# Patient Record
Sex: Female | Born: 1970 | Race: White | Hispanic: No | Marital: Married | Smoking: Never smoker
Health system: Southern US, Community
[De-identification: ages and names within clinical notes are randomized; demographics above are authoritative.]

## PROBLEM LIST (undated history)

## (undated) DIAGNOSIS — G43909 Migraine, unspecified, not intractable, without status migrainosus: Secondary | ICD-10-CM

## (undated) DIAGNOSIS — E7800 Pure hypercholesterolemia, unspecified: Secondary | ICD-10-CM

## (undated) DIAGNOSIS — O139 Gestational [pregnancy-induced] hypertension without significant proteinuria, unspecified trimester: Secondary | ICD-10-CM

## (undated) DIAGNOSIS — F32A Depression, unspecified: Secondary | ICD-10-CM

## (undated) DIAGNOSIS — K219 Gastro-esophageal reflux disease without esophagitis: Secondary | ICD-10-CM

## (undated) DIAGNOSIS — R112 Nausea with vomiting, unspecified: Secondary | ICD-10-CM

## (undated) DIAGNOSIS — K529 Noninfective gastroenteritis and colitis, unspecified: Secondary | ICD-10-CM

## (undated) DIAGNOSIS — Z9889 Other specified postprocedural states: Secondary | ICD-10-CM

## (undated) DIAGNOSIS — M1990 Unspecified osteoarthritis, unspecified site: Secondary | ICD-10-CM

## (undated) DIAGNOSIS — F419 Anxiety disorder, unspecified: Secondary | ICD-10-CM

## (undated) DIAGNOSIS — Z973 Presence of spectacles and contact lenses: Secondary | ICD-10-CM

## (undated) HISTORY — DX: Migraine, unspecified, not intractable, without status migrainosus: G43.909

## (undated) HISTORY — DX: Noninfective gastroenteritis and colitis, unspecified: K52.9

## (undated) HISTORY — DX: Anxiety disorder, unspecified: F41.9

## (undated) HISTORY — PX: COLONOSCOPY: SHX174

## (undated) HISTORY — DX: Gestational (pregnancy-induced) hypertension without significant proteinuria, unspecified trimester: O13.9

## (undated) HISTORY — PX: CATARACT EXTRACTION: SUR2

## (undated) HISTORY — DX: Depression, unspecified: F32.A

## (undated) HISTORY — DX: Pure hypercholesterolemia, unspecified: E78.00

## (undated) HISTORY — PX: MOUTH SURGERY: SHX715

---

## 2001-10-08 ENCOUNTER — Other Ambulatory Visit: Admission: RE | Admit: 2001-10-08 | Discharge: 2001-10-08 | Payer: Self-pay | Admitting: Obstetrics and Gynecology

## 2002-06-02 ENCOUNTER — Inpatient Hospital Stay (HOSPITAL_COMMUNITY): Admission: EM | Admit: 2002-06-02 | Discharge: 2002-06-03 | Payer: Self-pay | Admitting: *Deleted

## 2002-06-02 ENCOUNTER — Encounter: Payer: Self-pay | Admitting: *Deleted

## 2002-06-03 ENCOUNTER — Encounter: Payer: Self-pay | Admitting: Neurology

## 2002-06-03 ENCOUNTER — Encounter (INDEPENDENT_AMBULATORY_CARE_PROVIDER_SITE_OTHER): Payer: Self-pay | Admitting: Cardiology

## 2002-10-14 ENCOUNTER — Other Ambulatory Visit: Admission: RE | Admit: 2002-10-14 | Discharge: 2002-10-14 | Payer: Self-pay | Admitting: Obstetrics and Gynecology

## 2003-10-19 ENCOUNTER — Other Ambulatory Visit: Admission: RE | Admit: 2003-10-19 | Discharge: 2003-10-19 | Payer: Self-pay | Admitting: Obstetrics and Gynecology

## 2004-08-01 ENCOUNTER — Encounter (INDEPENDENT_AMBULATORY_CARE_PROVIDER_SITE_OTHER): Payer: Self-pay | Admitting: Specialist

## 2004-08-01 ENCOUNTER — Ambulatory Visit (HOSPITAL_COMMUNITY): Admission: RE | Admit: 2004-08-01 | Discharge: 2004-08-01 | Payer: Self-pay | Admitting: Gastroenterology

## 2004-12-16 ENCOUNTER — Other Ambulatory Visit: Admission: RE | Admit: 2004-12-16 | Discharge: 2004-12-16 | Payer: Self-pay | Admitting: Obstetrics and Gynecology

## 2005-10-22 ENCOUNTER — Encounter (INDEPENDENT_AMBULATORY_CARE_PROVIDER_SITE_OTHER): Payer: Self-pay | Admitting: *Deleted

## 2005-10-22 ENCOUNTER — Inpatient Hospital Stay (HOSPITAL_COMMUNITY): Admission: AD | Admit: 2005-10-22 | Discharge: 2005-10-25 | Payer: Self-pay | Admitting: Gynecology

## 2005-12-05 ENCOUNTER — Other Ambulatory Visit: Admission: RE | Admit: 2005-12-05 | Discharge: 2005-12-05 | Payer: Self-pay | Admitting: Gynecology

## 2005-12-15 HISTORY — PX: INTRAUTERINE DEVICE INSERTION: SHX323

## 2006-12-21 ENCOUNTER — Other Ambulatory Visit: Admission: RE | Admit: 2006-12-21 | Discharge: 2006-12-21 | Payer: Self-pay | Admitting: Gynecology

## 2007-12-27 ENCOUNTER — Other Ambulatory Visit: Admission: RE | Admit: 2007-12-27 | Discharge: 2007-12-27 | Payer: Self-pay | Admitting: Gynecology

## 2007-12-27 ENCOUNTER — Ambulatory Visit: Payer: Self-pay | Admitting: Gynecology

## 2007-12-27 ENCOUNTER — Encounter: Payer: Self-pay | Admitting: Gynecology

## 2008-01-27 ENCOUNTER — Ambulatory Visit: Payer: Self-pay | Admitting: Gynecology

## 2009-01-08 ENCOUNTER — Ambulatory Visit: Payer: Self-pay | Admitting: Gynecology

## 2009-01-08 ENCOUNTER — Other Ambulatory Visit: Admission: RE | Admit: 2009-01-08 | Discharge: 2009-01-08 | Payer: Self-pay | Admitting: Gynecology

## 2009-01-08 ENCOUNTER — Encounter: Payer: Self-pay | Admitting: Gynecology

## 2009-01-22 ENCOUNTER — Encounter: Admission: RE | Admit: 2009-01-22 | Discharge: 2009-01-22 | Payer: Self-pay | Admitting: Gynecology

## 2010-01-11 ENCOUNTER — Ambulatory Visit: Payer: Self-pay | Admitting: Gynecology

## 2010-01-11 ENCOUNTER — Other Ambulatory Visit: Admission: RE | Admit: 2010-01-11 | Discharge: 2010-01-11 | Payer: Self-pay | Admitting: Gynecology

## 2010-02-24 ENCOUNTER — Encounter
Admission: RE | Admit: 2010-02-24 | Discharge: 2010-02-24 | Payer: Self-pay | Source: Home / Self Care | Attending: Gynecology | Admitting: Gynecology

## 2010-07-29 NOTE — H&P (Signed)
Rebekah Silva, Rebekah Silva               ACCOUNT NO.:  [id number]   MEDICAL RECORD NO.:  [id number]          PATIENT TYPE:  INP   LOCATION:  9198                          FACILITY:  [facility]   PHYSICIAN:  Juan H. Toney Rakes, M.D.DATE OF BIRTH:  [birth year]   DATE OF ADMISSION:  10/22/2005  DATE OF DISCHARGE:                                HISTORY & PHYSICAL   CHIEF COMPLAINT:  1. Questionable rupture of membranes.  2. Labile pregnancy-induced hypertension.   HISTORY:  The patient is a 40 year old gravida 1 para 0 with date of  confinement of October 22, 2005, currently [redacted] weeks gestation.  The patient  presented to the office today questioning whether she had ruptured membranes  or not.  She felt wet last night and this morning.  She was kept in  maternity admission.  Her Nitrazine and ferning were negative.  She had a  reassuring fetal heart rate tracing and mild uterine irritability, but was  found to have a blood pressure of 145/95 and repeated on several occasions  it was in the 546T and the diastolic was into the 03T.  The patient on a  prenatal visit and had fluctuations in her blood pressure and in the  emergency room she had 1+ pitting edema with DTR 1+ to 2+.  The patient  denied any visual disturbances or headaches.  Her CBC and __________  panels  were within normal limits and her urine did not demonstrate any evidence of  proteinuria.  The patient with known history of ulcerative colitis and takes  Asacol daily which is a category B in her pregnancy.  The patient had an  ultrasound at [redacted] weeks gestation, whereby the fetus at that point was  measuring 6-1/2 pounds.  She had a questionable narrow pelvis on pelvimetry.  The patient would like to try natural childbirth.  She will be admitted and  undergo induction.  Her cervix was long, closed, and posterior.  We will  place the Cervidil starting this afternoon for 12 hours followed by pitocin  drip high dose.   PRENATAL COURSE:  The  patient with a history of ulcerative colitis takes  Asacol daily.  She is on prenatal vitamins.  She also had iron-deficiency  anemia for which she was taken iron tablets.  The patient received RhoGAM at  [redacted] weeks gestation.  Sister with history of Down syndrome, the patient had a  normal first trimester screen and __________ fetoprotein but declined an  amniocentesis.  She had a normal cystic fibrosis screening.  The remainder  of her pregnancy was essentially unremarkable with the exception of labor in  the third trimester with labile PIH.  GBS culture was negative.   REVIEW OF SYSTEMS:  The __________ .   PHYSICAL EXAMINATION:  VITAL SIGNS:  Blood pressure was 145/95, 132/80 in  labor and delivery.  Temperature 98.5, pulse 93, respirations 20.  HEENT:  Unremarkable.  NECK:  Supple.  Trachea midline.  No carotid bruits.  No thyromegaly.  LUNGS:  Clear to auscultation without any rhonchi or wheezes.  HEART:  Regular rate and rhythm.  No murmurs or gallops.  BREASTS EXAM:  Not done.  ABDOMEN:  Gravid uterus, vertex presentation by [employer].  PELVIC EXAM:  Cervix long, closed, and posterior.  Negative Nitrazine,  negative ferning.  No fluid in the vaginal vault.  EXTREMITIES:  1+, DTRs 1+ to 2+, negative __________ .   PRENATAL LABS:  B negative blood type.  VDRL was nonreactive.  Rubella  immune.  Hepatitis C surface antigen and HIV were negative.  Alpha-  fetoprotein was normal.  Diabetes screen was normal.  First trimester screen  was normal.  Cystic fibrosis screening was normal for the majority of the  mutation screen.  The patient declined an amniocentesis.  GBS culture was  negative.   ASSESSMENT:  A 40 year old gravida 1, para 0 at [redacted] weeks gestation.  We  thought that she had ruptured her membranes.  Nitrazine and ferning were  negative.  Cervix long closed and posterior.  The patient now at [redacted] weeks  gestation with labile PIH, normal PIH labs in the emergency room,  reassuring  fetal heart rate tracing.  Uterine irritability noted.  The patient will be  admitted.  Will place Cervidil around the cervix for cervical ripening for a  12-hour period, followed by Pitocin augmentation.  The risks, benefits, and  pros-and-cons were discussed with the patient.  Her GBS culture was  negative.   PLAN:  As per assessment above.      Juan H. Toney Rakes, M.D.  Electronically Signed     JHF/MEDQ  D:  10/22/2005  T:  10/22/2005  Job:  [id number]

## 2010-07-29 NOTE — Op Note (Signed)
Rebekah Silva, Rebekah Silva               ACCOUNT NO.:  [id number]   MEDICAL RECORD NO.:  [id number]          PATIENT TYPE:  AMB   LOCATION:  ENDO                         FACILITY:  [facility]   PHYSICIAN:  Jeryl Columbia, M.D.    DATE OF BIRTH:  06-05-1970   DATE OF PROCEDURE:  08/01/2004  DATE OF DISCHARGE:                                 OPERATIVE REPORT   PROCEDURE:  Colonoscopy with biopsy.   INDICATION:  __________ increased colitis symptoms not responding to the  usual medicines.  Consent was signed after risks, benefits, methods, options  thoroughly discussed in the office.   MEDICINES USED:  Demerol 100, Versed 10.   PROCEDURE:  Rectal inspection was pertinent for external hemorrhoids.  Small  digital exam is negative.  Video pediatric adjustable colonoscope was  inserted and easily advanced around the colon to the cecum.  On insertion,  distal rectal was spared.  The left side to about the distal transverse was  mild to moderately inflamed.  The right side was normal.  Advancing to cecum  did not require any abdominal pressure or any position changes.  The scope  was inserted a short ways into the terminal ileum, which was normal.  Photo  documentation was obtained.  A few scattered GI biopsies were obtained and  put in the first container.  A few scattered right-sided normal biopsies  were obtained and put in the second container.  The prep was adequate.  There was some liquid stool that required suctioning only.  The scope was  withdrawn around the left side.  Mild to moderate inflammation was confirmed  and a few scattered biopsies were obtained and put in the third container.  Anal rectal pull-through and retroflexion back in the rectum confirmed the  normal distal rectum but mildly involved proximal rectum.  There were some  small hemorrhoids.  The scope was straightened and re-advanced a short ways  up the left side of the colon.  Air was suctioned.  Scope removed.  The  patient  tolerated the procedure well.  There was no obvious immediate  complication.   ENDOSCOPIC DIAGNOSES:  1.  Internal/external small hemorrhoids.  2.  Rectal sparing distally.  3.  Left-sided colitis to the distal transverse status post biopsy, mild to      moderate.  4.  Otherwise within normal limits to the terminal ileum status post biopsy      in both the TI and the right-sided normal appearing colon.   PLAN:  Probably need to start prednisone.  Will begin 20, then based on how  she does can increase or decrease.  Might consider enemas in the future.  Go  ahead and get a CBC today to see if she needs more iron, etc., and decide  followup based on how she is doing on the prednisone and when we review the  biopsies.      MEM/MEDQ  D:  08/01/2004  T:  08/01/2004  Job:  [id number]   cc:   Emeline General. White, M.D.  [place] [place]  [address]  Fax: [phone]

## 2010-07-29 NOTE — Discharge Summary (Signed)
[name], Rebekah Silva               ACCOUNT NO.:  [id number]   MEDICAL RECORD NO.:  [id number]          PATIENT TYPE:  INP   LOCATION:  9126                          FACILITY:  [facility]   PHYSICIAN:  Juan H. Toney Rakes, M.D.DATE OF BIRTH:  1970/05/14   DATE OF ADMISSION:  10/22/2005  DATE OF DISCHARGE:                                 DISCHARGE SUMMARY   HISTORY:  The patient is a 40 year old gravida 1, para 0 who was admitted on  August 12 with the diagnosis of questionable rupture of membranes and labile  pregnancy induced hypertension.  The patient was subsequently taken for  primary cesarean section secondary to prolonged rupture of membranes and  suspected chorioamnionitis and persistent fetal tachycardia during the first  stage of labor.  The patient delivered a viable female infant, Apgars of  08/09, arterial cord pH was 7.20, weight of 6 pounds 7 ounces.  Nuchal cord  x2 was noted as well as a true knot.  The patient had normal PIH labs  preoperatively as well as postoperatively and she had been asymptomatic as  well.  Her postoperative day #1 hemoglobin was 12.3, platelet count 280,000,  blood type was B negative.  28 Rh was negative as well and maternal  rubella immunity was present.  The patient continued to do well with the  exception that her blood pressures ranged from 540 to 981 systolic and  diastolics were running in the 90-98 range.  She had 1+ pitting edema.  DTR  1+ but no clonus, no right upper quadrant pain.  Her incision was intact.  She continued to ambulate and was voiding well and tolerating regular diet  well, passing gas and she was ready to be discharged home.  She was started  on labetalol 100 mg b.i.d., took 1 tablet before discharge from the  hospital.  She will return to the office in another week to have her staples  removed and for blood pressure check.   FINAL DIAGNOSES:  1. Postdate pregnancy.  2. Prolonged rupture of membrane.  3. Suspected  chorioamnionitis.  4. Persistent fetal tachycardia  5. Labile pregnancy induced hypertension.   FINAL DISPOSITION:  The patient was ready to be discharged home on the third  postoperative day.  She was started on labetalol 100 mg p.o. b.i.d. Her  incision was intact.  She was up and ambulating, tolerating regular diet  well scheduled to return to the office in a week to have her staples removed  and for blood pressure monitoring, signs and symptoms of preeclampsia  superimposed on chronic hypertension was discussed.  She was given a  prescription of Darvocet to take one p.o. q.4-6 h p.r.n. pain and she will  continue prenatal vitamins as well.      Juan H. Toney Rakes, M.D.  Electronically Signed     JHF/MEDQ  D:  10/25/2005  T:  10/25/2005  Job:  [id number]

## 2010-07-29 NOTE — Op Note (Signed)
Rebekah Silva, Rebekah Silva               ACCOUNT NO.:  [id number]   MEDICAL RECORD NO.:  [id number]          PATIENT TYPE:  INP   LOCATION:  9126                          FACILITY:  [facility]   PHYSICIAN:  Juan H. Toney Rakes, M.D.DATE OF BIRTH:  04/20/1970   DATE OF PROCEDURE:  10/22/2005  DATE OF DISCHARGE:                                 OPERATIVE REPORT   SURGEON:  Juan H. Toney Rakes, M.D.   INDICATIONS FOR OPERATION:  A 40 year old gravida 1, para 0, at 65 weeks'  gestation is being taken to the operating room secondary to postdates with  prolonged rupture of membranes, suspected chorioamnionitis, persistent fetal  tachycardia, in the early first stage of labor.   PREOPERATIVE DIAGNOSES:  1. Post date pregnancy.  2. Prolonged rupture membranes.  3. Suspected chorioamnionitis.  4. Oligohydramnios, suspected.  5. Persistent fetal tachycardia (early first stage of labor).   POSTOPERATIVE DIAGNOSES:  1. Post date pregnancy.  2. Prolonged rupture membranes.  3. Suspected chorioamnionitis.  4. Oligohydramnios, suspected.  5. Persistent fetal tachycardia (early first stage of labor).  6. Meconium-stained amniotic fluid.  7. Nuchal cord x2, and a true knot.   ANESTHESIA:  Spinal.   PROCEDURE PERFORMED:  Primary lower uterine segment transverse cesarean  section.   FINDINGS:  Meconium stained amniotic fluid, although minimal; female at Apgars  of 8 and 8; arterial cord pH 7.20; weight 6 pounds 7 ounces; nuchal cord x2  along with a true knot; normal maternal pelvic anatomy.   DESCRIPTION OF OPERATION:  After the patient was adequately counseled, she  was taken to the operating room where she underwent a successful spinal  anesthesia.  A Foley catheter was placed for monitoring of the urinary  output.  The abdomen was prepped and draped in the usual sterile fashion.  A  Pfannenstiel skin incision was made 2 cm above the symphysis pubis.  The  incision was carried down from the skin, through  the subcutaneous tissue,  down to rectus fascia; whereby a midline nick was made.  The fascia was  incised in a transverse fashion.  The midline raphe was entered.   The peritoneal cavity was entered cautiously; and the bladder flap was  established.  The lower uterine segment was incised in a transverse fashion.  Meconium-stained amniotic fluid was present.  The newborn's head was  delivered after the nuchal cord x2 was reduced; and, at this point, then it  was noted also that it was a true knot.  The nasopharyngeal area was DeLee  suctioned with no meconium fluid suctioned.  The newborn was delivered.  The  cord was doubly clamped and excised.  The newborn gave a cry and was shown  to the parents; and passed off to the neonatologist who gave the above-  mentioned parameters.  After cord blood was obtained.  The placenta was  delivered from the intrauterine cavity.  The patient was then donating her  cord blood to public cord banking; and then afterwards the placenta and cord  was first to be submitted to pathology for histological evaluation.   Pitocin drip was started.  The patient had received clindamycin 900 mg IV  since she is allergic to PENICILLIN.  The uterus was exteriorized.  The  intrauterine cavity was swept clear of remaining products of conception.  The intrauterine cavity was irrigated with normal saline solution.  The  transverse incision was closed in a two-layer fashion.  The first with an  interlocking stitch of #0 Vicryl suture, followed by an imbricating layer  with a similar suture material.  The uterus was placed back in the pelvic  cavity.  The pelvic cavity was copiously irrigated with normal saline  solution.  Sponge count and needle count were correct.  The visceral  peritoneum was now reapproximated; and the rectus fascia was closed with a  running stitch of #0 Vicryl suture.  The subcutaneous bleeders were Bovie  cauterized and the skin was reapproximated  with staples followed by placing  Xeroform gauze and 4 x 4 dressing.  The patient was transferred to recovery  room with stable vital signs.  Blood loss for the procedure was recorded at  700 mL.  IV fluid 2300 mL of lactated Ringer's; and urine output was 200 mL  and clear.      Juan H. Toney Rakes, M.D.  Electronically Signed     JHF/MEDQ  D:  10/22/2005  T:  10/23/2005  Job:  [id number]

## 2010-07-29 NOTE — H&P (Signed)
[name], Rebekah Silva                           ACCOUNT NO.:  [id number]   MEDICAL RECORD NO.:  [id number]                   PATIENT TYPE:  EMS   LOCATION:  MAJO                                 FACILITY:  [facility]   PHYSICIAN:  Larey Seat, M.D.               DATE OF BIRTH:  07-31-1970   DATE OF ADMISSION:  06/02/2002  DATE OF DISCHARGE:                                HISTORY & PHYSICAL   HISTORY OF PRESENT ILLNESS:  This is an obese 40 year old right-handed  married female with a birth date 09/07/1970. She is presenting to the  ER at [facility] after having  had 3 episodes of dysarthria, right-  sided facial drooling, numbness feeling and neck pain as well as with a  third episode some arm tingling numbness. 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   She said the 3rd episode happened around the time when she drove home, and  she therefore decided to pick her husband up who then drove her to the  hospital. This episode had also slurred speech associated with it and right  arm tingling and facial numbness. She also noticed hypersalivation and had  trouble too handling secretions. Here she suffered a very severe left-sided  headache just 10 minutes ago after I had admitted her to the stroke service  for a TIA workup, which makes the differential diagnosis of migraine a  possibility.   PAST MEDICAL HISTORY:  1. Hypertension.  2. Hypercholesteremia.  3. Obesity.  4. Occasional sharp headaches that have not had migrainous character, last     only seconds and are not associated with nausea or photophobia.   MEDICATIONS:  The patient does not take any regular medication except  hormonal birth control pills. She has not taken aspirin or multivitamins.   SOCIAL HISTORY:  She is married, a Engineer, civil (consulting) and  middle school. She has no children yet  and denies any use of alcohol,  nicotine or illegal drugs.   FAMILY HISTORY:  She states that her family history is positive for  hypertension and cholesterolemia, but she is not aware of any heart  condition or strokes or hypercoagulable disease.   REVIEW OF SYSTEMS:  She has recently started the Atkins diet and has lost 25  pounds in the last 4 months, and she also believes that it had benefitted  her cholesterol levels, although she does not recall the numbers. Mental  status, the patient is alert and cooperative, very pleasant.   PHYSICAL EXAMINATION:  HEENT:  Her general physical examination shows the  mucous membranes to be well perfused. Pupils equally round and reactive to  light and accommodation. Normocephalic, atraumatic skull.  VITAL SIGNS:  Blood pressure 103/70, heart rate 68, respiratory rate 16.  LUNGS:  Clear to auscultation.  ABDOMEN:  Soft, nontender, nondistended. Positive bowel sounds.  EXTREMITIES:  No peripheral edema, no pitting edema. No clubbing or  cyanosis.  SKIN:  No rash, no hypo or hyperpigmentation, no dysarthria, apraxia,  dysphagia.  NEUROLOGIC:  Cranial nerves:  Pupils reactive equally to light and  accommodation. Extraocular movements full. Optokinetic responses intact.  Equal facial strength and sensory. No deviation of tongue or uvula. No  carotid bruits.  MOTOR EXAMINATION:  Equal strength tone and mass bilaterally with 2+ deep  tendon reflexes, downgoing toes to plantar stimulation. No clonus and no  pronator drift. The fine motor skills are preserved. The patient has equal  primary and cortical sensory. Her finger-to-nose test was intact without  tremor, ataxia or dysmetria.   ASSESSMENT:  Either transient ischemic attack or migraine with an entirely  new  manifestation for this patient, who never suffered from migrainous  headaches before.   PLAN:  Admit for MRI with MRA. Add aspirin 325 mg. Start multivitamin with  folic acid and a low  cholesterol diet. I will review result of the MRI/MRA  of the brain and MRA of the neck to see if there is the necessity to do any  carotid Doppler studies which I trust the patient's age and her absence of  any carotid bruits is probably not likely. A 2D echocardiogram might also  not be necessary. I have discussed with her the possibility of migraine  prophylaxis as she states that she has headaches, nonmigrainous about every  14 days, and we might be able to control these kind of headaches with  preventive medication.                                               Larey Seat, M.D.    CD/MEDQ  D:  06/02/2002  T:  06/03/2002  Job:  [id number]

## 2011-01-10 ENCOUNTER — Encounter: Payer: Self-pay | Admitting: Anesthesiology

## 2011-01-13 ENCOUNTER — Other Ambulatory Visit (HOSPITAL_COMMUNITY)
Admission: RE | Admit: 2011-01-13 | Discharge: 2011-01-13 | Disposition: A | Discharge status: Home / Self Care | Payer: BC Managed Care – PPO | Source: Ambulatory Visit | Attending: Gynecology | Admitting: Gynecology

## 2011-01-13 ENCOUNTER — Ambulatory Visit (INDEPENDENT_AMBULATORY_CARE_PROVIDER_SITE_OTHER): Payer: BC Managed Care – PPO | Admitting: Gynecology

## 2011-01-13 ENCOUNTER — Encounter: Payer: Self-pay | Admitting: Gynecology

## 2011-01-13 VITALS — BP 128/84 | Ht 66.5 in | Wt 286.0 lb

## 2011-01-13 DIAGNOSIS — Z01419 Encounter for gynecological examination (general) (routine) without abnormal findings: Secondary | ICD-10-CM

## 2011-01-13 DIAGNOSIS — E7800 Pure hypercholesterolemia, unspecified: Secondary | ICD-10-CM

## 2011-01-13 DIAGNOSIS — E663 Overweight: Secondary | ICD-10-CM

## 2011-01-13 DIAGNOSIS — Z23 Encounter for immunization: Secondary | ICD-10-CM

## 2011-01-13 NOTE — Progress Notes (Signed)
Addended by: Su Grand A on: 01/13/2011 03:23 PM   Modules accepted: Orders

## 2011-01-13 NOTE — Progress Notes (Signed)
Rebekah Silva 1970-07-31 [id number]   History:    40 y.o.  for annual exam and also she is due to have her Mirena IUD changed since it was placed in 2007. She does her monthly self breast examination she is due for a mammogram next month. She's been followed by Dr. Harlan Stains her primary physician who is been monitoring her hypercholesterolemia. She scheduled to see her at the end of this month and labs will be drawn at her office. She's having very light if any cycles and all the Mirena IUD has worked well for her. She continues to be overweight with a BMI over 35. In 2000 and and 7 a bone density study had been done because she had history of ulcerative colitis and had been on chronic steroids for a while and her bone mineral density study was normal. She is no longer on any chronic steroids. She is taking her calcium and vitamin D once a day.  Past medical history,surgical history, family history and social history were all reviewed and documented in the EPIC chart.  ROS:  Was performed and pertinent positives and negatives are included in the history.  Exam: chaperone present BP 128/84  Ht 5' 6.5" (1.689 m)  Wt 286 lb (129.729 kg)  BMI 45.47 kg/m2  Body mass index is 45.47 kg/(m^2).  General appearance : Well developed well nourished female. No acute distress HEENT: Neck supple, trachea midline, no carotid bruits, no thyroidmegaly Lungs: Clear to auscultation, no rhonchi or wheezes, or rib retractions  Heart: Regular rate and rhythm, no murmurs or gallops Breast:Examined in sitting and supine position were symmetrical in appearance, no palpable masses or tenderness,  no skin retraction, no nipple inversion, no nipple discharge, no skin discoloration, no axillary or supraclavicular lymphadenopathy Abdomen: no palpable masses or tenderness, no rebound or guarding Extremities: no edema or skin discoloration or tenderness  Pelvic:  Bartholin, Urethra, Skene Glands: Within normal  limits             Vagina: No gross lesions or discharge  Cervix: No gross lesions or discharge IUD string seen  Uterus   anteverted, normal size, shape and consistency, non-tender and mobile  Adnexa  Without masses or tenderness  Anus and perineum  normal   Rectovaginal  normal sphincter tone without palpated masses or tenderness             Hemoccult not done     Assessment/Plan:  40 y.o. female for annual exam will schedule followup visit next week to replace her Mirena IUD with a new one. She will followup with Dr. Harlan Stains for medical exam lab work. We discussed importance of exercise 3-4 times a week. She requested to have the flu shot today which will be administered after consent form signed. She was encouraged to continue her monthly self breast examination. I have requested her to increase her calcium and vitamin D to twice a day. Pap smear was done today.    Terrance Mass MD, 2:42 PM 01/13/2011

## 2011-01-16 ENCOUNTER — Other Ambulatory Visit: Payer: Self-pay | Admitting: Gynecology

## 2011-01-16 ENCOUNTER — Other Ambulatory Visit: Payer: Self-pay | Admitting: *Deleted

## 2011-01-16 DIAGNOSIS — Z1231 Encounter for screening mammogram for malignant neoplasm of breast: Secondary | ICD-10-CM

## 2011-01-16 DIAGNOSIS — Z3049 Encounter for surveillance of other contraceptives: Secondary | ICD-10-CM

## 2011-01-16 MED ORDER — LEVONORGESTREL 20 MCG/24HR IU IUD: INTRAUTERINE_SYSTEM | Freq: Once | INTRAUTERINE | Status: DC

## 2011-01-16 NOTE — Progress Notes (Signed)
Patient informed benefits Mirena remove/insert $30 copay.  Patient scheduled.

## 2011-01-23 ENCOUNTER — Ambulatory Visit (INDEPENDENT_AMBULATORY_CARE_PROVIDER_SITE_OTHER): Payer: BC Managed Care – PPO | Admitting: Gynecology

## 2011-01-23 ENCOUNTER — Encounter: Payer: Self-pay | Admitting: Gynecology

## 2011-01-23 VITALS — BP 110/78

## 2011-01-23 DIAGNOSIS — Z3049 Encounter for surveillance of other contraceptives: Secondary | ICD-10-CM

## 2011-01-23 NOTE — Progress Notes (Signed)
Patient presented to the office today to remove her IUD (Mirena) his pin 5 years. Patient was seen in the office recently for her annual exam her Pap smear was normal. Patient read that printout information the Mirena IUD consent form signed patient aware that this form of contraception is good for 5 years is 99% effective.  Procedure note: Bimanual examination demonstrated upper limits of normal anteverted uterus no palpable adnexal masses. The cervix was cleansed with Betadine solution. The IUD string was grasped with a [place] clamp and the IUD was retrieved shown to the patient and discarded. A single-tooth tenaculum was then placed on the anterior cervical lip the uterus sounded to 8 cm and the Mirena IUD was placed in sterile fashion and a single-tooth tenaculum was removed. Patient tolerated procedure well. Patient will return back in one month for followup.

## 2011-02-20 ENCOUNTER — Ambulatory Visit: Payer: BC Managed Care – PPO | Admitting: Gynecology

## 2011-02-21 ENCOUNTER — Ambulatory Visit: Payer: BC Managed Care – PPO | Admitting: Gynecology

## 2011-02-24 ENCOUNTER — Encounter: Payer: Self-pay | Admitting: Gynecology

## 2011-02-24 ENCOUNTER — Ambulatory Visit (INDEPENDENT_AMBULATORY_CARE_PROVIDER_SITE_OTHER): Payer: BC Managed Care – PPO | Admitting: Gynecology

## 2011-02-24 VITALS — BP 128/84

## 2011-02-24 DIAGNOSIS — Z30431 Encounter for routine checking of intrauterine contraceptive device: Secondary | ICD-10-CM

## 2011-02-24 NOTE — Patient Instructions (Signed)
Will see you next year. Remember to do your monthly breast exams. Happy Holidays!

## 2011-02-24 NOTE — Progress Notes (Signed)
a patient 40 year old gravida 1 para 1 presented to the office today for one month followup after having placed a Mirena IUD which had been changed since the previous one had expired. She's having no complaints.  Exam: Abdomen: Soft nontender no rebound or guarding Pelvic: Bartholin urethra Skene was within normal limits Vagina: No gross lesions on inspection or discharge Cervix: IUD string seen Uterus: Anteverted normal size shape and consistency adnexa: No masses or tenderness  IUD string was trimmed.  Assessment/plan: One month status post placement of Mirena IUD. Patient fully where this form of contraception is good for 5 years and is 99% effective. Doing well will followup in one year or when necessary.

## 2011-03-08 ENCOUNTER — Ambulatory Visit
Admission: RE | Admit: 2011-03-08 | Discharge: 2011-03-08 | Disposition: A | Discharge status: Home / Self Care | Payer: BC Managed Care – PPO | Source: Ambulatory Visit | Attending: Gynecology | Admitting: Gynecology

## 2011-03-08 DIAGNOSIS — Z1231 Encounter for screening mammogram for malignant neoplasm of breast: Secondary | ICD-10-CM

## 2012-04-04 ENCOUNTER — Other Ambulatory Visit: Payer: Self-pay | Admitting: Gastroenterology

## 2012-08-20 ENCOUNTER — Encounter: Payer: Self-pay | Admitting: Gynecology

## 2012-08-20 ENCOUNTER — Ambulatory Visit (INDEPENDENT_AMBULATORY_CARE_PROVIDER_SITE_OTHER): Payer: BC Managed Care – PPO | Admitting: Gynecology

## 2012-08-20 VITALS — BP 126/82 | Ht 66.0 in | Wt 281.0 lb

## 2012-08-20 DIAGNOSIS — IMO0002 Reserved for concepts with insufficient information to code with codable children: Secondary | ICD-10-CM

## 2012-08-20 DIAGNOSIS — K5190 Ulcerative colitis, unspecified, without complications: Secondary | ICD-10-CM

## 2012-08-20 DIAGNOSIS — Z01419 Encounter for gynecological examination (general) (routine) without abnormal findings: Secondary | ICD-10-CM

## 2012-08-20 NOTE — Patient Instructions (Addendum)
Tetanus, Diphtheria, Pertussis (Tdap) Vaccine What You Need to Know WHY GET VACCINATED? Tetanus, diphtheria and pertussis can be very serious diseases, even for adolescents and adults. Tdap vaccine can protect [place] from these diseases. TETANUS (Lockjaw) causes painful muscle tightening and stiffness, usually all over the body.  It can lead to tightening of muscles in the head and neck so you can't open your mouth, swallow, or sometimes even breathe. Tetanus kills about 1 out of 5 people who are infected. DIPHTHERIA can cause a thick coating to form in the back of the throat.  It can lead to breathing problems, paralysis, heart failure, and death. PERTUSSIS (Whooping Cough) causes severe coughing spells, which can cause difficulty breathing, vomiting and disturbed sleep.  It can also lead to weight loss, incontinence, and rib fractures. Up to 2 in 100 adolescents and 5 in 100 adults with pertussis are hospitalized or have complications, which could include pneumonia and death. These diseases are caused by bacteria. Diphtheria and pertussis are spread from person to person through coughing or sneezing. Tetanus enters the body through cuts, scratches, or wounds. Before vaccines, the [place] saw as many as 200,000 cases a year of diphtheria and pertussis, and hundreds of cases of tetanus. Since vaccination began, tetanus and diphtheria have dropped by about 99% and pertussis by about 80%. TDAP VACCINE Tdap vaccine can protect adolescents and adults from tetanus, diphtheria, and pertussis. One dose of Tdap is routinely given at age 19 or 11. People who did not get Tdap at that age should get it as soon as possible. Tdap is especially important for health care professionals and anyone having close contact with a baby younger than 12 months. Pregnant women should get a dose of Tdap during every pregnancy, to protect the newborn from pertussis. Infants are most at risk for severe, life-threatening  complications from pertussis. A similar vaccine, called Td, protects from tetanus and diphtheria, but not pertussis. A Td booster should be given every 10 years. Tdap may be given as one of these boosters if you have not already gotten a dose. Tdap may also be given after a severe cut or burn to prevent tetanus infection. Your doctor can give you more information. Tdap may safely be given at the same time as other vaccines. SOME PEOPLE SHOULD NOT GET THIS VACCINE  If you ever had a life-threatening allergic reaction after a dose of any tetanus, diphtheria, or pertussis containing vaccine, OR if you have a severe allergy to any part of this vaccine, you should not get Tdap. Tell your doctor if you have any severe allergies.  If you had a coma, or long or multiple seizures within 7 days after a childhood dose of DTP or DTaP, you should not get Tdap, unless a cause other than the vaccine was found. You can still get Td.  Talk to your doctor if you:  have epilepsy or another nervous system problem,  had severe pain or swelling after any vaccine containing diphtheria, tetanus or pertussis,  ever had Guillain-Barr Syndrome (GBS),  aren't feeling well on the day the shot is scheduled. RISKS OF A VACCINE REACTION With any medicine, including vaccines, there is a chance of side effects. These are usually mild and go away on their own, but serious reactions are also possible. Brief fainting spells can follow a vaccination, leading to injuries from falling. Sitting or lying down for about 15 minutes can help prevent these. Tell your doctor if you feel dizzy or light-headed, or  have vision changes or ringing in the ears. Mild problems following Tdap (Did not interfere with activities)  Pain where the shot was given (about 3 in 4 adolescents or 2 in 3 adults)  Redness or swelling where the shot was given (about 1 person in 5)  Mild fever of at least 100.40F (up to about 1 in 25 adolescents or 1 in  100 adults)  Headache (about 3 or 4 people in 10)  Tiredness (about 1 person in 3 or 4)  Nausea, vomiting, diarrhea, stomach ache (up to 1 in 4 adolescents or 1 in 10 adults)  Chills, body aches, sore joints, rash, swollen glands (uncommon) Moderate problems following Tdap (Interfered with activities, but did not require medical attention)  Pain where the shot was given (about 1 in 5 adolescents or 1 in 100 adults)  Redness or swelling where the shot was given (up to about 1 in 16 adolescents or 1 in 25 adults)  Fever over 102F (about 1 in 100 adolescents or 1 in 250 adults)  Headache (about 3 in 20 adolescents or 1 in 10 adults)  Nausea, vomiting, diarrhea, stomach ache (up to 1 or 3 people in 100)  Swelling of the entire arm where the shot was given (up to about 3 in 100). Severe problems following Tdap (Unable to perform usual activities, required medical attention)  Swelling, severe pain, bleeding and redness in the arm where the shot was given (rare). A severe allergic reaction could occur after any vaccine (estimated less than 1 in a million doses). WHAT IF THERE IS A SERIOUS REACTION? What should I look for?  Look for anything that concerns you, such as signs of a severe allergic reaction, very high fever, or behavior changes. Signs of a severe allergic reaction can include hives, swelling of the face and throat, difficulty breathing, a fast heartbeat, dizziness, and weakness. These would start a few minutes to a few hours after the vaccination. What should I do?  If you think it is a severe allergic reaction or other emergency that can't wait, call 9-1-1 or get the person to the nearest hospital. Otherwise, call your doctor.  Afterward, the reaction should be reported to the "Vaccine Adverse Event Reporting System" (VAERS). Your doctor might file this report, or you can do it yourself through the VAERS web site at www.vaers.SamedayNews.es, or by calling 434-638-0084. VAERS is  only for reporting reactions. They do not give medical advice.  THE NATIONAL VACCINE INJURY COMPENSATION PROGRAM The National Vaccine Injury Compensation Program (VICP) is a federal program that was created to compensate people who may have been injured by certain vaccines. Persons who believe they may have been injured by a vaccine can learn about the program and about filing a claim by calling (503)811-4628 or visiting the [place] website at GoldCloset.com.ee. HOW CAN I LEARN MORE?  Ask your doctor.  Call your local or state health department.  Contact the Centers for Disease Control and Prevention (CDC):  Call 902 670 1668 or visit CDC's website at http://hunter.com/. CDC Tdap Vaccine VIS (07/20/11) Document Released: 08/29/2011 Document Revised: 11/22/2011 Document Reviewed: 08/29/2011 ExitCare Patient Information 2014 [place].

## 2012-08-20 NOTE — Progress Notes (Signed)
Rebekah Silva September 29, 1970 [id number]   History:    42 y.o.  for annual gyn exam with no complaints today. Patient had a Zane Herald IUD placement 2012 minutes done well. She has minimal cycles of any. Patient with history of ulcerative colitis is on chronic steroid. She had been on and off for 2 years. She was recently restarted on her glucocorticoid called Uceris for which she takes 9 mg daily. Patient had a normal bone density study in 2009. Patient does her monthly self breast examination and is currently taking calcium and vitamin D twice a day and is exercising regularly. Review of her records indicated she was weighing 286 pounds last year and is weighing 281 pounds this year. Patient would no prior history of abnormal Pap smears. Patient believes she received the Tdap vaccine last year.  Past medical history,surgical history, family history and social history were all reviewed and documented in the EPIC chart.  Gynecologic History No LMP recorded. Patient is not currently having periods (Reason: IUD). Contraception: IUD Last Pap: 2012. Results were: normal Last mammogram: none indicated. Results were: none indicated  Obstetric History OB History   Grav Para Term Preterm Abortions TAB SAB Ect Mult Living   1 1 1       1      # Outc Date GA Lbr Len/2nd Wgt Sex Del Anes PTL Lv   1 TRM     M CS   Yes       ROS: A ROS was performed and pertinent positives and negatives are included in the history.  GENERAL: No fevers or chills. HEENT: No change in vision, no earache, sore throat or sinus congestion. NECK: No pain or stiffness. CARDIOVASCULAR: No chest pain or pressure. No palpitations. PULMONARY: No shortness of breath, cough or wheeze. GASTROINTESTINAL: No abdominal pain, nausea, vomiting or diarrhea, melena or bright red blood per rectum. GENITOURINARY: No urinary frequency, urgency, hesitancy or dysuria. MUSCULOSKELETAL: No joint or muscle pain, no back pain, no recent trauma. DERMATOLOGIC:  No rash, no itching, no lesions. ENDOCRINE: No polyuria, polydipsia, no heat or cold intolerance. No recent change in weight. HEMATOLOGICAL: No anemia or easy bruising or bleeding. NEUROLOGIC: No headache, seizures, numbness, tingling or weakness. PSYCHIATRIC: No depression, no loss of interest in normal activity or change in sleep pattern.     Exam: chaperone present  BP 126/82  Ht 5' 6"  (1.676 m)  Wt 281 lb (127.461 kg)  BMI 45.38 kg/m2  Body mass index is 45.38 kg/(m^2).  General appearance : Well developed well nourished female. No acute distress HEENT: Neck supple, trachea midline, no carotid bruits, no thyroidmegaly Lungs: Clear to auscultation, no rhonchi or wheezes, or rib retractions  Heart: Regular rate and rhythm, no murmurs or gallops Breast:Examined in sitting and supine position were symmetrical in appearance, no palpable masses or tenderness,  no skin retraction, no nipple inversion, no nipple discharge, no skin discoloration, no axillary or supraclavicular lymphadenopathy Abdomen: no palpable masses or tenderness, no rebound or guarding Extremities: no edema or skin discoloration or tenderness  Pelvic:  Bartholin, Urethra, Skene Glands: Within normal limits             Vagina: No gross lesions or discharge  Cervix: No gross lesions or discharge, IUD string seen  Uterus  anteverted, normal size, shape and consistency, non-tender and mobile  Adnexa  Without masses or tenderness  Anus and perineum  normal   Rectovaginal  normal sphincter tone without palpated masses or tenderness  Hemoccult none indicated     Assessment/Plan:  42 y.o. female for annual exam who is doing well. She is currently being treated for chronic ulcerative colitis and is on steroid. For this reason I would like her to have a bone density study today to compare with the study of 2009. Patient was encouraged to continue to do a regular exercise and to continue to take her calcium and  vitamin D. No Pap smear was done today the new guidelines were discussed. Next year she will be her baseline mammogram.    Terrance Mass MD, 3:38 PM 08/20/2012

## 2012-08-21 ENCOUNTER — Encounter: Payer: Self-pay | Admitting: Gynecology

## 2012-09-12 ENCOUNTER — Ambulatory Visit (INDEPENDENT_AMBULATORY_CARE_PROVIDER_SITE_OTHER): Payer: BC Managed Care – PPO

## 2012-09-12 DIAGNOSIS — Z13820 Encounter for screening for osteoporosis: Secondary | ICD-10-CM

## 2012-09-12 DIAGNOSIS — IMO0002 Reserved for concepts with insufficient information to code with codable children: Secondary | ICD-10-CM

## 2012-09-12 DIAGNOSIS — K5190 Ulcerative colitis, unspecified, without complications: Secondary | ICD-10-CM

## 2013-12-31 ENCOUNTER — Encounter: Payer: BC Managed Care – PPO | Admitting: Women's Health

## 2013-12-31 ENCOUNTER — Ambulatory Visit (INDEPENDENT_AMBULATORY_CARE_PROVIDER_SITE_OTHER): Payer: BC Managed Care – PPO | Admitting: Gynecology

## 2013-12-31 ENCOUNTER — Encounter: Payer: Self-pay | Admitting: Gynecology

## 2013-12-31 ENCOUNTER — Other Ambulatory Visit (HOSPITAL_COMMUNITY)
Admission: RE | Admit: 2013-12-31 | Discharge: 2013-12-31 | Disposition: A | Discharge status: Home / Self Care | Payer: BC Managed Care – PPO | Source: Ambulatory Visit | Attending: Gynecology | Admitting: Gynecology

## 2013-12-31 VITALS — BP 128/88 | Ht 66.0 in | Wt 290.0 lb

## 2013-12-31 DIAGNOSIS — T8389XA Other specified complication of genitourinary prosthetic devices, implants and grafts, initial encounter: Secondary | ICD-10-CM

## 2013-12-31 DIAGNOSIS — Z01419 Encounter for gynecological examination (general) (routine) without abnormal findings: Secondary | ICD-10-CM | POA: Insufficient documentation

## 2013-12-31 DIAGNOSIS — N951 Menopausal and female climacteric states: Secondary | ICD-10-CM

## 2013-12-31 DIAGNOSIS — Z1151 Encounter for screening for human papillomavirus (HPV): Secondary | ICD-10-CM | POA: Insufficient documentation

## 2013-12-31 DIAGNOSIS — T8332XA Displacement of intrauterine contraceptive device, initial encounter: Secondary | ICD-10-CM

## 2013-12-31 DIAGNOSIS — G44031 Episodic paroxysmal hemicrania, intractable: Secondary | ICD-10-CM

## 2013-12-31 NOTE — Patient Instructions (Addendum)
Perimenopause Perimenopause is the time when your body begins to move into the menopause (no menstrual period for 12 straight months). It is a natural process. Perimenopause can begin 2-8 years before the menopause and usually lasts for 1 year after the menopause. During this time, your ovaries may or may not produce an egg. The ovaries vary in their production of estrogen and progesterone hormones each month. This can cause irregular menstrual periods, difficulty getting pregnant, vaginal bleeding between periods, and uncomfortable symptoms. CAUSES  Irregular production of the ovarian hormones, estrogen and progesterone, and not ovulating every month.  Other causes include:  Tumor of the pituitary gland in the brain.  Medical disease that affects the ovaries.  Radiation treatment.  Chemotherapy.  Unknown causes.  Heavy smoking and excessive alcohol intake can bring on perimenopause sooner. SIGNS AND SYMPTOMS   Hot flashes.  Night sweats.  Irregular menstrual periods.  Decreased sex drive.  Vaginal dryness.  Headaches.  Mood swings.  Depression.  Memory problems.  Irritability.  Tiredness.  Weight gain.  Trouble getting pregnant.  The beginning of losing bone cells (osteoporosis).  The beginning of hardening of the arteries (atherosclerosis). DIAGNOSIS  Your health care provider will make a diagnosis by analyzing your age, menstrual history, and symptoms. He or she will do a physical exam and note any changes in your body, especially your female organs. Female hormone tests may or may not be helpful depending on the amount of female hormones you produce and when you produce them. However, other hormone tests may be helpful to rule out other problems. TREATMENT  In some cases, no treatment is needed. The decision on whether treatment is necessary during the perimenopause should be made by you and your health care provider based on how the symptoms are affecting you  and your lifestyle. Various treatments are available, such as:  Treating individual symptoms with a specific medicine for that symptom.  Herbal medicines that can help specific symptoms.  Counseling.  Group therapy. HOME CARE INSTRUCTIONS   Keep track of your menstrual periods (when they occur, how heavy they are, how long between periods, and how long they last) as well as your symptoms and when they started.  Only take over-the-counter or prescription medicines as directed by your health care provider.  Sleep and rest.  Exercise.  Eat a diet that contains calcium (good for your bones) and soy (acts like the estrogen hormone).  Do not smoke.  Avoid alcoholic beverages.  Take vitamin supplements as recommended by your health care provider. Taking vitamin E may help in certain cases.  Take calcium and vitamin D supplements to help prevent bone loss.  Group therapy is sometimes helpful.  Acupuncture may help in some cases. SEEK MEDICAL CARE IF:   You have questions about any symptoms you are having.  You need a referral to a specialist (gynecologist, psychiatrist, or psychologist). SEEK IMMEDIATE MEDICAL CARE IF:   You have vaginal bleeding.  Your period lasts longer than 8 days.  Your periods are recurring sooner than 21 days.  You have bleeding after intercourse.  You have severe depression.  You have pain when you urinate.  You have severe headaches.  You have vision problems. Document Released: 04/06/2004 Document Revised: 12/18/2012 Document Reviewed: 09/26/2012 [facility] Patient Information 2015 [place], [place]. This information is not intended to replace advice given to you by your health care provider. Make sure you discuss any questions you have with your health care provider.   Headaches, Frequently Asked  Questions MIGRAINE HEADACHES Q: What is migraine? What causes it? How can I treat it? A: Generally, migraine headaches begin as a dull ache. Then  they develop into a constant, throbbing, and pulsating pain. You may experience pain at the temples. You may experience pain at the front or back of one or both sides of the head. The pain is usually accompanied by a combination of:  Nausea.  Vomiting.  Sensitivity to light and noise. Some people (about 15%) experience an aura (see below) before an attack. The cause of migraine is believed to be chemical reactions in the brain. Treatment for migraine may include over-the-counter or prescription medications. It may also include self-help techniques. These include relaxation training and biofeedback.  Q: What is an aura? A: About 15% of people with migraine get an "aura". This is a sign of neurological symptoms that occur before a migraine headache. You may see wavy or jagged lines, dots, or flashing lights. You might experience tunnel vision or blind spots in one or both eyes. The aura can include visual or auditory hallucinations (something imagined). It may include disruptions in smell (such as strange odors), taste or touch. Other symptoms include:  Numbness.  A "pins and needles" sensation.  Difficulty in recalling or speaking the correct word. These neurological events may last as long as 60 minutes. These symptoms will fade as the headache begins. Q: What is a trigger? A: Certain physical or environmental factors can lead to or "trigger" a migraine. These include:  Foods.  Hormonal changes.  Weather.  Stress. It is important to remember that triggers are different for everyone. To help prevent migraine attacks, you need to figure out which triggers affect you. Keep a headache diary. This is a good way to track triggers. The diary will help you talk to your healthcare professional about your condition. Q: Does weather affect migraines? A: Bright sunshine, hot, humid conditions, and drastic changes in barometric pressure may lead to, or "trigger," a migraine attack in some people. But  studies have shown that weather does not act as a trigger for everyone with migraines. Q: What is the link between migraine and hormones? A: Hormones start and regulate many of your body's functions. Hormones keep your body in balance within a constantly changing environment. The levels of hormones in your body are unbalanced at times. Examples are during menstruation, pregnancy, or menopause. That can lead to a migraine attack. In fact, about three quarters of all women with migraine report that their attacks are related to the menstrual cycle.  Q: Is there an increased risk of stroke for migraine sufferers? A: The likelihood of a migraine attack causing a stroke is very remote. That is not to say that migraine sufferers cannot have a stroke associated with their migraines. In persons under age 24, the most common associated factor for stroke is migraine headache. But over the course of a person's normal life span, the occurrence of migraine headache may actually be associated with a reduced risk of dying from cerebrovascular disease due to stroke.  Q: What are acute medications for migraine? A: Acute medications are used to treat the pain of the headache after it has started. Examples over-the-counter medications, NSAIDs, ergots, and triptans.  Q: What are the triptans? A: Triptans are the newest class of abortive medications. They are specifically targeted to treat migraine. Triptans are vasoconstrictors. They moderate some chemical reactions in the brain. The triptans work on receptors in your brain. Triptans help to restore  the balance of a neurotransmitter called serotonin. Fluctuations in levels of serotonin are thought to be a main cause of migraine.  Q: Are over-the-counter medications for migraine effective? A: Over-the-counter, or "OTC," medications may be effective in relieving mild to moderate pain and associated symptoms of migraine. But you should see your caregiver before beginning any  treatment regimen for migraine.  Q: What are preventive medications for migraine? A: Preventive medications for migraine are sometimes referred to as "prophylactic" treatments. They are used to reduce the frequency, severity, and length of migraine attacks. Examples of preventive medications include antiepileptic medications, antidepressants, beta-blockers, calcium channel blockers, and NSAIDs (nonsteroidal anti-inflammatory drugs). Q: Why are anticonvulsants used to treat migraine? A: During the past few years, there has been an increased interest in antiepileptic drugs for the prevention of migraine. They are sometimes referred to as "anticonvulsants". Both epilepsy and migraine may be caused by similar reactions in the brain.  Q: Why are antidepressants used to treat migraine? A: Antidepressants are typically used to treat people with depression. They may reduce migraine frequency by regulating chemical levels, such as serotonin, in the brain.  Q: What alternative therapies are used to treat migraine? A: The term "alternative therapies" is often used to describe treatments considered outside the scope of conventional Western medicine. Examples of alternative therapy include acupuncture, acupressure, and yoga. Another common alternative treatment is herbal therapy. Some herbs are believed to relieve headache pain. Always discuss alternative therapies with your caregiver before proceeding. Some herbal products contain arsenic and other toxins. TENSION HEADACHES Q: What is a tension-type headache? What causes it? How can I treat it? A: Tension-type headaches occur randomly. They are often the result of temporary stress, anxiety, fatigue, or anger. Symptoms include soreness in your temples, a tightening band-like sensation around your head (a "vice-like" ache). Symptoms can also include a pulling feeling, pressure sensations, and contracting head and neck muscles. The headache begins in your forehead,  temples, or the back of your head and neck. Treatment for tension-type headache may include over-the-counter or prescription medications. Treatment may also include self-help techniques such as relaxation training and biofeedback. CLUSTER HEADACHES Q: What is a cluster headache? What causes it? How can I treat it? A: Cluster headache gets its name because the attacks come in groups. The pain arrives with little, if any, warning. It is usually on one side of the head. A tearing or bloodshot eye and a runny nose on the same side of the headache may also accompany the pain. Cluster headaches are believed to be caused by chemical reactions in the brain. They have been described as the most severe and intense of any headache type. Treatment for cluster headache includes prescription medication and oxygen. SINUS HEADACHES Q: What is a sinus headache? What causes it? How can I treat it? A: When a cavity in the bones of the face and skull (a sinus) becomes inflamed, the inflammation will cause localized pain. This condition is usually the result of an allergic reaction, a tumor, or an infection. If your headache is caused by a sinus blockage, such as an infection, you will probably have a fever. An x-ray will confirm a sinus blockage. Your caregiver's treatment might include antibiotics for the infection, as well as antihistamines or decongestants.  REBOUND HEADACHES Q: What is a rebound headache? What causes it? How can I treat it? A: A pattern of taking acute headache medications too often can lead to a condition known as "rebound headache." A pattern  of taking too much headache medication includes taking it more than 2 days per week or in excessive amounts. That means more than the label or a caregiver advises. With rebound headaches, your medications not only stop relieving pain, they actually begin to cause headaches. Doctors treat rebound headache by tapering the medication that is being overused. Sometimes  your caregiver will gradually substitute a different type of treatment or medication. Stopping may be a challenge. Regularly overusing a medication increases the potential for serious side effects. Consult a caregiver if you regularly use headache medications more than 2 days per week or more than the label advises. ADDITIONAL QUESTIONS AND ANSWERS Q: What is biofeedback? A: Biofeedback is a self-help treatment. Biofeedback uses special equipment to monitor your body's involuntary physical responses. Biofeedback monitors:  Breathing.  Pulse.  Heart rate.  Temperature.  Muscle tension.  Brain activity. Biofeedback helps you refine and perfect your relaxation exercises. You learn to control the physical responses that are related to stress. Once the technique has been mastered, you do not need the equipment any more. Q: Are headaches hereditary? A: Four out of five (80%) of people that suffer report a family history of migraine. Scientists are not sure if this is genetic or a family predisposition. Despite the uncertainty, a child has a 50% chance of having migraine if one parent suffers. The child has a 75% chance if both parents suffer.  Q: Can children get headaches? A: By the time they reach high school, most young people have experienced some type of headache. Many safe and effective approaches or medications can prevent a headache from occurring or stop it after it has begun.  Q: What type of doctor should I see to diagnose and treat my headache? A: Start with your primary caregiver. Discuss his or her experience and approach to headaches. Discuss methods of classification, diagnosis, and treatment. Your caregiver may decide to recommend you to a headache specialist, depending upon your symptoms or other physical conditions. Having diabetes, allergies, etc., may require a more comprehensive and inclusive approach to your headache. The National Head[employer] Foundation will provide, upon request,  a list of [facility] physician members in your state. Document Released: 05/20/2003 Document Revised: 05/22/2011 Document Reviewed: 10/28/2007 [facility] Patient Information 2015 [place], [place]. This information is not intended to replace advice given to you by your health care provider. Make sure you discuss any questions you have with your health care provider.

## 2013-12-31 NOTE — Progress Notes (Signed)
Rebekah Silva 1971/02/02 [id number]   History:    43 y.o.  for annual gyn exam who is been complaining on and off of hot flushes. Because of an equal pains and she was having yesterday she took it tramadol and began expand some nausea and some headaches today she took Imitrex this morning and this evening. She was a little nauseous today. Her blood pressure was 128/88. Her nausea she was given Zofran ODT 8 mg sublingual today. Patient had a Mirena IUD placed in 2012 and she has no menstrual cycles. Because of her history of ulcerative colitis she had been on chronic steroids and has been off for now one year. Her bone density study in 2014 was normal. Patient is currently taking her calcium and vitamin D. She had her dTap Vaccine in 2013. Patient had her flu vaccine a few weeks ago.  Past medical history,surgical history, family history and social history were all reviewed and documented in the EPIC chart.  Gynecologic History No LMP recorded. Patient is not currently having periods (Reason: IUD). Contraception: IUD Last Pap: 2012. Results were: normal Last mammogram: 2012. Results were: normal  Obstetric History OB History  Gravida Para Term Preterm AB SAB TAB Ectopic Multiple Living  1 1 1       1     # Outcome Date GA Lbr Len/2nd Weight Sex Delivery Anes PTL Lv  1 TRM     M CS   Y       ROS: A ROS was performed and pertinent positives and negatives are included in the history.  GENERAL: No fevers or chills. HEENT: No change in vision, no earache, sore throat or sinus congestion. NECK: No pain or stiffness. CARDIOVASCULAR: No chest pain or pressure. No palpitations. PULMONARY: No shortness of breath, cough or wheeze. GASTROINTESTINAL: No abdominal pain, nausea, vomiting or diarrhea, melena or bright red blood per rectum. GENITOURINARY: No urinary frequency, urgency, hesitancy or dysuria. MUSCULOSKELETAL: No joint or muscle pain, no back pain, no recent trauma. DERMATOLOGIC: No rash,  no itching, no lesions. ENDOCRINE: No polyuria, polydipsia, no heat or cold intolerance. No recent change in weight. HEMATOLOGICAL: No anemia or easy bruising or bleeding. NEUROLOGIC: No headache, seizures, numbness, tingling or weakness. PSYCHIATRIC: No depression, no loss of interest in normal activity or change in sleep pattern.     Exam: chaperone present  BP 128/88  Ht 5' 6"  (1.676 m)  Wt 290 lb (131.543 kg)  BMI 46.83 kg/m2  Body mass index is 46.83 kg/(m^2).  General appearance : Well developed well nourished female. No acute distress HEENT: Neck supple, trachea midline, no carotid bruits, no thyroidmegaly Lungs: Clear to auscultation, no rhonchi or wheezes, or rib retractions  Heart: Regular rate and rhythm, no murmurs or gallops Breast:Examined in sitting and supine position were symmetrical in appearance, no palpable masses or tenderness,  no skin retraction, no nipple inversion, no nipple discharge, no skin discoloration, no axillary or supraclavicular lymphadenopathy Abdomen: no palpable masses or tenderness, no rebound or guarding Extremities: no edema or skin discoloration or tenderness  Pelvic:  Bartholin, Urethra, Skene Glands: Within normal limits             Vagina: No gross lesions or discharge  Cervix: No gross lesions or discharge  Uterus  anteverted, normal size, shape and consistency, non-tender and mobile  Adnexa  Without masses or tenderness  Anus and perineum  normal   Rectovaginal  normal sphincter tone without palpated masses or tenderness  Hemoccult not done     Assessment/Plan:  43 y.o. female for annual exam with questionable perimenopausal symptoms we will check an [facility] today. Her PCP Dr. Dema Severin has been doing her blood work. She also seen a rheumatologist who worked her up for fibromyalgia. She is followed by the gastroenterologist for her history of colitis and has been in remission for the past year. A Pap smear was done today. She is to  schedule her mammogram which is overdue. We did not see the IUD string today so she will come back next week for an ultrasound to confirm that is in the right position and has not migrated.   Terrance Mass MD, 5:45 PM 12/31/2013

## 2014-01-01 LAB — FOLLICLE STIMULATING HORMONE: FSH: 7.9 m[IU]/mL

## 2014-01-02 LAB — CYTOLOGY - PAP

## 2014-01-12 ENCOUNTER — Other Ambulatory Visit: Payer: Self-pay | Admitting: Gynecology

## 2014-01-12 ENCOUNTER — Other Ambulatory Visit: Payer: Self-pay

## 2014-01-12 ENCOUNTER — Encounter: Payer: Self-pay | Admitting: Gynecology

## 2014-01-12 ENCOUNTER — Ambulatory Visit (INDEPENDENT_AMBULATORY_CARE_PROVIDER_SITE_OTHER): Payer: BC Managed Care – PPO | Admitting: Gynecology

## 2014-01-12 ENCOUNTER — Ambulatory Visit (INDEPENDENT_AMBULATORY_CARE_PROVIDER_SITE_OTHER): Payer: BC Managed Care – PPO

## 2014-01-12 DIAGNOSIS — Z1231 Encounter for screening mammogram for malignant neoplasm of breast: Secondary | ICD-10-CM

## 2014-01-12 DIAGNOSIS — T8332XA Displacement of intrauterine contraceptive device, initial encounter: Secondary | ICD-10-CM

## 2014-01-12 DIAGNOSIS — T8389XA Other specified complication of genitourinary prosthetic devices, implants and grafts, initial encounter: Principal | ICD-10-CM

## 2014-01-12 DIAGNOSIS — Z30431 Encounter for routine checking of intrauterine contraceptive device: Secondary | ICD-10-CM

## 2014-01-12 DIAGNOSIS — N8300 Follicular cyst of ovary, unspecified side: Secondary | ICD-10-CM

## 2014-01-12 NOTE — Progress Notes (Signed)
    Patient presented to the office today to discuss her ultrasound. She was seen in the office on 12/31/2013 for her annual exam.patient had a Mirena IUD in 2012 and was doing well. At time of her examination the IUD string was not seen and this was the reason for ultrasound. Her recent Pap smear was normal. An FSH had been done because she occasionally will get a hot flashes was reportedly normal. Also her PCP had done a TSH recently which patient states was normal. Patient occasionally gets headaches and has been seen by the neurologist approximately 2 years ago.  Ultrasound: Uterus measured 8.0 x 4.2 x 4.2 cm meters with endometrial stripe 3.4 mm. Right and left ovary were normal. IUD was seen in the proper position. A small left ovarian dominant follicle measuring 20 x 14 mm was noted. No fluid in the cul-de-sac.  Assessment/plan: The patient was reassured the IUD is in the proper position. Patient was scheduled to return back to the office in one year or when necessary. It was recommended that she follow up with a neurologist if she continues to have frequent headaches.

## 2014-02-04 ENCOUNTER — Ambulatory Visit
Admission: RE | Admit: 2014-02-04 | Discharge: 2014-02-04 | Disposition: A | Discharge status: Home / Self Care | Payer: BC Managed Care – PPO | Source: Ambulatory Visit

## 2014-02-04 DIAGNOSIS — Z1231 Encounter for screening mammogram for malignant neoplasm of breast: Secondary | ICD-10-CM

## 2014-04-16 ENCOUNTER — Emergency Department (HOSPITAL_COMMUNITY)
Admission: EM | Admit: 2014-04-16 | Discharge: 2014-04-16 | Disposition: A | Discharge status: Home / Self Care | Payer: Worker's Compensation | Attending: Emergency Medicine | Admitting: Emergency Medicine

## 2014-04-16 ENCOUNTER — Encounter (HOSPITAL_COMMUNITY): Payer: Self-pay | Admitting: *Deleted

## 2014-04-16 DIAGNOSIS — R42 Dizziness and giddiness: Secondary | ICD-10-CM

## 2014-04-16 DIAGNOSIS — Z880 Allergy status to penicillin: Secondary | ICD-10-CM | POA: Insufficient documentation

## 2014-04-16 DIAGNOSIS — R55 Syncope and collapse: Secondary | ICD-10-CM | POA: Diagnosis present

## 2014-04-16 DIAGNOSIS — Z3202 Encounter for pregnancy test, result negative: Secondary | ICD-10-CM | POA: Insufficient documentation

## 2014-04-16 DIAGNOSIS — Z8719 Personal history of other diseases of the digestive system: Secondary | ICD-10-CM | POA: Diagnosis not present

## 2014-04-16 DIAGNOSIS — Z7951 Long term (current) use of inhaled steroids: Secondary | ICD-10-CM | POA: Diagnosis not present

## 2014-04-16 DIAGNOSIS — R110 Nausea: Secondary | ICD-10-CM | POA: Insufficient documentation

## 2014-04-16 DIAGNOSIS — G43909 Migraine, unspecified, not intractable, without status migrainosus: Secondary | ICD-10-CM | POA: Diagnosis not present

## 2014-04-16 DIAGNOSIS — Z79899 Other long term (current) drug therapy: Secondary | ICD-10-CM | POA: Insufficient documentation

## 2014-04-16 DIAGNOSIS — E780 Pure hypercholesterolemia: Secondary | ICD-10-CM | POA: Insufficient documentation

## 2014-04-16 LAB — BASIC METABOLIC PANEL
ANION GAP: 7 (ref 5–15)
BUN: 11 mg/dL (ref 6–23)
CALCIUM: 9.1 mg/dL (ref 8.4–10.5)
CO2: 25 mmol/L (ref 19–32)
CREATININE: 0.88 mg/dL (ref 0.50–1.10)
Chloride: 107 mmol/L (ref 96–112)
GFR calc Af Amer: >90 mL/min (ref >90)
GFR calc non Af Amer: 81 mL/min — ABNORMAL LOW (ref >90)
GLUCOSE: 91 mg/dL (ref 70–99)
Potassium: 3.7 mmol/L (ref 3.5–5.1)
Sodium: 139 mmol/L (ref 135–145)

## 2014-04-16 LAB — CBG MONITORING, ED: Glucose-Capillary: 87 mg/dL (ref 70–99)

## 2014-04-16 LAB — CBC
HCT: 41.2 % (ref 36.0–46.0)
HEMOGLOBIN: 14.2 g/dL (ref 12.0–15.0)
MCH: 32.2 pg (ref 26.0–34.0)
MCHC: 34.5 g/dL (ref 30.0–36.0)
MCV: 93.4 fL (ref 78.0–100.0)
PLATELETS: 328 10*3/uL (ref 150–400)
RBC: 4.41 MIL/uL (ref 3.87–5.11)
RDW: 14.1 % (ref 11.5–15.5)
WBC: 12.2 10*3/uL — ABNORMAL HIGH (ref 4.0–10.5)

## 2014-04-16 LAB — POC URINE PREG, ED: Preg Test, Ur: NEGATIVE

## 2014-04-16 LAB — URINALYSIS, ROUTINE W REFLEX MICROSCOPIC
Bilirubin Urine: NEGATIVE
Glucose, UA: NEGATIVE mg/dL
Hgb urine dipstick: NEGATIVE
Ketones, ur: 15 mg/dL — AB
Leukocytes, UA: NEGATIVE
Nitrite: NEGATIVE
Protein, ur: NEGATIVE mg/dL
Specific Gravity, Urine: 1.017 (ref 1.005–1.030)
Urobilinogen, UA: 0.2 mg/dL (ref 0.0–1.0)
pH: 7 (ref 5.0–8.0)

## 2014-04-16 LAB — I-STAT TROPONIN, ED: TROPONIN I, POC: 0 ng/mL (ref 0.00–0.08)

## 2014-04-16 MED ORDER — ONDANSETRON HCL 4 MG/2ML IJ SOLN
4.0000 mg | Freq: Once | INTRAMUSCULAR | Status: AC
  Administered 2014-04-16: 4 mg via INTRAVENOUS
  Filled 2014-04-16: qty 2

## 2014-04-16 MED ORDER — SODIUM CHLORIDE 0.9 % IV SOLN
Freq: Once | INTRAVENOUS | Status: AC
  Administered 2014-04-16: 14:00:00 via INTRAVENOUS

## 2014-04-16 NOTE — ED Notes (Signed)
Ambulated pt with minimal assistance. SpO2 98% throughout ambulation.

## 2014-04-16 NOTE — ED Provider Notes (Signed)
CSN: [id number]     Arrival date & time 04/16/14  1034 History   First MD Initiated Contact with Patient 04/16/14 1035     Chief Complaint  Patient presents with  . Near Syncope     (Consider location/radiation/quality/duration/timing/severity/associated sxs/prior Treatment) HPI Patient presents to the emergency department with feeling like she became dizzy with nausea this morning and she like she was having some hot flashes and heartburn the same time.  The patient states she did not have any other symptoms and these get worse when she stood up.  Patient, states she has not having any chest pain, shortness of breath, headache, blurred vision, back pain, neck pain, diarrhea, vomiting, fever, rash, or syncope.  The patient states that she is feeling better at this time, still with some mild nausea.  Past Medical History  Diagnosis Date  . PIH (pregnancy induced hypertension)   . Colitis   . Hypercholesterolemia     DR. WHITE  . Migraines    Past Surgical History  Procedure Laterality Date  . Cesarean section    . Mouth surgery    . Intrauterine device insertion  12/15/2005   Family History  Problem Relation Age of Onset  . Hypertension Father   . Diabetes Paternal Grandmother   . Heart disease Paternal Grandmother   . Cancer Maternal Grandfather     colon?   History  Substance Use Topics  . Smoking status: Never Smoker   . Smokeless tobacco: Never Used  . Alcohol Use: Yes     Comment: occ   OB History    Gravida Para Term Preterm AB TAB SAB Ectopic Multiple Living   1 1 1       1      Review of Systems  All other systems negative except as documented in the HPI. All pertinent positives and negatives as reviewed in the HPI.   Allergies  Aspirin; Codeine; Other; and Penicillins  Home Medications   Prior to Admission medications   Medication Sig Start Date End Date Taking? Authorizing Provider  Budesonide (UCERIS) 9 MG TB24 Take by mouth.    Historical Provider,  MD  Calcium Carbonate-Vitamin D (CALTRATE 600+D) 600-400 MG-UNIT per tablet Take 1 tablet by mouth 2 (two) times daily.      Historical Provider, MD  clidinium-chlordiazePOXIDE (LIBRAX) 2.5-5 MG per capsule Take 1 capsule by mouth 3 (three) times daily as needed.      Historical Provider, MD  hyoscyamine (HYOMAX) 0.125 MG tablet Take 0.125 mg by mouth every 4 (four) hours as needed.      Historical Provider, MD  mesalamine (LIALDA) 1.2 G EC tablet Take 1,200 mg by mouth daily.     Historical Provider, MD  mometasone (NASONEX) 50 MCG/ACT nasal spray Place 2 sprays into the nose daily.      Historical Provider, MD  Multiple Vitamin (MULTIVITAMIN) tablet Take 1 tablet by mouth daily.      Historical Provider, MD  omeprazole (PRILOSEC) 20 MG capsule Take 20 mg by mouth daily.      Historical Provider, MD  pravastatin (PRAVACHOL) 40 MG tablet Take 40 mg by mouth daily.      Historical Provider, MD  rizatriptan (MAXALT) 10 MG tablet Take 10 mg by mouth as needed for migraine. May repeat in 2 hours if needed    Historical Provider, MD  traMADol (ULTRAM) 50 MG tablet Take by mouth every 6 (six) hours as needed.    Historical Provider, MD   BP  129/82 mmHg  Pulse 103  Resp 24  SpO2 100% Physical Exam  Constitutional: She is oriented to person, place, and time. She appears well-developed and well-nourished.  HENT:  Head: Normocephalic and atraumatic.  Mouth/Throat: Oropharynx is clear and moist.  Eyes: EOM are normal. Pupils are equal, round, and reactive to light.  Neck: Normal range of motion. Neck supple.  Cardiovascular: Normal rate, regular rhythm and normal heart sounds.   Pulmonary/Chest: Effort normal and breath sounds normal. No respiratory distress.  Abdominal: Soft. Bowel sounds are normal. She exhibits no distension. There is no tenderness.  Neurological: She is alert and oriented to person, place, and time. She exhibits normal muscle tone. Coordination normal.  Skin: Skin is warm and  dry. No rash noted. No erythema.  Psychiatric: She has a normal mood and affect. Her behavior is normal.  Nursing note and vitals reviewed.   ED Course  Procedures (including critical care time) Labs Review Labs Reviewed  CBC - Abnormal; Notable for the following:    WBC 12.2 (*)    All other components within normal limits  BASIC METABOLIC PANEL - Abnormal; Notable for the following:    GFR calc non Af Amer 81 (*)    All other components within normal limits  URINALYSIS, ROUTINE W REFLEX MICROSCOPIC  CBG MONITORING, ED  POC URINE PREG, ED  I-STAT TROPOININ, ED    Imaging Review No results found.   EKG Interpretation   Date/Time:  Thursday April 16 2014 10:46:17 EST Ventricular Rate:  88 PR Interval:  146 QRS Duration: 81 QT Interval:  385 QTC Calculation: 466 R Axis:   22 Text Interpretation:  Sinus rhythm Borderline repolarization abnormality  ST elevation, consider inferior injury Similar to prior Confirmed by  Mingo Amber  MD, [place] ([id number]) on 04/16/2014 10:52:28 AM     Patient is feeling progressively resolution of her symptoms at this time.  She has been monitored here in the emergency department over several hours.  She received IV fluids and antiemetics.  The patient has ambulated to the bathroom several times without difficulty.  The patient had a near syncopal episode of unknown origin.  She did not have any chest pain or significant neurological impairment at time of the incident.  Patient be discharged home and advised to follow-up with her primary care doctor, told to return here as needed MDM   Final diagnoses:  None       Brent General, PA-C 04/16/14 1506  Evelina Bucy, MD 04/18/14 [id number]

## 2014-04-16 NOTE — ED Notes (Signed)
Pt states she had a migraine most of yesterday and this AM while at school she began feeling extremely dizzy and nauseous. Pt also reports she began to feel warm and feels like she was having heartburn. Pt states all sx are worse upon standing. Pt denies chest pain, any radiation, and vomiting.

## 2014-04-16 NOTE — ED Notes (Signed)
Pt is in stable condition upon d/c and ambulates independently from ED. Pt verbalizes understanding rt d/c instructions.

## 2014-04-16 NOTE — Discharge Instructions (Signed)
Increase your fluid intake and rest as much as possible.  Follow-up with your primary care Dr. for recheck

## 2014-08-27 ENCOUNTER — Other Ambulatory Visit: Payer: Self-pay | Admitting: Gastroenterology

## 2015-01-22 ENCOUNTER — Encounter: Payer: Self-pay | Admitting: Gynecology

## 2015-01-22 ENCOUNTER — Ambulatory Visit (INDEPENDENT_AMBULATORY_CARE_PROVIDER_SITE_OTHER): Payer: BC Managed Care – PPO | Admitting: Gynecology

## 2015-01-22 VITALS — BP 128/86 | Ht 66.5 in | Wt 269.0 lb

## 2015-01-22 DIAGNOSIS — Z01419 Encounter for gynecological examination (general) (routine) without abnormal findings: Secondary | ICD-10-CM | POA: Diagnosis not present

## 2015-01-22 DIAGNOSIS — Z30431 Encounter for routine checking of intrauterine contraceptive device: Secondary | ICD-10-CM

## 2015-01-22 NOTE — Progress Notes (Signed)
KIONNA BRIER [birth year] [id number]   History:    44 y.o.  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 Her PCP also is following her for hypercholesterolemia and has been doing her blood work. Patient had a Mirena IUD placed in 2012 and has minimal if any menstrual cycle. As a result of patient's long-term steroid use in the past she had a bone density study in 2014 which was normal. Patient's vaccines are up-to-date.  Past medical history,surgical history, family history and social history were all reviewed and documented in the EPIC chart.  Gynecologic History No LMP recorded. Patient is not currently having periods (Reason: IUD). Contraception: IUD Last Pap: 2015. Results were: normal Last mammogram: 2015. Results were: normal  Obstetric History OB History  Gravida Para Term Preterm AB SAB TAB Ectopic Multiple Living  1 1 1       1     # Outcome Date GA Lbr Len/2nd Weight Sex Delivery Anes PTL Lv  1 Term     M CS-Unspec   Y       ROS: A ROS was performed and pertinent positives and negatives are included in the history.  GENERAL: No fevers or chills. HEENT: No change in vision, no earache, sore throat or sinus congestion. NECK: No pain or stiffness. CARDIOVASCULAR: No chest pain or pressure. No palpitations. PULMONARY: No shortness of breath, cough or wheeze. GASTROINTESTINAL: No abdominal pain, nausea, vomiting or diarrhea, melena or bright red blood per rectum. GENITOURINARY: No urinary frequency, urgency, hesitancy or dysuria. MUSCULOSKELETAL: No joint or muscle pain, no back pain, no recent trauma. DERMATOLOGIC: No rash, no itching, no lesions. ENDOCRINE: No polyuria, polydipsia, no heat or cold intolerance. No recent change in weight. HEMATOLOGICAL: No anemia or easy bruising or bleeding. NEUROLOGIC: No headache, seizures, numbness, tingling or weakness.  PSYCHIATRIC: No depression, no loss of interest in normal activity or change in sleep pattern.     Exam: chaperone present  BP 128/86 mmHg  Ht 5' 6.5" (1.689 m)  Wt 269 lb (122.018 kg)  BMI 42.77 kg/m2  Body mass index is 42.77 kg/(m^2).  General appearance : Well developed well nourished female. No acute distress HEENT: Eyes: no retinal hemorrhage or exudates,  Neck supple, trachea midline, no carotid bruits, no thyroidmegaly Lungs: Clear to auscultation, no rhonchi or wheezes, or rib retractions  Heart: Regular rate and rhythm, no murmurs or gallops Breast:Examined in sitting and supine position were symmetrical in appearance, no palpable masses or tenderness,  no skin retraction, no nipple inversion, no nipple discharge, no skin discoloration, no axillary or supraclavicular lymphadenopathy Abdomen: no palpable masses or tenderness, no rebound or guarding Extremities: no edema or skin discoloration or tenderness  Pelvic:  Bartholin, Urethra, Skene Glands: Within normal limits             Vagina: No gross lesions or discharge  Cervix: No gross lesions or discharge, IUD string not seen  Uterus  anteverted, normal size, shape and consistency, non-tender and mobile  Adnexa  Without masses or tenderness  Anus and perineum  normal   Rectovaginal  not examined due to anal fissure being evaluated by general surgeon            Hemoccult not indicated     Assessment/Plan:  44 y.o. female for annual exam will return back to the office in 1-2 weeks for  an ultrasound to confirm the IUD is in the proper position since the IUD string was not seen. Patient's PCP has been doing her blood work. Next year she will need a bone density study here in our office. She was reminded to schedule her mammogram for next month. PCP we'll be doing her blood work. Patient's vaccines are up-to-date.   Terrance Mass MD, 9:24 AM 01/22/2015

## 2015-02-08 ENCOUNTER — Other Ambulatory Visit: Payer: Self-pay

## 2015-02-08 DIAGNOSIS — Z1231 Encounter for screening mammogram for malignant neoplasm of breast: Secondary | ICD-10-CM

## 2015-02-11 ENCOUNTER — Telehealth: Payer: Self-pay

## 2015-02-11 ENCOUNTER — Ambulatory Visit
Admission: RE | Admit: 2015-02-11 | Discharge: 2015-02-11 | Disposition: A | Discharge status: Home / Self Care | Payer: BC Managed Care – PPO | Source: Ambulatory Visit

## 2015-02-11 ENCOUNTER — Other Ambulatory Visit: Payer: Self-pay | Admitting: Gynecology

## 2015-02-11 ENCOUNTER — Ambulatory Visit (INDEPENDENT_AMBULATORY_CARE_PROVIDER_SITE_OTHER): Payer: BC Managed Care – PPO | Admitting: Gynecology

## 2015-02-11 ENCOUNTER — Encounter: Payer: Self-pay | Admitting: Gynecology

## 2015-02-11 ENCOUNTER — Ambulatory Visit (INDEPENDENT_AMBULATORY_CARE_PROVIDER_SITE_OTHER): Payer: BC Managed Care – PPO

## 2015-02-11 VITALS — BP 120/82

## 2015-02-11 DIAGNOSIS — Z30431 Encounter for routine checking of intrauterine contraceptive device: Secondary | ICD-10-CM

## 2015-02-11 DIAGNOSIS — N83201 Unspecified ovarian cyst, right side: Secondary | ICD-10-CM | POA: Insufficient documentation

## 2015-02-11 DIAGNOSIS — S3669XA Other injury of rectum, initial encounter: Secondary | ICD-10-CM

## 2015-02-11 DIAGNOSIS — K631 Perforation of intestine (nontraumatic): Secondary | ICD-10-CM | POA: Diagnosis not present

## 2015-02-11 DIAGNOSIS — Z1231 Encounter for screening mammogram for malignant neoplasm of breast: Secondary | ICD-10-CM

## 2015-02-11 NOTE — Progress Notes (Signed)
   Patient is a 44 year old who was seen in the office for her annual exam on 01/22/2015. During the exam her Mirena IUD that had been placed in 2012 was not seen so she is here for an ultrasound to confirm the IUD is in the proper place. She is suffering from anal fissure for which she has been followed by the general surgeon and has a follow-up appointment next week.  Ultrasound today: Uterus 9.0 x 5.4 x 4.6 cm with endometrial stripe of 5 mm. The IUD was seen in the normal position. Left ovary was normal. The right ovary had a thin wall echo-free cyst measuring 27 x 17 x 27 mm and a collapsed follicle measuring 12 x 8 mm.  Assessment/plan: Small right ovarian cyst noted IUD in the proper position. We'll have patient return back in 6 months for follow-up on the system a sure that his been completely resolution it appears benign. Literature information was provided.

## 2015-02-11 NOTE — Patient Instructions (Signed)
Ovarian Cyst An ovarian cyst is a fluid-filled sac that forms on an ovary. The ovaries are small organs that produce eggs in women. Various types of cysts can form on the ovaries. Most are not cancerous. Many do not cause problems, and they often go away on their own. Some may cause symptoms and require treatment. Common types of ovarian cysts include:  Functional cysts--These cysts may occur every month during the menstrual cycle. This is normal. The cysts usually go away with the next menstrual cycle if the woman does not get pregnant. Usually, there are no symptoms with a functional cyst.  Endometrioma cysts--These cysts form from the tissue that lines the uterus. They are also called "chocolate cysts" because they become filled with blood that turns brown. This type of cyst can cause pain in the lower abdomen during intercourse and with your menstrual period.  Cystadenoma cysts--This type develops from the cells on the outside of the ovary. These cysts can get very big and cause lower abdomen pain and pain with intercourse. This type of cyst can twist on itself, cut off its blood supply, and cause severe pain. It can also easily rupture and cause a lot of pain.  Dermoid cysts--This type of cyst is sometimes found in both ovaries. These cysts may contain different kinds of body tissue, such as skin, teeth, hair, or cartilage. They usually do not cause symptoms unless they get very big.  Theca lutein cysts--These cysts occur when too much of a certain hormone (human chorionic gonadotropin) is produced and overstimulates the ovaries to produce an egg. This is most common after procedures used to assist with the conception of a baby (in vitro fertilization). CAUSES   Fertility drugs can cause a condition in which multiple large cysts are formed on the ovaries. This is called ovarian hyperstimulation syndrome.  A condition called polycystic ovary syndrome can cause hormonal imbalances that can lead to  nonfunctional ovarian cysts. SIGNS AND SYMPTOMS  Many ovarian cysts do not cause symptoms. If symptoms are present, they may include:  Pelvic pain or pressure.  Pain in the lower abdomen.  Pain during sexual intercourse.  Increasing girth (swelling) of the abdomen.  Abnormal menstrual periods.  Increasing pain with menstrual periods.  Stopping having menstrual periods without being pregnant. DIAGNOSIS  These cysts are commonly found during a routine or annual pelvic exam. Tests may be ordered to find out more about the cyst. These tests may include:  Ultrasound.  X-ray of the pelvis.  CT scan.  MRI.  Blood tests. TREATMENT  Many ovarian cysts go away on their own without treatment. Your health care provider may want to check your cyst regularly for 2-3 months to see if it changes. For women in menopause, it is particularly important to monitor a cyst closely because of the higher rate of ovarian cancer in menopausal women. When treatment is needed, it may include any of the following:  A procedure to drain the cyst (aspiration). This may be done using a long needle and ultrasound. It can also be done through a laparoscopic procedure. This involves using a thin, lighted tube with a tiny camera on the end (laparoscope) inserted through a small incision.  Surgery to remove the whole cyst. This may be done using laparoscopic surgery or an open surgery involving a larger incision in the lower abdomen.  Hormone treatment or birth control pills. These methods are sometimes used to help dissolve a cyst. HOME CARE INSTRUCTIONS   Only take over-the-counter  or prescription medicines as directed by your health care provider.  Follow up with your health care provider as directed.  Get regular pelvic exams and Pap tests. SEEK MEDICAL CARE IF:   Your periods are late, irregular, or painful, or they stop.  Your pelvic pain or abdominal pain does not go away.  Your abdomen becomes  larger or swollen.  You have pressure on your bladder or trouble emptying your bladder completely.  You have pain during sexual intercourse.  You have feelings of fullness, pressure, or discomfort in your stomach.  You lose weight for no apparent reason.  You feel generally ill.  You become constipated.  You lose your appetite.  You develop acne.  You have an increase in body and facial hair.  You are gaining weight, without changing your exercise and eating habits.  You think you are pregnant. SEEK IMMEDIATE MEDICAL CARE IF:   You have increasing abdominal pain.  You feel sick to your stomach (nauseous), and you throw up (vomit).  You develop a fever that comes on suddenly.  You have abdominal pain during a bowel movement.  Your menstrual periods become heavier than usual. MAKE SURE YOU:  Understand these instructions.  Will watch your condition.  Will get help right away if you are not doing well or get worse.   This information is not intended to replace advice given to you by your health care provider. Make sure you discuss any questions you have with your health care provider.   Document Released: 02/27/2005 Document Revised: 03/04/2013 Document Reviewed: 11/04/2012 Elsevier Interactive Patient Education [employer].

## 2015-02-11 NOTE — Telephone Encounter (Signed)
I called [facility] at Dr. Durenda Guthrie request and spoke with Abigail Butts in nursing. I explained patient in pain with rectal fissure. Unable to work yesterday or today. Has appt Tues with Dr. Rosendo Gros but Dr. Moshe Salisbury wanted to see if they could see her this evening or tomorrow. She reviewed the schedules. The doctors that deal with rectal issues are either, off, in surgery or at the hospital.  She had no way to get her in.  I asked her if anything changed and she could find a way to please call the patient as she is tearful and in much pain.  Patient was informed.

## 2015-02-16 ENCOUNTER — Other Ambulatory Visit (HOSPITAL_COMMUNITY): Payer: Self-pay | Admitting: *Deleted

## 2015-02-16 ENCOUNTER — Encounter (HOSPITAL_COMMUNITY): Payer: Self-pay | Admitting: *Deleted

## 2015-02-16 NOTE — Progress Notes (Signed)
Spoke with dr Herbie Baltimore fitzgerald about ekg 04-16-14, and pt history, ekg does not needs to be repeated day of surgery 02-17-15 per dr Herbie Baltimore fitzgerald

## 2015-02-17 ENCOUNTER — Encounter (HOSPITAL_COMMUNITY)
Admission: RE | Disposition: A | Discharge status: Home / Self Care | Payer: Self-pay | Source: Ambulatory Visit | Attending: General Surgery

## 2015-02-17 ENCOUNTER — Encounter (HOSPITAL_COMMUNITY): Payer: Self-pay | Admitting: *Deleted

## 2015-02-17 ENCOUNTER — Ambulatory Visit (HOSPITAL_COMMUNITY): Payer: BC Managed Care – PPO | Admitting: Anesthesiology

## 2015-02-17 ENCOUNTER — Ambulatory Visit (HOSPITAL_COMMUNITY)
Admission: RE | Admit: 2015-02-17 | Discharge: 2015-02-17 | Disposition: A | Discharge status: Home / Self Care | Payer: BC Managed Care – PPO | Source: Ambulatory Visit | Attending: General Surgery | Admitting: General Surgery

## 2015-02-17 DIAGNOSIS — K219 Gastro-esophageal reflux disease without esophagitis: Secondary | ICD-10-CM | POA: Diagnosis not present

## 2015-02-17 DIAGNOSIS — K602 Anal fissure, unspecified: Secondary | ICD-10-CM | POA: Diagnosis not present

## 2015-02-17 DIAGNOSIS — Z6841 Body Mass Index (BMI) 40.0 and over, adult: Secondary | ICD-10-CM | POA: Insufficient documentation

## 2015-02-17 DIAGNOSIS — Z79899 Other long term (current) drug therapy: Secondary | ICD-10-CM | POA: Diagnosis not present

## 2015-02-17 HISTORY — PX: SPHINCTEROTOMY: SHX5279

## 2015-02-17 HISTORY — PX: RECTAL EXAM UNDER ANESTHESIA: SHX6399

## 2015-02-17 HISTORY — DX: Gastro-esophageal reflux disease without esophagitis: K21.9

## 2015-02-17 HISTORY — DX: Unspecified osteoarthritis, unspecified site: M19.90

## 2015-02-17 LAB — BASIC METABOLIC PANEL
ANION GAP: 11 (ref 5–15)
BUN: 14 mg/dL (ref 6–20)
CALCIUM: 9.2 mg/dL (ref 8.9–10.3)
CO2: 23 mmol/L (ref 22–32)
CREATININE: 0.82 mg/dL (ref 0.44–1.00)
Chloride: 106 mmol/L (ref 101–111)
GFR calc Af Amer: >60 mL/min (ref >60)
GLUCOSE: 92 mg/dL (ref 65–99)
Potassium: 3.7 mmol/L (ref 3.5–5.1)
Sodium: 140 mmol/L (ref 135–145)

## 2015-02-17 LAB — CBC
HCT: 44.7 % (ref 36.0–46.0)
Hemoglobin: 15.3 g/dL — ABNORMAL HIGH (ref 12.0–15.0)
MCH: 31.7 pg (ref 26.0–34.0)
MCHC: 34.2 g/dL (ref 30.0–36.0)
MCV: 92.7 fL (ref 78.0–100.0)
PLATELETS: 339 10*3/uL (ref 150–400)
RBC: 4.82 MIL/uL (ref 3.87–5.11)
RDW: 13.3 % (ref 11.5–15.5)
WBC: 9.8 10*3/uL (ref 4.0–10.5)

## 2015-02-17 LAB — HCG, SERUM, QUALITATIVE: Preg, Serum: NEGATIVE

## 2015-02-17 SURGERY — SPHINCTEROTOMY, ANAL
Anesthesia: General | Site: Rectum

## 2015-02-17 MED ORDER — BUPIVACAINE LIPOSOME 1.3 % IJ SUSP
20.0000 mL | Freq: Once | INTRAMUSCULAR | Status: AC
  Administered 2015-02-17: 20 mL
  Filled 2015-02-17: qty 20

## 2015-02-17 MED ORDER — PROMETHAZINE HCL 25 MG/ML IJ SOLN
6.2500 mg | INTRAMUSCULAR | Status: DC | PRN
  Administered 2015-02-17: 12.5 mg via INTRAVENOUS

## 2015-02-17 MED ORDER — ONDANSETRON HCL 4 MG/2ML IJ SOLN
INTRAMUSCULAR | Status: AC
  Filled 2015-02-17: qty 2

## 2015-02-17 MED ORDER — DIBUCAINE 1 % RE OINT
TOPICAL_OINTMENT | RECTAL | Status: AC
  Filled 2015-02-17: qty 28

## 2015-02-17 MED ORDER — LIDOCAINE HCL (CARDIAC) 20 MG/ML IV SOLN
INTRAVENOUS | Status: AC
  Filled 2015-02-17: qty 5

## 2015-02-17 MED ORDER — PROPOFOL 10 MG/ML IV BOLUS
INTRAVENOUS | Status: AC
  Filled 2015-02-17: qty 20

## 2015-02-17 MED ORDER — LIDOCAINE HCL (CARDIAC) 20 MG/ML IV SOLN
INTRAVENOUS | Status: DC | PRN
  Administered 2015-02-17: 50 mg via INTRAVENOUS

## 2015-02-17 MED ORDER — SODIUM CHLORIDE 0.9 % IJ SOLN: 3.0000 mL | INTRAMUSCULAR | Status: DC | PRN

## 2015-02-17 MED ORDER — FENTANYL CITRATE (PF) 250 MCG/5ML IJ SOLN
INTRAMUSCULAR | Status: DC | PRN
  Administered 2015-02-17: 25 ug via INTRAVENOUS
  Administered 2015-02-17: 50 ug via INTRAVENOUS
  Administered 2015-02-17: 25 ug via INTRAVENOUS
  Administered 2015-02-17 (×5): 50 ug via INTRAVENOUS

## 2015-02-17 MED ORDER — SODIUM CHLORIDE 0.9 % IV SOLN: 250.0000 mL | INTRAVENOUS | Status: DC | PRN

## 2015-02-17 MED ORDER — DEXAMETHASONE SODIUM PHOSPHATE 10 MG/ML IJ SOLN
INTRAMUSCULAR | Status: AC
  Filled 2015-02-17: qty 1

## 2015-02-17 MED ORDER — SODIUM CHLORIDE 0.9 % IJ SOLN: 3.0000 mL | Freq: Two times a day (BID) | INTRAMUSCULAR | Status: DC

## 2015-02-17 MED ORDER — FENTANYL CITRATE (PF) 250 MCG/5ML IJ SOLN
INTRAMUSCULAR | Status: AC
  Filled 2015-02-17: qty 5

## 2015-02-17 MED ORDER — MEPERIDINE HCL 50 MG/ML IJ SOLN: 6.2500 mg | INTRAMUSCULAR | Status: DC | PRN

## 2015-02-17 MED ORDER — ONDANSETRON HCL 4 MG/2ML IJ SOLN
INTRAMUSCULAR | Status: DC | PRN
  Administered 2015-02-17: 4 mg via INTRAVENOUS

## 2015-02-17 MED ORDER — SODIUM CHLORIDE 0.9 % IJ SOLN
INTRAMUSCULAR | Status: AC
  Filled 2015-02-17: qty 20

## 2015-02-17 MED ORDER — OXYCODONE-ACETAMINOPHEN 7.5-325 MG PO TABS: 1.0000 | ORAL_TABLET | ORAL | Status: DC | PRN

## 2015-02-17 MED ORDER — MIDAZOLAM HCL 2 MG/2ML IJ SOLN: 0.5000 mg | Freq: Once | INTRAMUSCULAR | Status: DC | PRN

## 2015-02-17 MED ORDER — MORPHINE SULFATE (PF) 10 MG/ML IV SOLN: 2.0000 mg | INTRAVENOUS | Status: DC | PRN

## 2015-02-17 MED ORDER — SODIUM CHLORIDE 0.9 % IR SOLN
Status: DC | PRN
  Administered 2015-02-17: 1000 mL

## 2015-02-17 MED ORDER — HYDROMORPHONE HCL 1 MG/ML IJ SOLN
0.2500 mg | INTRAMUSCULAR | Status: DC | PRN
  Administered 2015-02-17: 0.5 mg via INTRAVENOUS

## 2015-02-17 MED ORDER — MIDAZOLAM HCL 2 MG/2ML IJ SOLN
INTRAMUSCULAR | Status: AC
  Filled 2015-02-17: qty 2

## 2015-02-17 MED ORDER — FENTANYL CITRATE (PF) 100 MCG/2ML IJ SOLN
INTRAMUSCULAR | Status: AC
  Filled 2015-02-17: qty 2

## 2015-02-17 MED ORDER — SODIUM CHLORIDE 0.9 % IJ SOLN
INTRAMUSCULAR | Status: AC
  Filled 2015-02-17: qty 50

## 2015-02-17 MED ORDER — ACETAMINOPHEN 650 MG RE SUPP
650.0000 mg | RECTAL | Status: DC | PRN
  Filled 2015-02-17: qty 1

## 2015-02-17 MED ORDER — SODIUM CHLORIDE 0.9 % IJ SOLN
INTRAMUSCULAR | Status: DC | PRN
Start: 2015-02-17 — End: 2015-02-17
  Administered 2015-02-17: 20 mL

## 2015-02-17 MED ORDER — PROPOFOL 10 MG/ML IV BOLUS
INTRAVENOUS | Status: DC | PRN
  Administered 2015-02-17: 200 mg via INTRAVENOUS
  Administered 2015-02-17: 50 mg via INTRAVENOUS

## 2015-02-17 MED ORDER — LACTATED RINGERS IV SOLN
INTRAVENOUS | Status: DC | PRN
  Administered 2015-02-17: 14:00:00 via INTRAVENOUS

## 2015-02-17 MED ORDER — ACETAMINOPHEN 325 MG PO TABS: 650.0000 mg | ORAL_TABLET | ORAL | Status: DC | PRN

## 2015-02-17 MED ORDER — PROMETHAZINE HCL 25 MG/ML IJ SOLN
INTRAMUSCULAR | Status: AC
  Filled 2015-02-17: qty 1

## 2015-02-17 MED ORDER — MIDAZOLAM HCL 5 MG/5ML IJ SOLN
INTRAMUSCULAR | Status: DC | PRN
  Administered 2015-02-17: 2 mg via INTRAVENOUS

## 2015-02-17 MED ORDER — HYDROMORPHONE HCL 1 MG/ML IJ SOLN
INTRAMUSCULAR | Status: AC
  Filled 2015-02-17: qty 1

## 2015-02-17 SURGICAL SUPPLY — 38 items
BLADE HEX COATED 2.75 (ELECTRODE) ×3 IMPLANT
BLADE SURG 15 STRL LF DISP TIS (BLADE) ×1 IMPLANT
BLADE SURG 15 STRL SS (BLADE) ×3
COVER SURGICAL LIGHT HANDLE (MISCELLANEOUS) ×3 IMPLANT
DECANTER SPIKE VIAL GLASS SM (MISCELLANEOUS) ×3 IMPLANT
DRAIN PENROSE 18X1/2 LTX STRL (DRAIN) IMPLANT
DRAPE SHEET LG 3/4 BI-LAMINATE (DRAPES) IMPLANT
DRSG PAD ABDOMINAL 8X10 ST (GAUZE/BANDAGES/DRESSINGS) ×2 IMPLANT
ELECT PENCIL ROCKER SW 15FT (MISCELLANEOUS) ×3 IMPLANT
ELECT REM PT RETURN 9FT ADLT (ELECTROSURGICAL) ×3
ELECTRODE REM PT RTRN 9FT ADLT (ELECTROSURGICAL) ×1 IMPLANT
GAUZE SPONGE 4X4 12PLY STRL (GAUZE/BANDAGES/DRESSINGS) ×2 IMPLANT
GAUZE SPONGE 4X4 16PLY XRAY LF (GAUZE/BANDAGES/DRESSINGS) ×3 IMPLANT
GLOVE BIO SURGEON STRL SZ7.5 (GLOVE) ×6 IMPLANT
GLOVE BIOGEL PI IND STRL 7.0 (GLOVE) ×1 IMPLANT
GLOVE BIOGEL PI INDICATOR 7.0 (GLOVE) ×2
GOWN STRL REUS W/ TWL XL LVL3 (GOWN DISPOSABLE) ×1 IMPLANT
GOWN STRL REUS W/TWL LRG LVL3 (GOWN DISPOSABLE) ×3 IMPLANT
GOWN STRL REUS W/TWL XL LVL3 (GOWN DISPOSABLE) ×10 IMPLANT
KIT BASIN OR (CUSTOM PROCEDURE TRAY) ×3 IMPLANT
LUBRICANT JELLY K Y 4OZ (MISCELLANEOUS) ×3 IMPLANT
NDL HYPO 25X1 1.5 SAFETY (NEEDLE) ×1 IMPLANT
NDL SAFETY ECLIPSE 18X1.5 (NEEDLE) ×1 IMPLANT
NEEDLE HYPO 18GX1.5 SHARP (NEEDLE) ×3
NEEDLE HYPO 25X1 1.5 SAFETY (NEEDLE) ×3 IMPLANT
NS IRRIG 1000ML POUR BTL (IV SOLUTION) ×3 IMPLANT
PACK LITHOTOMY IV (CUSTOM PROCEDURE TRAY) ×3 IMPLANT
SPONGE SURGIFOAM ABS GEL 100 (HEMOSTASIS) ×3 IMPLANT
SUT CHROMIC 2 0 SH (SUTURE) ×2 IMPLANT
SUT CHROMIC 3 0 SH 27 (SUTURE) IMPLANT
SUT SILK 3 0 SH 30 (SUTURE) ×6 IMPLANT
SYR CONTROL 10ML LL (SYRINGE) ×3 IMPLANT
TOWEL OR 17X26 10 PK STRL BLUE (TOWEL DISPOSABLE) ×3 IMPLANT
TUBING CONNECTING 10 (TUBING) ×2 IMPLANT
TUBING CONNECTING 10' (TUBING) ×1
UNDERPAD 30X30 INCONTINENT (UNDERPADS AND DIAPERS) ×3 IMPLANT
WATER STERILE IRR 1500ML POUR (IV SOLUTION) ×3 IMPLANT
YANKAUER SUCT BULB TIP 10FT TU (MISCELLANEOUS) ×3 IMPLANT

## 2015-02-17 NOTE — Anesthesia Postprocedure Evaluation (Signed)
Anesthesia Post Note  Patient: Rebekah Silva  Procedure(s) Performed: Procedure(s) (LRB): LATERAL INTERNAL SPHINCTEROTOMY (N/A) RECTAL EXAM UNDER ANESTHESIA (N/A)  Patient location during evaluation: PACU Anesthesia Type: General Level of consciousness: awake and alert, oriented and patient cooperative Pain management: pain level controlled Vital Signs Assessment: post-procedure vital signs reviewed and stable Respiratory status: spontaneous breathing, nonlabored ventilation and respiratory function stable Cardiovascular status: blood pressure returned to baseline and stable Postop Assessment: no signs of nausea or vomiting Anesthetic complications: no    Last Vitals:  Filed Vitals:   02/17/15 1600 02/17/15 1615  BP: 102/69 137/82  Pulse: 84 80  Temp: 36.8 C   Resp: 16 15    Last Pain:  Filed Vitals:   02/17/15 1621  PainSc: 1                  Arius Harnois,E. Georgetta Crafton

## 2015-02-17 NOTE — Anesthesia Preprocedure Evaluation (Addendum)
Anesthesia Evaluation  Patient identified by MRN, date of birth, ID band Patient awake    Reviewed: Allergy & Precautions, NPO status , Patient's Chart, lab work & pertinent test results  History of Anesthesia Complications Negative for: history of anesthetic complications  Airway Mallampati: I  TM Distance: >3 FB Neck ROM: Full    Dental  (+) Dental Advisory Given   Pulmonary neg pulmonary ROS,    breath sounds clear to auscultation       Cardiovascular (-) hypertensionnegative cardio ROS   Rhythm:Regular Rate:Normal     Neuro/Psych negative neurological ROS     GI/Hepatic Neg liver ROS, GERD  Medicated and Controlled,  Endo/Other  Morbid obesity  Renal/GU negative Renal ROS     Musculoskeletal   Abdominal (+) + obese,   Peds  Hematology negative hematology ROS (+)   Anesthesia Other Findings   Reproductive/Obstetrics mirena prophylaxis                          Anesthesia Physical Anesthesia Plan  ASA: II  Anesthesia Plan: General   Post-op Pain Management:    Induction: Intravenous  Airway Management Planned: LMA  Additional Equipment:   Intra-op Plan:   Post-operative Plan:   Informed Consent: I have reviewed the patients History and Physical, chart, labs and discussed the procedure including the risks, benefits and alternatives for the proposed anesthesia with the patient or authorized representative who has indicated his/her understanding and acceptance.   Dental advisory given  Plan Discussed with: CRNA and Surgeon  Anesthesia Plan Comments: (Plan routine monitors, GA- LMA OK)        Anesthesia Quick Evaluation

## 2015-02-17 NOTE — Op Note (Signed)
02/17/2015  2:47 PM  PATIENT:  Rebekah Silva  44 y.o. female  PRE-OPERATIVE DIAGNOSIS:  ANAL FISSURE  POST-OPERATIVE DIAGNOSIS:  ANAL FISSURE  PROCEDURE:  Procedure(s): LATERAL INTERNAL SPHINCTEROTOMY (N/A) RECTAL EXAM UNDER ANESTHESIA (N/A)  SURGEON:  Surgeon(s) and Role:    * Ralene Ok, MD - Primary ANESTHESIA:   local and general  EBL:  Total I/O In: 700 [I.V.:700] Out: 100 [Blood:50]  BLOOD ADMINISTERED:none  DRAINS: none   LOCAL MEDICATIONS USED:  BUPIVICAINE  and OTHER : exparil  SPECIMEN:  No Specimen  DISPOSITION OF SPECIMEN:  N/A  COUNTS:  YES  TOURNIQUET:  * No tourniquets in log *  DICTATION: .Dragon Dictation  After the patient was consented she was taken back to the operating room and placed in the supine position with bilateral SCDs in place. The patient underwent general endotracheal anesthesia. The patient was then placed in the lithotomy position. The patient was then prepped and draped in the usual sterile fashion. A timeout was called all facts were verified.  At this time a digital rectal exam again the procedure. There is no masses felt within the rectal vault. The anus was very spasmodic. At this time a half-moon retractor was placed into the rectal vault. It was easily seen that there was a posterior midline fissure. This was very friable in oozy. At this time the retraction of the left lateral anal canal was visualized. The internal and external ring were both palpated and visualized. At this time electrocautery was used to maintain hemostasis and incision of the internal sphincter was undertaken. This was approximately one quarter of the internal sphincter. Secondary to surrounding hemorrhoidal tissue there was some oozing. Hemostasis was achieved using figure-of-eight 3-0 silk's. At the end of the case was hemostatic. At this time the mucosa reapproximated using 3-0 chromic stitches in a running locking fashion. The midline fissure was  reexamined. This was cauterized to help with healing and hemostasis.  At this time a bupivacaine soaked rectal foam was placed within the wound. Exparil was used to infiltrate the surrounding perianal area.  at this time the anus was dressed with 4 x 4's, AVD pad, and mesh panties. The patient tied the procedure well was taken to the recovery room in stable condition.   PLAN OF CARE: Discharge to home after PACU  PATIENT DISPOSITION:  PACU - hemodynamically stable.   Delay start of Pharmacological VTE agent (>24hrs) due to surgical blood loss or risk of bleeding: not applicable

## 2015-02-17 NOTE — H&P (Signed)
History of Present Illness Rebekah Ok MD; 02/16/2015 4:00 PM) Patient words: f/u anal fissure.  The patient is a 44 year old female who presents with an anal fissure. Patient is a 44 year old female who is referred by Dr. Lizbeth Bark for evaluation of anal fissure. Patient states she had a colonoscopy approximately a week ago.  Patient was recently seen in in clinic for anal fissure. Patient was treated with diltiazem ointment. She states that it did begin improving however she feels that they're did recur she's had continued spasm-like pain. She states sharp in nature. She does not have some bleeding at times.  Patient works as a Radio producer and is unsure when she can make it to work on day-to-day basis secondary to pain.   Allergies Malachi Bonds, CMA; 02/16/2015 3:42 PM) Penicillin V *PENICILLINS* Aspirin *ANALGESICS - NonNarcotic* Codeine Phosphate *ANALGESICS - OPIOID*  Medication History (Chemira Jones, CMA; 02/16/2015 3:42 PM) OxyCODONE HCl (5MG Tablet, 1 (one) Tablet Oral every four hours, as needed, Taken starting 01/05/2015) Active. [facility] Gentle Laxative (5MG Tablet DR, Oral) Active. Lidocaine (5% Ointment, External) Active. Pravastatin Sodium (40MG Tablet, Oral) Active. Uceris [facility] Tablet ER 24HR, Oral) Active. Topiramate (100MG Tablet, Oral) Active. Lialda (1.2GM Tablet DR, Oral) Active. Rizatriptan Benzoate (10MG Tablet Disperse, Oral) Active. PriLOSEC OTC (20MG Tablet DR, Oral) Active. PEG 3350-KCl-Na Bicarb-NaCl (420GM For Solution, Oral) Active.    Review of Systems Rebekah Ok, MD; 02/16/2015 3:59 PM) General Not Present- Appetite Loss, Chills, Fatigue, Fever, Night Sweats, Weight Gain and Weight Loss. Skin Present- New Lesions. Not Present- Change in Wart/Mole, Dryness, Hives, Jaundice, Non-Healing Wounds, Rash and Ulcer. HEENT Present- Seasonal Allergies and Wears glasses/contact lenses. Not Present- Earache, Hearing Loss, Hoarseness, Nose  Bleed, Oral Ulcers, Ringing in the Ears, Sinus Pain, Sore Throat, Visual Disturbances and Yellow Eyes. Respiratory Present- Snoring. Not Present- Bloody sputum, Chronic Cough, Difficulty Breathing and Wheezing. Breast Not Present- Breast Mass, Breast Pain, Nipple Discharge and Skin Changes. Cardiovascular Not Present- Chest Pain, Difficulty Breathing Lying Down, Leg Cramps, Palpitations, Rapid Heart Rate, Shortness of Breath and Swelling of Extremities. Gastrointestinal Present- Chronic diarrhea. Not Present- Abdominal Pain, Bloating, Bloody Stool, Change in Bowel Habits, Constipation, Difficulty Swallowing, Excessive gas, Gets full quickly at meals, Hemorrhoids, Indigestion, Nausea, Rectal Pain and Vomiting. Female Genitourinary Not Present- Frequency, Nocturia, Painful Urination, Pelvic Pain and Urgency. Musculoskeletal Not Present- Back Pain, Joint Pain, Joint Stiffness, Muscle Pain, Muscle Weakness and Swelling of Extremities. Neurological Present- Headaches. Not Present- Decreased Memory, Fainting, Numbness, Seizures, Tingling, Tremor, Trouble walking and Weakness. Psychiatric Not Present- Anxiety, Bipolar, Change in Sleep Pattern, Depression, Fearful and Frequent crying. Endocrine Not Present- Cold Intolerance, Excessive Hunger, Hair Changes, Heat Intolerance, Hot flashes and New Diabetes. Hematology Not Present- Easy Bruising, Excessive bleeding, Gland problems, HIV and Persistent Infections.  Vitals (Chemira Jones CMA; 02/16/2015 3:42 PM) 02/16/2015 3:41 PM Weight: 267.2 lb Temp.: 98.59F(Oral)  BP: 134/72 (Sitting, Left Arm, Standard)       Physical Exam Rebekah Ok, MD; 02/16/2015 3:59 PM) General Mental Status-Alert. General Appearance-Consistent with stated age. Hydration-Well hydrated. Voice-Normal.  Chest and Lung Exam Chest and lung exam reveals -quiet, even and easy respiratory effort with no use of accessory muscles and on auscultation, normal breath  sounds, no adventitious sounds and normal vocal resonance. Inspection Chest Wall - Normal. Back - normal.  Cardiovascular Cardiovascular examination reveals -normal heart sounds, regular rate and rhythm with no murmurs and normal pedal pulses bilaterally.  Rectal Note: Deferred secondary to pain.  Assessment & Plan Rebekah Ok MD; 02/16/2015 4:01 PM) ACUTE ANAL FISSURE (K60.0) Impression: 44 year old female with acute anal fissure. 1. Will proceed to the operating for exam under anesthesia and lateral internal sphincterotomy. 2. I discussed with the patient the risks and benefits of the procedure to include but not limited to: Infection, bleeding, damage to surrounding structures, possible incontinence. The patient was understanding and wishes to proceed.

## 2015-02-17 NOTE — Discharge Instructions (Signed)
ANORECTAL SURGERY:  POST OPERATIVE INSTRUCTIONS  1. Take your usually prescribed home medications unless otherwise directed. 2. DIET: Follow a light bland diet the first 24 hours after arrival home, such as soup, liquids, crackers, etc.  Be sure to include lots of fluids daily.  Avoid fast food or heavy meals as your are more likely to get nauseated.  Eat a low fat the next few days after surgery.   3. PAIN CONTROL: a. Pain is best controlled by a usual combination of three different methods TOGETHER: i. Ice/Heat ii. Over the counter pain medication iii. Prescription pain medication b. Most patients will experience some swelling and discomfort in the anus/rectal area. and incisions.  Ice packs or heat (30-60 minutes up to 6 times a day) will help. Use ice for the first few days to help decrease swelling and bruising, then switch to heat such as warm towels, sitz baths, warm baths, etc to help relax tight/sore spots and speed recovery.  Some people prefer to use ice alone, heat alone, alternating between ice & heat.  Experiment to what works for you.  Swelling and bruising can take several weeks to resolve.   c. It is helpful to take an over-the-counter pain medication regularly for the first few weeks.  Choose one of the following that works best for you: i. Naproxen (Aleve, etc)  Two 288m tabs twice a day ii. Ibuprofen (Advil, etc) Three 2079mtabs four times a day (every meal & bedtime) iii. Acetaminophen (Tylenol, etc) 500-6507mour times a day (every meal & bedtime) d. A  prescription for pain medication (such as oxycodone, hydrocodone, etc) should be given to you upon discharge.  Take your pain medication as prescribed.  i. If you are having problems/concerns with the prescription medicine (does not control pain, nausea, vomiting, rash, itching, etc), please call us [phone] see if we need to switch you to a different pain medicine that will work better for you and/or control your  side effect better. ii. If you need a refill on your pain medication, please contact your pharmacy.  They will contact our office to request authorization. Prescriptions will not be filled after 5 pm or on week-ends.  Use a Sitz Bath 4-8 times a day for relief   SitCSX Corpor[employer]itz bath is a warm water bath taken in the sitting position that covers only the hips and buttocks. It may be used for either healing or hygiene purposes. Sitz baths are also used to relieve pain, itching, or muscle spasms. The water may contain medicine. Moist heat will help you heal and relax.  HOME CARE INSTRUCTIONS  Take 3 to 4 sitz baths a day. 1. Fill the bathtub half full with warm water. 2. Sit in the water and open the drain a little. 3. Turn on the warm water to keep the tub half full. Keep the water running constantly. 4. Soak in the water for 15 to 20 minutes. 5. After the sitz bath, pat the affected area dry first.   4. KEEP YOUR BOWELS REGULAR a. The goal is one bowel movement a day b. Avoid getting constipated.  Between the surgery and the pain medications, it is common to experience some constipation.  Increasing fluid intake and taking a fiber supplement (such as Metamucil, Citrucel, FiberCon, MiraLax, etc) 1-2 times a day regularly will usually help prevent this problem from occurring.  A mild laxative (prune juice, Milk of Magnesia, MiraLax, etc) should be taken according to package  directions if there are no bowel movements after 48 hours. c. Watch out for diarrhea.  If you have many loose bowel movements, simplify your diet to bland foods & liquids for a few days.  Stop any stool softeners and decrease your fiber supplement.  Switching to mild anti-diarrheal medications (Kayopectate, Pepto Bismol) can help.  If this worsens or does not improve, please call us.  5. Wound Care a. Remove your bandages the day after surgery.  Unless discharge instructions indicate otherwise, leave your bandage dry and in  place overnight.  Remove the bandage during your first bowel movement.   b. Allow the wound packing to fall out over the next few days.  You can trim exposed gauze / ribbon as it falls out.  You do not need to repack the wound unless instructed otherwise.  Wear an absorbent pad or soft cotton gauze in your underwear as needed to catch any drainage and help keep the area  c. Keep the area clean and dry.  Bathe / shower every day.  Keep the area clean by showering / bathing over the incision / wound.   It is okay to soak an open wound to help wash it.  Wet wipes or showers / gentle washing after bowel movements is often less traumatic than regular toilet paper. d. Dennis Bast may have some styrofoam-like soft packing in the rectum which will come out with the first bowel movement.  e. You will often notice bleeding with bowel movements.  This should slow down by the end of the first week of surgery f. Expect some drainage.  This should slow down, too, by the end of the first week of surgery.  Wear an absorbent pad or soft cotton gauze in your underwear until the drainage stops. 6. ACTIVITIES as tolerated:   a. You may resume regular (light) daily activities beginning the next day--such as daily self-care, walking, climbing stairs--gradually increasing activities as tolerated.  If you can walk 30 minutes without difficulty, it is safe to try more intense activity such as jogging, treadmill, bicycling, low-impact aerobics, swimming, etc. b. Save the most intensive and strenuous activity for last such as sit-ups, heavy lifting, contact sports, etc  Refrain from any heavy lifting or straining until you are off narcotics for pain control.   c. DO NOT PUSH THROUGH PAIN.  Let pain be your guide: If it hurts to do something, don't do it.  Pain is your body warning you to avoid that activity for another week until the pain goes down. d. You may drive when you are no longer taking prescription pain medication, you can  comfortably sit for long periods of time, and you can safely maneuver your car and apply brakes. e. Dennis Bast may have sexual intercourse when it is comfortable.  7. FOLLOW UP in our office a. Please call [facility] at [phone] to set up an appointment to see your surgeon in the office for a follow-up appointment approximately 2 weeks after your surgery. b. Make sure that you call for this appointment the day you arrive home to insure a convenient appointment time. 10. IF YOU HAVE DISABILITY OR FAMILY LEAVE FORMS, BRING THEM TO THE OFFICE FOR PROCESSING.  DO NOT GIVE THEM TO YOUR DOCTOR.        WHEN TO CALL us [phone]: 1. Poor pain control 2. Reactions / problems with new medications (rash/itching, nausea, etc)  3. Fever over 101.5 F (38.5 C) 4. Inability to urinate 5. Nausea and/or  vomiting 6. Worsening swelling or bruising 7. Continued bleeding from incision. 8. Increased pain, redness, or drainage from the incision  The clinic staff is available to answer your questions during regular business hours (8:30am-5pm).  Please dont hesitate to call and ask to speak to one of our nurses for clinical concerns.   A surgeon from [facility] is always on call at the hospitals   If you have a medical emergency, go to the nearest emergency room or call 911.    [facility], [place], [place], [place], [place]  [address] ? MAIN: [phone] ? TOLL FREE: [phone] ? FAX [phone] [email or web address]

## 2015-02-17 NOTE — Anesthesia Procedure Notes (Signed)
Procedure Name: LMA Insertion Date/Time: 02/17/2015 2:09 PM Performed by: Dione Booze Pre-anesthesia Checklist: Patient identified, Emergency Drugs available, Suction available and Patient being monitored Patient Re-evaluated:Patient Re-evaluated prior to inductionOxygen Delivery Method: Circle system utilized Preoxygenation: Pre-oxygenation with 100% oxygen Intubation Type: IV induction LMA: LMA inserted LMA Size: 4.0 Number of attempts: 1 Placement Confirmation: positive ETCO2 Tube secured with: Tape Dental Injury: Teeth and Oropharynx as per pre-operative assessment

## 2015-02-17 NOTE — Transfer of Care (Signed)
Immediate Anesthesia Transfer of Care Note  Patient: Rebekah Silva  Procedure(s) Performed: Procedure(s): LATERAL INTERNAL SPHINCTEROTOMY (N/A) RECTAL EXAM UNDER ANESTHESIA (N/A)  Patient Location: PACU  Anesthesia Type:General  Level of Consciousness: awake, alert , oriented and patient cooperative  Airway & Oxygen Therapy: Patient Spontanous Breathing and Patient connected to face mask oxygen  Post-op Assessment: Report given to RN and Post -op Vital signs reviewed and stable  Post vital signs: Reviewed and stable  Last Vitals:  Filed Vitals:   02/17/15 1249  BP: 122/78  Pulse: 91  Temp: 36.8 C  Resp: 16    Complications: No apparent anesthesia complications

## 2015-02-18 ENCOUNTER — Encounter (HOSPITAL_COMMUNITY): Payer: Self-pay | Admitting: General Surgery

## 2015-04-02 ENCOUNTER — Ambulatory Visit: Payer: Self-pay | Admitting: General Surgery

## 2015-04-02 NOTE — H&P (Signed)
Rebekah Silva is an 45 y.o. female.   Chief Complaint: anal pain HPI: patient is a 45 year old female status post lateral internal sphincterotomy for anal fissure.  Subsequently patient has been seen in clinicwith continued pain and bleeding.  Patient has hypertension of the internal anal sphincter.  Past Medical History  Diagnosis Date  . Colitis   . Hypercholesterolemia     DR. WHITE  . Migraines   . PIH (pregnancy induced hypertension) yrs ago, none since  . GERD (gastroesophageal reflux disease)   . Arthritis     ra    Past Surgical History  Procedure Laterality Date  . Cesarean section  2007  . Mouth surgery    . Intrauterine device insertion  12/15/2005  . Sphincterotomy N/A 02/17/2015    Procedure: LATERAL INTERNAL SPHINCTEROTOMY;  Surgeon: Ralene Ok, MD;  Location: [facility];  Service: General;  Laterality: N/A;  . Rectal exam under anesthesia N/A 02/17/2015    Procedure: RECTAL EXAM UNDER ANESTHESIA;  Surgeon: Ralene Ok, MD;  Location: [facility];  Service: General;  Laterality: N/A;    Family History  Problem Relation Age of Onset  . Hypertension Father   . Diabetes Paternal Grandmother   . Heart disease Paternal Grandmother   . Cancer Maternal Grandfather     colon?   Social History:  reports that she has never smoked. She has never used smokeless tobacco. She reports that she drinks alcohol. She reports that she does not use illicit drugs.  Allergies:  Allergies  Allergen Reactions  . Aspirin     Ulcerative coalitis  . Codeine     Severe headaches  . Other     PEANUTS severe migraines  . Penicillins     rash Has patient had a PCN reaction causing immediate rash, facial/tongue/throat swelling, SOB or lightheadedness with hypotension: unknown Has patient had a PCN reaction causing severe rash involving mucus membranes or skin necrosis: unknown Has patient had a PCN reaction that required hospitalization unknown Has patient had a PCN reaction occurring  within the last 10 years: unknown If all of the above answers are "NO", then may proceed with Cephalosporin use.      (Not in a hospital admission)  No results found for this or any previous visit (from the past 48 hour(s)). No results found.  Review of Systems  Cardiovascular: Negative.   Gastrointestinal: Positive for blood in stool.  Skin: Negative.     There were no vitals taken for this visit. Physical Exam  Constitutional: She is oriented to person, place, and time. She appears well-developed and well-nourished.  HENT:  Head: Normocephalic and atraumatic.  Eyes: Conjunctivae and EOM are normal. Pupils are equal, round, and reactive to light.  Neck: Normal range of motion.  Cardiovascular: Normal rate, regular rhythm and normal heart sounds.   Respiratory: Effort normal and breath sounds normal.  GI: Soft. Bowel sounds are normal.  Genitourinary: Rectal exam shows anal tone abnormal (hypertensive).  Musculoskeletal: Normal range of motion.  Neurological: She is alert and oriented to person, place, and time.     Assessment/Plan 45 year old female with anal fissure  1.  Will proceed to the operating room for exam under anesthesia and chemical denervation of the internal anal sphincter. 2.  I discussed with the patient risks and benefits of the procedure to include but not limited to: Infection, bleed to surrounding structures, possible incontinence.  The patient was understanding and wishes to proceed.  Rosario Jacks., Nuria Phebus 04/02/2015, 4:49 PM

## 2015-04-07 ENCOUNTER — Encounter (HOSPITAL_COMMUNITY): Payer: Self-pay

## 2015-04-07 ENCOUNTER — Encounter (HOSPITAL_COMMUNITY)
Admission: RE | Admit: 2015-04-07 | Discharge: 2015-04-07 | Disposition: A | Discharge status: Home / Self Care | Payer: BC Managed Care – PPO | Source: Ambulatory Visit | Attending: General Surgery | Admitting: General Surgery

## 2015-04-07 DIAGNOSIS — I10 Essential (primary) hypertension: Secondary | ICD-10-CM | POA: Diagnosis not present

## 2015-04-07 DIAGNOSIS — K602 Anal fissure, unspecified: Secondary | ICD-10-CM | POA: Diagnosis present

## 2015-04-07 DIAGNOSIS — Z6839 Body mass index (BMI) 39.0-39.9, adult: Secondary | ICD-10-CM | POA: Diagnosis not present

## 2015-04-07 DIAGNOSIS — E669 Obesity, unspecified: Secondary | ICD-10-CM | POA: Diagnosis not present

## 2015-04-07 HISTORY — DX: Nausea with vomiting, unspecified: R11.2

## 2015-04-07 HISTORY — DX: Presence of spectacles and contact lenses: Z97.3

## 2015-04-07 HISTORY — DX: Other specified postprocedural states: Z98.890

## 2015-04-07 LAB — CBC
HCT: 46.3 % — ABNORMAL HIGH (ref 36.0–46.0)
HEMOGLOBIN: 15.8 g/dL — AB (ref 12.0–15.0)
MCH: 30.9 pg (ref 26.0–34.0)
MCHC: 34.1 g/dL (ref 30.0–36.0)
MCV: 90.4 fL (ref 78.0–100.0)
PLATELETS: 363 10*3/uL (ref 150–400)
RBC: 5.12 MIL/uL — ABNORMAL HIGH (ref 3.87–5.11)
RDW: 13.5 % (ref 11.5–15.5)
WBC: 7.9 10*3/uL (ref 4.0–10.5)

## 2015-04-07 LAB — BASIC METABOLIC PANEL
Anion gap: 12 (ref 5–15)
BUN: 11 mg/dL (ref 6–20)
CALCIUM: 10.1 mg/dL (ref 8.9–10.3)
CO2: 23 mmol/L (ref 22–32)
CREATININE: 0.95 mg/dL (ref 0.44–1.00)
Chloride: 104 mmol/L (ref 101–111)
Glucose, Bld: 100 mg/dL — ABNORMAL HIGH (ref 65–99)
Potassium: 3.3 mmol/L — ABNORMAL LOW (ref 3.5–5.1)
SODIUM: 139 mmol/L (ref 135–145)

## 2015-04-07 LAB — HCG, SERUM, QUALITATIVE: PREG SERUM: NEGATIVE

## 2015-04-07 NOTE — Pre-Procedure Instructions (Signed)
    Rebekah Silva  04/07/2015      [id number] [facility] [address]Phone: [phone]: [phone] [facility] [address]Phone: [phone]: [phone]   Your procedure is scheduled on Friday, April 09, 2015  [facility]  Call this number if you have problems the morning of surgery:  [phone]   Remember:  Do not eat food or drink liquids after midnight Thursday, April 08, 2015  Take these medicines the morning of surgery with A SIP OF WATER : cetirizine (ZYRTEC), mesalamine (LIALDA), omeprazole (PRILOSEC), if needed: pain medication  Stop taking Aspirin, vitamins, fish oil and herbal medications. Do not take any NSAIDs ie: Ibuprofen, Advil, Naproxen, BC and Goody Powder or any medication containing Aspirin; stop now.  Do not wear jewelry, make-up or nail polish.  Do not wear lotions, powders, or perfumes.  You may not wear deodorant.  Do not shave 48 hours prior to surgery.    Do not bring valuables to the hospital.  [facility] or [facility].  Contacts, dentures or bridgework may not be worn into surgery.  Leave your suitcase in the car.  After surgery it may be brought to your room.  For patients admitted to the hospital, discharge time will be determined by your treatment team.  Patients discharged the day of surgery will not be allowed to drive home.   Name and phone number of your driver:   Special instructions: Shower the night before surgery and the morning of surgery with CHG.  Please read over the following fact sheets that you were given. Pain Booklet, Coughing and Deep Breathing and Surgical Site Infection Prevention

## 2015-04-07 NOTE — Progress Notes (Signed)
Pt denies SOB, chest pain, and being under the care of a cardiologist. Pt denies having a stress test and cardiac cath. Pt denies having a chest x ray within the last year. Spoke with Willeen Cass, NP, Anesthesia, regarding labs, okay to draw a CBC and BMP.

## 2015-04-08 MED ORDER — CHLORHEXIDINE GLUCONATE 4 % EX LIQD: 1.0000 "application " | Freq: Once | CUTANEOUS | Status: DC

## 2015-04-08 NOTE — Progress Notes (Signed)
Anesthesia Chart Review: Patient is a 45 year old female scheduled for chemical denervation of internal anal sphincter on 04/09/15 by Dr. Rosendo Gros. DX: Anal fissure.  History includes non-smoker, post-operative N/V, migraines, hypercholesterolemia, pregnancy induced hypertension, GERD, arthritis, oral surgery, s/p lateral internal sphincterotomy 02/17/15, obesity (BMI 39.99).   Meds include Zyrtec, Mirena, mesalamine, Prilosec, Oxy IR, Pravachol, Maxalt, Topamax, tramadol.  04/16/14 EKG (done during ED visit for dizziness/nausea): SR, borderline repolarization abnormality. Tracing reads out ST elevation, consider inferior injury, but there is significant motion artifact (worse in I, II, III) and baseline wanderer in limb leads. I think the artifact accounts for the interpretation. Troponin was negative. Denied SOB, CP at PAT.  Echo in 2004 showed normal EF, no significant valvular disease.  Preoperative labs noted.   If no acute changes then I anticipate that she can proceed as planned.  George Hugh [facility]/Anesthesiology Phone [phone] 04/08/2015 9:52 AM

## 2015-04-09 ENCOUNTER — Ambulatory Visit (HOSPITAL_COMMUNITY)
Admission: RE | Admit: 2015-04-09 | Discharge: 2015-04-09 | Disposition: A | Discharge status: Home / Self Care | Payer: BC Managed Care – PPO | Source: Ambulatory Visit | Attending: General Surgery | Admitting: General Surgery

## 2015-04-09 ENCOUNTER — Ambulatory Visit (HOSPITAL_COMMUNITY): Payer: BC Managed Care – PPO | Admitting: Anesthesiology

## 2015-04-09 ENCOUNTER — Encounter (HOSPITAL_COMMUNITY)
Admission: RE | Disposition: A | Discharge status: Home / Self Care | Payer: Self-pay | Source: Ambulatory Visit | Attending: General Surgery

## 2015-04-09 ENCOUNTER — Encounter (HOSPITAL_COMMUNITY): Payer: Self-pay | Admitting: *Deleted

## 2015-04-09 ENCOUNTER — Ambulatory Visit (HOSPITAL_COMMUNITY): Payer: BC Managed Care – PPO | Admitting: Emergency Medicine

## 2015-04-09 DIAGNOSIS — Z6839 Body mass index (BMI) 39.0-39.9, adult: Secondary | ICD-10-CM | POA: Insufficient documentation

## 2015-04-09 DIAGNOSIS — E669 Obesity, unspecified: Secondary | ICD-10-CM | POA: Insufficient documentation

## 2015-04-09 DIAGNOSIS — K602 Anal fissure, unspecified: Secondary | ICD-10-CM | POA: Diagnosis not present

## 2015-04-09 DIAGNOSIS — I10 Essential (primary) hypertension: Secondary | ICD-10-CM | POA: Insufficient documentation

## 2015-04-09 HISTORY — PX: ANAL FISSURE REPAIR: SHX2312

## 2015-04-09 SURGERY — REPAIR, FISSURE, ANAL
Anesthesia: General | Site: Anus

## 2015-04-09 MED ORDER — FENTANYL CITRATE (PF) 100 MCG/2ML IJ SOLN
25.0000 ug | INTRAMUSCULAR | Status: DC | PRN
  Administered 2015-04-09: 25 ug via INTRAVENOUS

## 2015-04-09 MED ORDER — SODIUM CHLORIDE 0.9% FLUSH: 3.0000 mL | Freq: Two times a day (BID) | INTRAVENOUS | Status: DC

## 2015-04-09 MED ORDER — FENTANYL CITRATE (PF) 100 MCG/2ML IJ SOLN
INTRAMUSCULAR | Status: AC
  Filled 2015-04-09: qty 2

## 2015-04-09 MED ORDER — MIDAZOLAM HCL 2 MG/2ML IJ SOLN
INTRAMUSCULAR | Status: AC
  Filled 2015-04-09: qty 2

## 2015-04-09 MED ORDER — ONABOTULINUMTOXINA 100 UNITS IJ SOLR
INTRAMUSCULAR | Status: DC | PRN
  Administered 2015-04-09: 100 [IU] via INTRAMUSCULAR

## 2015-04-09 MED ORDER — LIDOCAINE HCL (CARDIAC) 20 MG/ML IV SOLN
INTRAVENOUS | Status: AC
  Filled 2015-04-09: qty 5

## 2015-04-09 MED ORDER — ONDANSETRON HCL 4 MG/2ML IJ SOLN
INTRAMUSCULAR | Status: AC
  Filled 2015-04-09: qty 2

## 2015-04-09 MED ORDER — PROMETHAZINE HCL 25 MG/ML IJ SOLN
INTRAMUSCULAR | Status: AC
  Filled 2015-04-09: qty 1

## 2015-04-09 MED ORDER — DIBUCAINE 1 % RE OINT
TOPICAL_OINTMENT | RECTAL | Status: AC
  Filled 2015-04-09: qty 28

## 2015-04-09 MED ORDER — DEXAMETHASONE SODIUM PHOSPHATE 4 MG/ML IJ SOLN
INTRAMUSCULAR | Status: AC
  Filled 2015-04-09: qty 2

## 2015-04-09 MED ORDER — ACETAMINOPHEN 650 MG RE SUPP
650.0000 mg | RECTAL | Status: DC | PRN
  Filled 2015-04-09: qty 1

## 2015-04-09 MED ORDER — LIDOCAINE HCL (CARDIAC) 20 MG/ML IV SOLN
INTRAVENOUS | Status: DC | PRN
  Administered 2015-04-09: 100 mg via INTRAVENOUS

## 2015-04-09 MED ORDER — ACETAMINOPHEN 325 MG PO TABS
650.0000 mg | ORAL_TABLET | ORAL | Status: DC | PRN
  Filled 2015-04-09: qty 2

## 2015-04-09 MED ORDER — BUPIVACAINE-EPINEPHRINE (PF) 0.5% -1:200000 IJ SOLN
INTRAMUSCULAR | Status: AC
  Filled 2015-04-09: qty 30

## 2015-04-09 MED ORDER — OXYCODONE HCL 5 MG PO TABS: 5.0000 mg | ORAL_TABLET | ORAL | Status: DC | PRN

## 2015-04-09 MED ORDER — OXYCODONE-ACETAMINOPHEN 5-325 MG PO TABS: 1.0000 | ORAL_TABLET | ORAL | Status: DC | PRN

## 2015-04-09 MED ORDER — BUPIVACAINE LIPOSOME 1.3 % IJ SUSP
20.0000 mL | INTRAMUSCULAR | Status: AC
  Administered 2015-04-09: 266 mg
  Filled 2015-04-09: qty 20

## 2015-04-09 MED ORDER — SODIUM CHLORIDE 0.9% FLUSH: 3.0000 mL | INTRAVENOUS | Status: DC | PRN

## 2015-04-09 MED ORDER — MIDAZOLAM HCL 5 MG/5ML IJ SOLN
INTRAMUSCULAR | Status: DC | PRN
  Administered 2015-04-09 (×2): 2 mg via INTRAVENOUS

## 2015-04-09 MED ORDER — SODIUM CHLORIDE 0.9 % IV SOLN: 250.0000 mL | INTRAVENOUS | Status: DC | PRN

## 2015-04-09 MED ORDER — ARTIFICIAL TEARS OP OINT
TOPICAL_OINTMENT | OPHTHALMIC | Status: AC
  Filled 2015-04-09: qty 3.5

## 2015-04-09 MED ORDER — MORPHINE SULFATE (PF) 2 MG/ML IV SOLN: 2.0000 mg | INTRAVENOUS | Status: DC | PRN

## 2015-04-09 MED ORDER — PROPOFOL 10 MG/ML IV BOLUS
INTRAVENOUS | Status: DC | PRN
  Administered 2015-04-09: 200 mg via INTRAVENOUS

## 2015-04-09 MED ORDER — LIDOCAINE 5 % EX OINT: 1.0000 "application " | TOPICAL_OINTMENT | CUTANEOUS | Status: DC | PRN

## 2015-04-09 MED ORDER — LACTATED RINGERS IV SOLN
INTRAVENOUS | Status: DC
  Administered 2015-04-09: 12:00:00 via INTRAVENOUS

## 2015-04-09 MED ORDER — ARTIFICIAL TEARS OP OINT
TOPICAL_OINTMENT | OPHTHALMIC | Status: DC | PRN
  Administered 2015-04-09: 1 via OPHTHALMIC

## 2015-04-09 MED ORDER — FENTANYL CITRATE (PF) 100 MCG/2ML IJ SOLN
INTRAMUSCULAR | Status: DC | PRN
  Administered 2015-04-09 (×5): 50 ug via INTRAVENOUS

## 2015-04-09 MED ORDER — 0.9 % SODIUM CHLORIDE (POUR BTL) OPTIME
TOPICAL | Status: DC | PRN
  Administered 2015-04-09: 1000 mL

## 2015-04-09 MED ORDER — DEXAMETHASONE SODIUM PHOSPHATE 4 MG/ML IJ SOLN
INTRAMUSCULAR | Status: DC | PRN
  Administered 2015-04-09: 8 mg via INTRAVENOUS

## 2015-04-09 MED ORDER — ONDANSETRON HCL 4 MG/2ML IJ SOLN
INTRAMUSCULAR | Status: DC | PRN
  Administered 2015-04-09: 4 mg via INTRAVENOUS

## 2015-04-09 MED ORDER — PROMETHAZINE HCL 25 MG/ML IJ SOLN
6.2500 mg | INTRAMUSCULAR | Status: DC | PRN
  Administered 2015-04-09: 6.25 mg via INTRAVENOUS

## 2015-04-09 MED ORDER — DIBUCAINE 1 % RE OINT
TOPICAL_OINTMENT | RECTAL | Status: DC | PRN
  Administered 2015-04-09: 1 via RECTAL

## 2015-04-09 MED ORDER — FENTANYL CITRATE (PF) 250 MCG/5ML IJ SOLN
INTRAMUSCULAR | Status: AC
  Filled 2015-04-09: qty 5

## 2015-04-09 SURGICAL SUPPLY — 47 items
BLADE SURG 15 STRL LF DISP TIS (BLADE) IMPLANT
BLADE SURG 15 STRL SS (BLADE) ×3
CONT SPEC 4OZ CLIKSEAL STRL BL (MISCELLANEOUS) ×3 IMPLANT
COVER SURGICAL LIGHT HANDLE (MISCELLANEOUS) ×3 IMPLANT
DRAPE PROXIMA HALF (DRAPES) ×3 IMPLANT
DRAPE UTILITY XL STRL (DRAPES) ×6 IMPLANT
DRSG PAD ABDOMINAL 8X10 ST (GAUZE/BANDAGES/DRESSINGS) ×3 IMPLANT
ELECT CAUTERY BLADE 6.4 (BLADE) ×3 IMPLANT
ELECT REM PT RETURN 9FT ADLT (ELECTROSURGICAL) ×3
ELECTRODE REM PT RTRN 9FT ADLT (ELECTROSURGICAL) ×1 IMPLANT
GAUZE SPONGE 4X4 12PLY STRL (GAUZE/BANDAGES/DRESSINGS) ×3 IMPLANT
GAUZE SPONGE 4X4 16PLY XRAY LF (GAUZE/BANDAGES/DRESSINGS) ×5 IMPLANT
GLOVE BIO SURGEON STRL SZ7.5 (GLOVE) ×3 IMPLANT
GLOVE BIOGEL PI IND STRL 7.5 (GLOVE) IMPLANT
GLOVE BIOGEL PI IND STRL 8 (GLOVE) ×1 IMPLANT
GLOVE BIOGEL PI INDICATOR 7.5 (GLOVE) ×2
GLOVE BIOGEL PI INDICATOR 8 (GLOVE) ×2
GLOVE SURG SS PI 6.5 STRL IVOR (GLOVE) ×2 IMPLANT
GLOVE SURG SS PI 7.5 STRL IVOR (GLOVE) ×2 IMPLANT
GOWN STRL REUS W/ TWL LRG LVL3 (GOWN DISPOSABLE) ×2 IMPLANT
GOWN STRL REUS W/ TWL XL LVL3 (GOWN DISPOSABLE) ×1 IMPLANT
GOWN STRL REUS W/TWL LRG LVL3 (GOWN DISPOSABLE) ×3
GOWN STRL REUS W/TWL XL LVL3 (GOWN DISPOSABLE) ×3
KIT BASIN OR (CUSTOM PROCEDURE TRAY) ×3 IMPLANT
KIT ROOM TURNOVER OR (KITS) ×3 IMPLANT
LOOP VESSEL MAXI BLUE (MISCELLANEOUS) IMPLANT
NDL 18GX1X1/2 (RX/OR ONLY) (NEEDLE) IMPLANT
NDL HYPO 25GX1X1/2 BEV (NEEDLE) ×1 IMPLANT
NEEDLE 18GX1X1/2 (RX/OR ONLY) (NEEDLE) ×3 IMPLANT
NEEDLE HYPO 25GX1X1/2 BEV (NEEDLE) ×6 IMPLANT
NS IRRIG 1000ML POUR BTL (IV SOLUTION) ×3 IMPLANT
PACK LITHOTOMY IV (CUSTOM PROCEDURE TRAY) ×3 IMPLANT
PAD ARMBOARD 7.5X6 YLW CONV (MISCELLANEOUS) ×6 IMPLANT
PENCIL BUTTON HOLSTER BLD 10FT (ELECTRODE) ×3 IMPLANT
SHEARS HARMONIC 9CM CVD (BLADE) ×1 IMPLANT
SPECIMEN JAR SMALL (MISCELLANEOUS) ×3 IMPLANT
SPONGE HEMORRHOID 8X3CM (HEMOSTASIS) IMPLANT
SPONGE SURGIFOAM ABS GEL 100 (HEMOSTASIS) ×3 IMPLANT
SURGILUBE 2OZ TUBE FLIPTOP (MISCELLANEOUS) ×3 IMPLANT
SYR CONTROL 10ML LL (SYRINGE) ×9 IMPLANT
TOWEL OR 17X24 6PK STRL BLUE (TOWEL DISPOSABLE) ×3 IMPLANT
TOWEL OR 17X26 10 PK STRL BLUE (TOWEL DISPOSABLE) ×3 IMPLANT
TUBE CONNECTING 12'X1/4 (SUCTIONS) ×1
TUBE CONNECTING 12X1/4 (SUCTIONS) ×2 IMPLANT
UNDERPAD 30X30 INCONTINENT (UNDERPADS AND DIAPERS) ×3 IMPLANT
WATER STERILE IRR 1000ML POUR (IV SOLUTION) IMPLANT
YANKAUER SUCT BULB TIP NO VENT (SUCTIONS) ×3 IMPLANT

## 2015-04-09 NOTE — Interval H&P Note (Signed)
History and Physical Interval Note:  04/09/2015 1:48 PM  Rebekah Silva  has presented today for surgery, with the diagnosis of ANAL FISSURE  The various methods of treatment have been discussed with the patient and family. After consideration of risks, benefits and other options for treatment, the patient has consented to  Procedure(s): ANAL CHEMICAL DENERVATION(BOTOX) (N/A) as a surgical intervention .  The patient's history has been reviewed, patient examined, no change in status, stable for surgery.  I have reviewed the patient's chart and labs.  Questions were answered to the patient's satisfaction.     Rosario Jacks., Anne Hahn

## 2015-04-09 NOTE — Op Note (Addendum)
04/09/2015  3:13 PM  PATIENT:  Rebekah Silva  45 y.o. female  PRE-OPERATIVE DIAGNOSIS:  CHRONIC ANAL FISSURE  POST-OPERATIVE DIAGNOSIS:  CHRONIC ANAL FISSURE  PROCEDURE:  Procedure(s): ANAL CHEMICAL DENERVATION(BOTOX) (N/A)  SURGEON:  Surgeon(s) and Role:    * Ralene Ok, MD - Primary  ANESTHESIA:   local and general  EBL:   5cc  BLOOD ADMINISTERED:none  DRAINS: none   LOCAL MEDICATIONS USED:  OTHER Exparil  SPECIMEN:  No Specimen  DISPOSITION OF SPECIMEN:  N/A  COUNTS:  YES  TOURNIQUET:  * No tourniquets in log *  DICTATION: .Dragon Dictation After the patient was consented she was taken back to the operating room and placed in the supine position with bilateral SCDs in place. She underwent general endotracheal anesthesia. Patient was then placed in lithotomy position. She was prepped and draped in the usual sterile fashion with Betadine. A timeout was called all facts verified.  At this time a digital rectal exam began the case. There was definite hypertension of the internal sphincter. There is also some oozing was coming from the posterior aspect of the anal canal. The internal/external sphincter were identified by palpation. The intersphincteric groove was also palpated. There is easily seen to be a large fissure in the posterior midline. There is also a sentinel tag as well as hypertrophied papilla. At this time 100 units of Botox were then injected the posterior half of the intersphincteric groove as well as the internal anal sphincter. At this time 20 mL of extra were mixed with 20 mL of saline. This was injected circumferentially around the anus.  At this time 4 x 4's, AVD pad, and mesh pads were used to dress the wound. The patient had procedure was taken to recovery in stable condition.    PLAN OF CARE: Discharge to home after PACU  PATIENT DISPOSITION:  PACU - hemodynamically stable.   Delay start of Pharmacological VTE agent (>24hrs) due to surgical  blood loss or risk of bleeding: not applicable

## 2015-04-09 NOTE — Anesthesia Preprocedure Evaluation (Addendum)
Anesthesia Evaluation  Patient identified by MRN, date of birth, ID band Patient awake    Reviewed: Allergy & Precautions, NPO status , Patient's Chart, lab work & pertinent test results  History of Anesthesia Complications (+) PONV and history of anesthetic complications  Airway Mallampati: I  TM Distance: >3 FB Neck ROM: Full    Dental  (+) Teeth Intact, Dental Advisory Given   Pulmonary neg pulmonary ROS,    Pulmonary exam normal breath sounds clear to auscultation       Cardiovascular Exercise Tolerance: Good negative cardio ROS Normal cardiovascular exam Rhythm:Regular Rate:Normal     Neuro/Psych  Headaches, negative psych ROS   GI/Hepatic Neg liver ROS, GERD  Medicated,  Endo/Other  Obesity   Renal/GU negative Renal ROS     Musculoskeletal  (+) Arthritis , Osteoarthritis,    Abdominal   Peds  Hematology negative hematology ROS (+)   Anesthesia Other Findings Day of surgery medications reviewed with the patient.  Reproductive/Obstetrics                             Anesthesia Physical Anesthesia Plan  ASA: II  Anesthesia Plan: General   Post-op Pain Management:    Induction: Intravenous  Airway Management Planned: Oral ETT  Additional Equipment:   Intra-op Plan:   Post-operative Plan: Extubation in OR  Informed Consent: I have reviewed the patients History and Physical, chart, labs and discussed the procedure including the risks, benefits and alternatives for the proposed anesthesia with the patient or authorized representative who has indicated his/her understanding and acceptance.   Dental advisory given  Plan Discussed with: CRNA  Anesthesia Plan Comments: (Risks/benefits of general anesthesia discussed with patient including risk of damage to teeth, lips, gum, and tongue, nausea/vomiting, allergic reactions to medications, and the possibility of heart attack,  stroke and death.  All patient questions answered.  Patient wishes to proceed.)        Anesthesia Quick Evaluation

## 2015-04-09 NOTE — Transfer of Care (Signed)
Immediate Anesthesia Transfer of Care Note  Patient: Rebekah Silva  Procedure(s) Performed: Procedure(s): ANAL CHEMICAL DENERVATION(BOTOX) (N/A)  Patient Location: PACU  Anesthesia Type:General  Level of Consciousness: awake, patient cooperative and lethargic  Airway & Oxygen Therapy: Patient Spontanous Breathing and Patient connected to nasal cannula oxygen  Post-op Assessment: Report given to RN, Post -op Vital signs reviewed and stable and Patient moving all extremities  Post vital signs: Reviewed and stable  Last Vitals:  Filed Vitals:   04/09/15 1147  BP: 132/84  Pulse: 94  Temp: 36.3 C  Resp: 20    Complications: No apparent anesthesia complications

## 2015-04-09 NOTE — H&P (View-Only) (Signed)
Rebekah Silva is an 45 y.o. female.   Chief Complaint: anal pain HPI: patient is a 45 year old female status post lateral internal sphincterotomy for anal fissure.  Subsequently patient has been seen in clinicwith continued pain and bleeding.  Patient has hypertension of the internal anal sphincter.  Past Medical History  Diagnosis Date  . Colitis   . Hypercholesterolemia     DR. WHITE  . Migraines   . PIH (pregnancy induced hypertension) yrs ago, none since  . GERD (gastroesophageal reflux disease)   . Arthritis     ra    Past Surgical History  Procedure Laterality Date  . Cesarean section  2007  . Mouth surgery    . Intrauterine device insertion  12/15/2005  . Sphincterotomy N/A 02/17/2015    Procedure: LATERAL INTERNAL SPHINCTEROTOMY;  Surgeon: Ralene Ok, MD;  Location: [facility];  Service: General;  Laterality: N/A;  . Rectal exam under anesthesia N/A 02/17/2015    Procedure: RECTAL EXAM UNDER ANESTHESIA;  Surgeon: Ralene Ok, MD;  Location: [facility];  Service: General;  Laterality: N/A;    Family History  Problem Relation Age of Onset  . Hypertension Father   . Diabetes Paternal Grandmother   . Heart disease Paternal Grandmother   . Cancer Maternal Grandfather     colon?   Social History:  reports that she has never smoked. She has never used smokeless tobacco. She reports that she drinks alcohol. She reports that she does not use illicit drugs.  Allergies:  Allergies  Allergen Reactions  . Aspirin     Ulcerative coalitis  . Codeine     Severe headaches  . Other     PEANUTS severe migraines  . Penicillins     rash Has patient had a PCN reaction causing immediate rash, facial/tongue/throat swelling, SOB or lightheadedness with hypotension: unknown Has patient had a PCN reaction causing severe rash involving mucus membranes or skin necrosis: unknown Has patient had a PCN reaction that required hospitalization unknown Has patient had a PCN reaction occurring  within the last 10 years: unknown If all of the above answers are "NO", then may proceed with Cephalosporin use.      (Not in a hospital admission)  No results found for this or any previous visit (from the past 48 hour(s)). No results found.  Review of Systems  Cardiovascular: Negative.   Gastrointestinal: Positive for blood in stool.  Skin: Negative.     There were no vitals taken for this visit. Physical Exam  Constitutional: She is oriented to person, place, and time. She appears well-developed and well-nourished.  HENT:  Head: Normocephalic and atraumatic.  Eyes: Conjunctivae and EOM are normal. Pupils are equal, round, and reactive to light.  Neck: Normal range of motion.  Cardiovascular: Normal rate, regular rhythm and normal heart sounds.   Respiratory: Effort normal and breath sounds normal.  GI: Soft. Bowel sounds are normal.  Genitourinary: Rectal exam shows anal tone abnormal (hypertensive).  Musculoskeletal: Normal range of motion.  Neurological: She is alert and oriented to person, place, and time.     Assessment/Plan 45 year old female with anal fissure  1.  Will proceed to the operating room for exam under anesthesia and chemical denervation of the internal anal sphincter. 2.  I discussed with the patient risks and benefits of the procedure to include but not limited to: Infection, bleed to surrounding structures, possible incontinence.  The patient was understanding and wishes to proceed.  Rosario Jacks., Ansar Skoda 04/02/2015, 4:49 PM

## 2015-04-09 NOTE — Anesthesia Procedure Notes (Signed)
Procedure Name: LMA Insertion Date/Time: 04/09/2015 2:30 PM Performed by: Willeen Cass P Pre-anesthesia Checklist: Patient identified, Timeout performed, Emergency Drugs available, Suction available and Patient being monitored Patient Re-evaluated:Patient Re-evaluated prior to inductionOxygen Delivery Method: Circle system utilized Preoxygenation: Pre-oxygenation with 100% oxygen Intubation Type: IV induction Ventilation: Mask ventilation without difficulty Number of attempts: 1 Placement Confirmation: positive ETCO2 and breath sounds checked- equal and bilateral Tube secured with: Tape Dental Injury: Teeth and Oropharynx as per pre-operative assessment

## 2015-04-09 NOTE — Anesthesia Postprocedure Evaluation (Signed)
Anesthesia Post Note  Patient: Rebekah Silva  Procedure(s) Performed: Procedure(s) (LRB): ANAL CHEMICAL DENERVATION(BOTOX) (N/A)  Patient location during evaluation: PACU Anesthesia Type: General Level of consciousness: awake and alert Pain management: pain level controlled Vital Signs Assessment: post-procedure vital signs reviewed and stable Respiratory status: spontaneous breathing, nonlabored ventilation, respiratory function stable and patient connected to nasal cannula oxygen Cardiovascular status: blood pressure returned to baseline and stable Postop Assessment: no signs of nausea or vomiting Anesthetic complications: no    Last Vitals:  Filed Vitals:   04/09/15 1600 04/09/15 1630  BP: 110/68 103/59  Pulse: 84   Temp:    Resp: 18     Last Pain:  Filed Vitals:   04/09/15 1651  PainSc: 3                  Elliot Simoneaux S

## 2015-04-12 ENCOUNTER — Encounter (HOSPITAL_COMMUNITY): Payer: Self-pay | Admitting: General Surgery

## 2015-08-24 ENCOUNTER — Other Ambulatory Visit: Payer: Self-pay | Admitting: Gynecology

## 2015-08-24 DIAGNOSIS — N83201 Unspecified ovarian cyst, right side: Secondary | ICD-10-CM

## 2015-08-25 ENCOUNTER — Ambulatory Visit (INDEPENDENT_AMBULATORY_CARE_PROVIDER_SITE_OTHER): Payer: BC Managed Care – PPO | Admitting: Gynecology

## 2015-08-25 ENCOUNTER — Ambulatory Visit (INDEPENDENT_AMBULATORY_CARE_PROVIDER_SITE_OTHER): Payer: BC Managed Care – PPO

## 2015-08-25 ENCOUNTER — Encounter: Payer: Self-pay | Admitting: Gynecology

## 2015-08-25 VITALS — BP 132/88

## 2015-08-25 DIAGNOSIS — N83201 Unspecified ovarian cyst, right side: Secondary | ICD-10-CM

## 2015-08-25 NOTE — Progress Notes (Signed)
   HPI: Patient is a 45 year old who presented to the office today for follow-up ultrasound as a result of a right ovarian cysts that was noted back in December 2016. She had had an ultrasound because her IUD string was not visualized to confirm that the IUD was in the proper position. The findings on the ultrasound that day demonstrated the following:   Ultrasound today: Uterus 9.0 x 5.4 x 4.6 cm with endometrial stripe of 5 mm. The IUD was seen in the normal position. Left ovary was normal. The right ovary had a thin wall echo-free cyst measuring 27 x 17 x 27 mm and a collapsed follicle measuring 12 x 8 mm.  Patient with no abdominal pelvic pains today. The ultrasound today demonstrated the following: Uterus measured 8.9 x 5.4 x 4.2 cm with endometrial stripe of 4.4 mm. The IUD was seen in the proper position. Right and left ovary were normal. Previous cyst not seen. No fluid in the cul-de-sac.    Assessment Plan: Complete resolution of right ovarian cyst. Patient scheduled to return back in November to change her Mirena IUD and then in December for her annual exam.    Greater than 50% of time was spent in counseling and coordinating care of this patient.   Time of consultation 10 minutes

## 2015-08-26 ENCOUNTER — Telehealth: Payer: Self-pay | Admitting: Gynecology

## 2015-08-26 NOTE — Telephone Encounter (Signed)
08/26/15-Pt was advised that she will have a $40 copay to remove existing Mirena and insert new for contraception per Carla@[facility] -[facility]#[id number] [facility]. This is a calendar year plan running from 03/14/15-03/12/16 and pt appt is in November.wl

## 2016-01-21 ENCOUNTER — Encounter: Payer: Self-pay | Admitting: Gynecology

## 2016-01-21 ENCOUNTER — Ambulatory Visit (INDEPENDENT_AMBULATORY_CARE_PROVIDER_SITE_OTHER): Payer: BC Managed Care – PPO | Admitting: Gynecology

## 2016-01-21 VITALS — BP 124/86

## 2016-01-21 DIAGNOSIS — Z23 Encounter for immunization: Secondary | ICD-10-CM

## 2016-01-21 DIAGNOSIS — Z30433 Encounter for removal and reinsertion of intrauterine contraceptive device: Secondary | ICD-10-CM

## 2016-01-21 DIAGNOSIS — Z975 Presence of (intrauterine) contraceptive device: Secondary | ICD-10-CM | POA: Insufficient documentation

## 2016-01-21 NOTE — Addendum Note (Signed)
Addended by: Thurnell Garbe A on: 01/21/2016 10:55 AM   Modules accepted: Orders

## 2016-01-21 NOTE — Patient Instructions (Signed)

## 2016-01-21 NOTE — Progress Notes (Signed)
   Patient is a 45 year old who presented to the office today to replace her expired Mirena IUD which was placed back in 2012. This is patient's third Mirena IUD and she has had good experience.                                                                    IUD procedure note       Patient presented to the office today for placement of Mirena IUD. The patient had previously been provided with literature information on this method of contraception. The risks benefits and pros and cons were discussed and all her questions were answered. She is fully aware that this form of contraception is 99% effective and is good for 5 years.  Pelvic exam: Bartholin urethra Skene glands: Within normal limits Vagina: No lesions or discharge Cervix: No lesions or discharge Uterus: Anteverted position Adnexa: No masses or tenderness Rectal exam: Not done  The cervix was cleansed with Betadine solution. A single-tooth tenaculum was placed on the anterior cervical lip. With the use of a [place] clamp the expired IUD string was grasped the IUD was retrieved shown to the patient discarded. The uterus sounded to 7 centimeter. The IUD was shown to the patient and inserted in a sterile fashion. The IUD string was trimmed. The single-tooth tenaculum was removed. Patient was instructed to return back to the office in one month for follow up.

## 2016-01-24 ENCOUNTER — Encounter: Payer: BC Managed Care – PPO | Admitting: Gynecology

## 2016-01-24 ENCOUNTER — Encounter: Payer: Self-pay | Admitting: Anesthesiology

## 2016-01-25 NOTE — Progress Notes (Signed)
This encounter was created in error - please disregard.

## 2016-02-15 ENCOUNTER — Encounter: Payer: Self-pay | Admitting: Gynecology

## 2016-02-15 ENCOUNTER — Ambulatory Visit (INDEPENDENT_AMBULATORY_CARE_PROVIDER_SITE_OTHER): Payer: BC Managed Care – PPO | Admitting: Gynecology

## 2016-02-15 VITALS — BP 126/82 | Ht 66.5 in | Wt 264.0 lb

## 2016-02-15 DIAGNOSIS — Z01419 Encounter for gynecological examination (general) (routine) without abnormal findings: Secondary | ICD-10-CM

## 2016-02-15 NOTE — Progress Notes (Signed)
Rebekah Silva 1970/06/01 [id number]   History:    45 y.o.  for annual gyn exam with no complaints today. Review of her record indicated in November she had a Mirena IUD removed followed by insertion of a new one. She has otherwise done well. 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 Her PCP also is following her for hypercholesterolemia and has been doing her blood work.As a result of patient's long-term steroid use in the past she had a bone density study in 2014 which was normal. Patient's vaccines are up-to-date. Patient with no previous history of any abnormal Pap smears   Past medical history,surgical history, family history and social history were all reviewed and documented in the EPIC chart.  Gynecologic History No LMP recorded. Patient is not currently having periods (Reason: IUD). Contraception: IUD Last Pap: 2012 and 2015. Results were: normal Last mammogram: 2016. Results were: normal  Obstetric History OB History  Gravida Para Term Preterm AB Living  1 1 1     1   SAB TAB Ectopic Multiple Live Births          1    # Outcome Date GA Lbr Len/2nd Weight Sex Delivery Anes PTL Lv  1 Term     M CS-Unspec   LIV       ROS: A ROS was performed and pertinent positives and negatives are included in the history.  GENERAL: No fevers or chills. HEENT: No change in vision, no earache, sore throat or sinus congestion. NECK: No pain or stiffness. CARDIOVASCULAR: No chest pain or pressure. No palpitations. PULMONARY: No shortness of breath, cough or wheeze. GASTROINTESTINAL: No abdominal pain, nausea, vomiting or diarrhea, melena or bright red blood per rectum. GENITOURINARY: No urinary frequency, urgency, hesitancy or dysuria. MUSCULOSKELETAL: No joint or muscle pain, no back pain, no recent trauma. DERMATOLOGIC: No rash, no itching, no lesions. ENDOCRINE: No polyuria,  polydipsia, no heat or cold intolerance. No recent change in weight. HEMATOLOGICAL: No anemia or easy bruising or bleeding. NEUROLOGIC: No headache, seizures, numbness, tingling or weakness. PSYCHIATRIC: No depression, no loss of interest in normal activity or change in sleep pattern.     Exam: chaperone present  BP 126/82   Ht 5' 6.5" (1.689 m)   Wt 264 lb (119.7 kg)   BMI 41.97 kg/m   Body mass index is 41.97 kg/m.  General appearance : Well developed well nourished female. No acute distress HEENT: Eyes: no retinal hemorrhage or exudates,  Neck supple, trachea midline, no carotid bruits, no thyroidmegaly Lungs: Clear to auscultation, no rhonchi or wheezes, or rib retractions  Heart: Regular rate and rhythm, no murmurs or gallops Breast:Examined in sitting and supine position were symmetrical in appearance, no palpable masses or tenderness,  no skin retraction, no nipple inversion, no nipple discharge, no skin discoloration, no axillary or supraclavicular lymphadenopathy Abdomen: no palpable masses or tenderness, no rebound or guarding Extremities: no edema or skin discoloration or tenderness  Pelvic:  Bartholin, Urethra, Skene Glands: Within normal limits             Vagina: No gross lesions or discharge  Cervix: No gross lesions or discharge, IUD string visualized  Uterus  anteverted, normal size, shape and consistency, non-tender and mobile  Adnexa  Without masses or tenderness  Anus and perineum  normal   Rectovaginal  normal sphincter tone without palpated masses or  tenderness             Hemoccult not indicated     Assessment/Plan:  45 y.o. female for annual exam doing well from the gynecological standpoint. 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 She scheduled for colonoscopy in January 2018. Pap smear not indicated this year. She was reminded to  schedule her mammogram this month which is overdue.   Terrance Mass MD, 5:11 PM 02/15/2016

## 2016-04-10 ENCOUNTER — Other Ambulatory Visit: Payer: Self-pay | Admitting: Gynecology

## 2016-04-10 ENCOUNTER — Encounter: Payer: Self-pay | Admitting: Gynecology

## 2016-04-10 DIAGNOSIS — Z1231 Encounter for screening mammogram for malignant neoplasm of breast: Secondary | ICD-10-CM

## 2016-04-12 ENCOUNTER — Encounter: Payer: Self-pay | Admitting: Anesthesiology

## 2016-04-21 ENCOUNTER — Ambulatory Visit
Admission: RE | Admit: 2016-04-21 | Discharge: 2016-04-21 | Disposition: A | Discharge status: Home / Self Care | Payer: BC Managed Care – PPO | Source: Ambulatory Visit | Attending: Gynecology | Admitting: Gynecology

## 2016-04-21 DIAGNOSIS — Z1231 Encounter for screening mammogram for malignant neoplasm of breast: Secondary | ICD-10-CM

## 2016-07-06 HISTORY — PX: ANAL FISSURE REPAIR: SHX2312

## 2016-07-26 ENCOUNTER — Encounter: Payer: Self-pay | Admitting: Gynecology

## 2017-03-07 ENCOUNTER — Encounter: Payer: Self-pay | Admitting: Obstetrics & Gynecology

## 2017-03-07 ENCOUNTER — Ambulatory Visit: Payer: BC Managed Care – PPO | Admitting: Obstetrics & Gynecology

## 2017-03-07 VITALS — BP 122/74 | Ht 66.5 in | Wt 238.0 lb

## 2017-03-07 DIAGNOSIS — Z1151 Encounter for screening for human papillomavirus (HPV): Secondary | ICD-10-CM

## 2017-03-07 DIAGNOSIS — Z01419 Encounter for gynecological examination (general) (routine) without abnormal findings: Secondary | ICD-10-CM

## 2017-03-07 DIAGNOSIS — Z30431 Encounter for routine checking of intrauterine contraceptive device: Secondary | ICD-10-CM | POA: Diagnosis not present

## 2017-03-07 NOTE — Progress Notes (Signed)
Rebekah Silva May 18, 1970 [id number]   History:    46 y.o. G1P1L1 Married.  Son is 58 yo.  RP:  Established patient presenting for annual gyn exam   HPI: New Mirena IUD inserted in November 2017.  Patient is doing very well on it with light periods every 2-3 months.  History of PCOS.  No abdominal pelvic pain.  Normal vaginal secretions.  No pain with intercourse.  History of ulcerative colitis under control with medications.  Not currently on prednisone.  Last bone density normal in 2014.  Had 3 surgeries for rectal fissures.  Last one in April 2018.  Patient feels that the problem has resolved with the last surgery.  Bowel movements currently normal.  Breasts normal.  Urine normal.  Patient has a migraine this morning, improving now with medication.  History of migraines followed by family physician.  Health labs with family physician.  Past medical history,surgical history, family history and social history were all reviewed and documented in the EPIC chart.  Gynecologic History No LMP recorded. Patient is not currently having periods (Reason: IUD). Contraception: IUD Last Pap: 2015. Results were: normal Last mammogram: 04/2016.  Results were: normal Bone density normal 2014  Obstetric History OB History  Gravida Para Term Preterm AB Living  1 1 1     1   SAB TAB Ectopic Multiple Live Births          1    # Outcome Date GA Lbr Len/2nd Weight Sex Delivery Anes PTL Lv  1 Term     M CS-Unspec   LIV       ROS: A ROS was performed and pertinent positives and negatives are included in the history.  GENERAL: No fevers or chills. HEENT: No change in vision, no earache, sore throat or sinus congestion. NECK: No pain or stiffness. CARDIOVASCULAR: No chest pain or pressure. No palpitations. PULMONARY: No shortness of breath, cough or wheeze. GASTROINTESTINAL: No abdominal pain, nausea, vomiting or diarrhea, melena or bright red blood per rectum. GENITOURINARY: No urinary frequency,  urgency, hesitancy or dysuria. MUSCULOSKELETAL: No joint or muscle pain, no back pain, no recent trauma. DERMATOLOGIC: No rash, no itching, no lesions. ENDOCRINE: No polyuria, polydipsia, no heat or cold intolerance. No recent change in weight. HEMATOLOGICAL: No anemia or easy bruising or bleeding. NEUROLOGIC: No headache, seizures, numbness, tingling or weakness. PSYCHIATRIC: No depression, no loss of interest in normal activity or change in sleep pattern.     Exam:   BP 122/74   Ht 5' 6.5" (1.689 m)   Wt 238 lb (108 kg)   BMI 37.84 kg/m   Body mass index is 37.84 kg/m.  General appearance : Well developed well nourished female. No acute distress HEENT: Eyes: no retinal hemorrhage or exudates,  Neck supple, trachea midline, no carotid bruits, no thyroidmegaly Lungs: Clear to auscultation, no rhonchi or wheezes, or rib retractions  Heart: Regular rate and rhythm, no murmurs or gallops Breast:Examined in sitting and supine position were symmetrical in appearance, no palpable masses or tenderness,  no skin retraction, no nipple inversion, no nipple discharge, no skin discoloration, no axillary or supraclavicular lymphadenopathy Abdomen: no palpable masses or tenderness, no rebound or guarding Extremities: no edema or skin discoloration or tenderness  Pelvic: Vulva normal  Bartholin, Urethra, Skene Glands: Within normal limits             Vagina: No gross lesions or discharge  Cervix: No gross lesions or discharge.  IUD strings visible. Pap/HR  HPV done.  Uterus  AV, normal size, shape and consistency, non-tender and mobile  Adnexa  Without masses or tenderness  Anus and perineum  normal    Assessment/Plan:  46 y.o. female for annual exam   1. Encounter for routine gynecological examination with Papanicolaou smear of cervix Normal gynecologic exam.  Pap test with high risk HPV done.  Breast exam normal.  Will do health labs with family physician.  2. Encounter for routine checking of  intrauterine contraceptive device (IUD) Mirena IUD since November 2017, in good position and well tolerated with no sign of infection.  Princess Bruins MD, 10:37 AM 03/07/2017

## 2017-03-07 NOTE — Addendum Note (Signed)
Addended by: Thurnell Garbe A on: 03/07/2017 11:58 AM   Modules accepted: Orders

## 2017-03-07 NOTE — Patient Instructions (Signed)
1. Encounter for routine gynecological examination with Papanicolaou smear of cervix Normal gynecologic exam.  Pap test with high risk HPV done.  Breast exam normal.  Will do health labs with family physician.  2. Encounter for routine checking of intrauterine contraceptive device (IUD) Mirena IUD since November 2017, in good position and well tolerated with no sign of infection.  Rebekah Silva, it was a pleasure meeting you today!  I will inform you of your results as soon as they are available.  Health Maintenance, Female Adopting a healthy lifestyle and getting preventive care can go a long way to promote health and wellness. Talk with your health care provider about what schedule of regular examinations is right for you. This is a good chance for you to check in with your provider about disease prevention and staying healthy. In between checkups, there are plenty of things you can do on your own. Experts have done a lot of research about which lifestyle changes and preventive measures are most likely to keep you healthy. Ask your health care provider for more information. Weight and diet Eat a healthy diet  Be sure to include plenty of vegetables, fruits, low-fat dairy products, and lean protein.  Do not eat a lot of foods high in solid fats, added sugars, or salt.  Get regular exercise. This is one of the most important things you can do for your health. ? Most adults should exercise for at least 150 minutes each week. The exercise should increase your heart rate and make you sweat (moderate-intensity exercise). ? Most adults should also do strengthening exercises at least twice a week. This is in addition to the moderate-intensity exercise.  Maintain a healthy weight  Body mass index (BMI) is a measurement that can be used to identify possible weight problems. It estimates body fat based on height and weight. Your health care provider can help determine your BMI and help you achieve or maintain  a healthy weight.  For females 65 years of age and older: ? A BMI below 18.5 is considered underweight. ? A BMI of 18.5 to 24.9 is normal. ? A BMI of 25 to 29.9 is considered overweight. ? A BMI of 30 and above is considered obese.  Watch levels of cholesterol and blood lipids  You should start having your blood tested for lipids and cholesterol at 44 years of age, then have this test every 5 years.  You may need to have your cholesterol levels checked more often if: ? Your lipid or cholesterol levels are high. ? You are older than 44 years of age. ? You are at high risk for heart disease.  Cancer screening Lung Cancer  Lung cancer screening is recommended for adults 47-74 years old who are at high risk for lung cancer because of a history of smoking.  A yearly low-dose CT scan of the lungs is recommended for people who: ? Currently smoke. ? Have quit within the past 15 years. ? Have at least a 30-pack-year history of smoking. A pack year is smoking an average of one pack of cigarettes a day for 1 year.  Yearly screening should continue until it has been 15 years since you quit.  Yearly screening should stop if you develop a health problem that would prevent you from having lung cancer treatment.  Breast Cancer  Practice breast self-awareness. This means understanding how your breasts normally appear and feel.  It also means doing regular breast self-exams. Let your health care provider know about  any changes, no matter how small.  If you are in your 20s or 30s, you should have a clinical breast exam (CBE) by a health care provider every 1-3 years as part of a regular health exam.  If you are 58 or older, have a CBE every year. Also consider having a breast X-ray (mammogram) every year.  If you have a family history of breast cancer, talk to your health care provider about genetic screening.  If you are at high risk for breast cancer, talk to your health care provider about  having an MRI and a mammogram every year.  Breast cancer gene (BRCA) assessment is recommended for women who have family members with BRCA-related cancers. BRCA-related cancers include: ? Breast. ? Ovarian. ? Tubal. ? Peritoneal cancers.  Results of the assessment will determine the need for genetic counseling and BRCA1 and BRCA2 testing.  Cervical Cancer Your health care provider may recommend that you be screened regularly for cancer of the pelvic organs (ovaries, uterus, and vagina). This screening involves a pelvic examination, including checking for microscopic changes to the surface of your cervix (Pap test). You may be encouraged to have this screening done every 3 years, beginning at age 39.  For women ages 13-65, health care providers may recommend pelvic exams and Pap testing every 3 years, or they may recommend the Pap and pelvic exam, combined with testing for human papilloma virus (HPV), every 5 years. Some types of HPV increase your risk of cervical cancer. Testing for HPV may also be done on women of any age with unclear Pap test results.  Other health care providers may not recommend any screening for nonpregnant women who are considered low risk for pelvic cancer and who do not have symptoms. Ask your health care provider if a screening pelvic exam is right for you.  If you have had past treatment for cervical cancer or a condition that could lead to cancer, you need Pap tests and screening for cancer for at least 20 years after your treatment. If Pap tests have been discontinued, your risk factors (such as having a new sexual partner) need to be reassessed to determine if screening should resume. Some women have medical problems that increase the chance of getting cervical cancer. In these cases, your health care provider may recommend more frequent screening and Pap tests.  Colorectal Cancer  This type of cancer can be detected and often prevented.  Routine colorectal cancer  screening usually begins at 44 years of age and continues through 44 years of age.  Your health care provider may recommend screening at an earlier age if you have risk factors for colon cancer.  Your health care provider may also recommend using home test kits to check for hidden blood in the stool.  A small camera at the end of a tube can be used to examine your colon directly (sigmoidoscopy or colonoscopy). This is done to check for the earliest forms of colorectal cancer.  Routine screening usually begins at age 47.  Direct examination of the colon should be repeated every 5-10 years through 44 years of age. However, you may need to be screened more often if early forms of precancerous polyps or small growths are found.  Skin Cancer  Check your skin from head to toe regularly.  Tell your health care provider about any new moles or changes in moles, especially if there is a change in a mole's shape or color.  Also tell your health care provider  you have a mole that is larger than the size of a pencil eraser.  Always use sunscreen. Apply sunscreen liberally and repeatedly throughout the day.  Protect yourself by wearing long sleeves, pants, a wide-brimmed hat, and sunglasses whenever you are outside.  Heart disease, diabetes, and high blood pressure  High blood pressure causes heart disease and increases the risk of stroke. High blood pressure is more likely to develop in: ? People who have blood pressure in the high end of the normal range (130-139/85-89 mm Hg). ? People who are overweight or obese. ? People who are African American.  If you are 18-39 years of age, have your blood pressure checked every 3-5 years. If you are 40 years of age or older, have your blood pressure checked every year. You should have your blood pressure measured twice-once when you are at a hospital or clinic, and once when you are not at a hospital or clinic. Record the average of the two measurements.  To check your blood pressure when you are not at a hospital or clinic, you can use: ? An automated blood pressure machine at a pharmacy. ? A home blood pressure monitor.  If you are between 55 years and 79 years old, ask your health care provider if you should take aspirin to prevent strokes.  Have regular diabetes screenings. This involves taking a blood sample to check your fasting blood sugar level. ? If you are at a normal weight and have a low risk for diabetes, have this test once every three years after 45 years of age. ? If you are overweight and have a high risk for diabetes, consider being tested at a younger age or more often. Preventing infection Hepatitis B  If you have a higher risk for hepatitis B, you should be screened for this virus. You are considered at high risk for hepatitis B if: ? You were born in a country where hepatitis B is common. Ask your health care provider which countries are considered high risk. ? Your parents were born in a high-risk country, and you have not been immunized against hepatitis B (hepatitis B vaccine). ? You have HIV or AIDS. ? You use needles to inject street drugs. ? You live with someone who has hepatitis B. ? You have had sex with someone who has hepatitis B. ? You get hemodialysis treatment. ? You take certain medicines for conditions, including cancer, organ transplantation, and autoimmune conditions.  Hepatitis C  Blood testing is recommended for: ? Everyone born from 1945 through 1965. ? Anyone with known risk factors for hepatitis C.  Sexually transmitted infections (STIs)  You should be screened for sexually transmitted infections (STIs) including gonorrhea and chlamydia if: ? You are sexually active and are younger than 44 years of age. ? You are older than 44 years of age and your health care provider tells you that you are at risk for this type of infection. ? Your sexual activity has changed since you were last screened  and you are at an increased risk for chlamydia or gonorrhea. Ask your health care provider if you are at risk.  If you do not have HIV, but are at risk, it may be recommended that you take a prescription medicine daily to prevent HIV infection. This is called pre-exposure prophylaxis (PrEP). You are considered at risk if: ? You are sexually active and do not regularly use condoms or know the HIV status of your partner(s). ? You take drugs by   by injection. ? You are sexually active with a partner who has HIV.  Talk with your health care provider about whether you are at high risk of being infected with HIV. If you choose to begin PrEP, you should first be tested for HIV. You should then be tested every 3 months for as long as you are taking PrEP. Pregnancy  If you are premenopausal and you may become pregnant, ask your health care provider about preconception counseling.  If you may become pregnant, take 400 to 800 micrograms (mcg) of folic acid every day.  If you want to prevent pregnancy, talk to your health care provider about birth control (contraception). Osteoporosis and menopause  Osteoporosis is a disease in which the bones lose minerals and strength with aging. This can result in serious bone fractures. Your risk for osteoporosis can be identified using a bone density scan.  If you are 29 years of age or older, or if you are at risk for osteoporosis and fractures, ask your health care provider if you should be screened.  Ask your health care provider whether you should take a calcium or vitamin D supplement to lower your risk for osteoporosis.  Menopause may have certain physical symptoms and risks.  Hormone replacement therapy may reduce some of these symptoms and risks. Talk to your health care provider about whether hormone replacement therapy is right for you. Follow these instructions at home:  Schedule regular health, dental, and eye exams.  Stay current with your  immunizations.  Do not use any tobacco products including cigarettes, chewing tobacco, or electronic cigarettes.  If you are pregnant, do not drink alcohol.  If you are breastfeeding, limit how much and how often you drink alcohol.  Limit alcohol intake to no more than 1 drink per day for nonpregnant women. One drink equals 12 ounces of beer, 5 ounces of wine, or 1 ounces of hard liquor.  Do not use street drugs.  Do not share needles.  Ask your health care provider for help if you need support or information about quitting drugs.  Tell your health care provider if you often feel depressed.  Tell your health care provider if you have ever been abused or do not feel safe at home. This information is not intended to replace advice given to you by your health care provider. Make sure you discuss any questions you have with your health care provider. Document Released: 09/12/2010 Document Revised: 08/05/2015 Document Reviewed: 12/01/2014 Elsevier Interactive Patient Education  [employer].

## 2017-03-09 LAB — PAP, TP IMAGING W/ HPV RNA, RFLX HPV TYPE 16,18/45: HPV DNA HIGH RISK: NOT DETECTED

## 2017-03-12 ENCOUNTER — Encounter: Payer: Self-pay | Admitting: *Deleted

## 2017-03-13 HISTORY — PX: RETINAL DETACHMENT SURGERY: SHX105

## 2017-05-14 ENCOUNTER — Other Ambulatory Visit: Payer: Self-pay | Admitting: Obstetrics & Gynecology

## 2017-05-14 DIAGNOSIS — Z1231 Encounter for screening mammogram for malignant neoplasm of breast: Secondary | ICD-10-CM

## 2017-06-04 ENCOUNTER — Ambulatory Visit: Payer: Self-pay

## 2017-06-18 ENCOUNTER — Ambulatory Visit
Admission: RE | Admit: 2017-06-18 | Discharge: 2017-06-18 | Disposition: A | Discharge status: Home / Self Care | Payer: BC Managed Care – PPO | Source: Ambulatory Visit | Attending: Obstetrics & Gynecology | Admitting: Obstetrics & Gynecology

## 2017-06-18 DIAGNOSIS — Z1231 Encounter for screening mammogram for malignant neoplasm of breast: Secondary | ICD-10-CM

## 2018-03-08 ENCOUNTER — Encounter: Payer: Self-pay | Admitting: Obstetrics & Gynecology

## 2018-03-08 ENCOUNTER — Ambulatory Visit: Payer: BC Managed Care – PPO | Admitting: Obstetrics & Gynecology

## 2018-03-08 VITALS — BP 114/72 | Ht 66.5 in | Wt 268.4 lb

## 2018-03-08 DIAGNOSIS — Z01419 Encounter for gynecological examination (general) (routine) without abnormal findings: Secondary | ICD-10-CM | POA: Diagnosis not present

## 2018-03-08 DIAGNOSIS — Z30431 Encounter for routine checking of intrauterine contraceptive device: Secondary | ICD-10-CM

## 2018-03-08 DIAGNOSIS — K5190 Ulcerative colitis, unspecified, without complications: Secondary | ICD-10-CM

## 2018-03-08 DIAGNOSIS — E661 Drug-induced obesity: Secondary | ICD-10-CM | POA: Diagnosis not present

## 2018-03-08 DIAGNOSIS — Z6841 Body Mass Index (BMI) 40.0 and over, adult: Secondary | ICD-10-CM

## 2018-03-08 NOTE — Progress Notes (Signed)
Rebekah Silva [birth year] [id number]   History:    47 y.o. G1P1L1 Married.  Son is 89 yo.  RP:  Established patient presenting for annual gyn exam   HPI: Well on Mirena IUD x 01/2016.  No breakthrough bleeding.  No pelvic pain.  No pain with intercourse.  Urine normal.  Ulcerative colitis flared up recently.  Patient is now on Entyvio injections.  Ulcerative colitis symptoms improving on that treatment.  Breast normal.  Body mass index 42.67.  Exercising regularly, mainly walking.  Health labs with family physician.  Past medical history,surgical history, family history and social history were all reviewed and documented in the EPIC chart.  Gynecologic History No LMP recorded. (Menstrual status: IUD). Contraception: Mirena IUD x 01/2016 Last Pap: 02/2017. Results were: Negative/HPV HR neg Last mammogram: 06/2017. Results were: Negative Bone Density: 09/2012 Normal Colonoscopy: Every 2 yrs.  Ulcerative Colitis.  Obstetric History OB History  Gravida Para Term Preterm AB Living  1 1 1     1   SAB TAB Ectopic Multiple Live Births          1    # Outcome Date GA Lbr Len/2nd Weight Sex Delivery Anes PTL Lv  1 Term     M CS-Unspec   LIV     ROS: A ROS was performed and pertinent positives and negatives are included in the history.  GENERAL: No fevers or chills. HEENT: No change in vision, no earache, sore throat or sinus congestion. NECK: No pain or stiffness. CARDIOVASCULAR: No chest pain or pressure. No palpitations. PULMONARY: No shortness of breath, cough or wheeze. GASTROINTESTINAL: No abdominal pain, nausea, vomiting or diarrhea, melena or bright red blood per rectum. GENITOURINARY: No urinary frequency, urgency, hesitancy or dysuria. MUSCULOSKELETAL: No joint or muscle pain, no back pain, no recent trauma. DERMATOLOGIC: No rash, no itching, no lesions. ENDOCRINE: No polyuria, polydipsia, no heat or cold intolerance. No recent change in weight. HEMATOLOGICAL: No anemia or easy  bruising or bleeding. NEUROLOGIC: No headache, seizures, numbness, tingling or weakness. PSYCHIATRIC: No depression, no loss of interest in normal activity or change in sleep pattern.     Exam:   BP 114/72   Ht 5' 6.5" (1.689 m)   Wt 268 lb 6.4 oz (121.7 kg)   BMI 42.67 kg/m   Body mass index is 42.67 kg/m.  General appearance : Well developed well nourished female. No acute distress HEENT: Eyes: no retinal hemorrhage or exudates,  Neck supple, trachea midline, no carotid bruits, no thyroidmegaly Lungs: Clear to auscultation, no rhonchi or wheezes, or rib retractions  Heart: Regular rate and rhythm, no murmurs or gallops Breast:Examined in sitting and supine position were symmetrical in appearance, no palpable masses or tenderness,  no skin retraction, no nipple inversion, no nipple discharge, no skin discoloration, no axillary or supraclavicular lymphadenopathy Abdomen: no palpable masses or tenderness, no rebound or guarding Extremities: no edema or skin discoloration or tenderness  Pelvic: Vulva: Normal             Vagina: No gross lesions or discharge  Cervix: No gross lesions or discharge.  IUD strings visible  Uterus  AV, normal size, shape and consistency, non-tender and mobile  Adnexa  Without masses or tenderness  Anus: Small non-thrombosed external hemorrhoids   Assessment/Plan:  47 y.o. female for annual exam   1. Well female exam with routine gynecological exam Normal gynecologic exam.  Pap test negative/HPV HR neg 02/2017.  No indication to repeat a Pap  test this year.  Breasts normal.  Screening mammo negative 06/2017.  Health labs with Family MD.  2. Encounter for routine checking of intrauterine contraceptive device (IUD) Mirena IUD in good position, well tolerated x 01/2016.  3. 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  Aerobic  activities 5 times a week and weight lifting every 2 days recommended.  4. Ulcerative colitis without complications, unspecified location ([place]) Under treatment with GE on Entyvio injections.  Other orders - vedolizumab (ENTYVIO) 300 MG injection; Inject into the vein. - promethazine (PHENERGAN) 50 MG suppository; Place 50 mg rectally every 6 (six) hours as needed for nausea or vomiting.  Princess Bruins MD, 10:50 AM 03/08/2018

## 2018-03-12 ENCOUNTER — Encounter: Payer: Self-pay | Admitting: Obstetrics & Gynecology

## 2018-03-12 NOTE — Patient Instructions (Signed)
1. Well female exam with routine gynecological exam Normal gynecologic exam.  Pap test negative/HPV HR neg 02/2017.  No indication to repeat a Pap test this year.  Breasts normal.  Screening mammo negative 06/2017.  Health labs with Family MD.  2. Encounter for routine checking of intrauterine contraceptive device (IUD) Mirena IUD in good position, well tolerated x 01/2016.  3. 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  Aerobic activities 5 times a week and weight lifting every 2 days recommended.  4. Ulcerative colitis without complications, unspecified location ([place]) Under treatment with GE on Entyvio injections.  Other orders - vedolizumab (ENTYVIO) 300 MG injection; Inject into the vein. - promethazine (PHENERGAN) 50 MG suppository; Place 50 mg rectally every 6 (six) hours as needed for nausea or vomiting.  Rebekah Silva, it was a pleasure seeing you today!

## 2018-05-14 ENCOUNTER — Other Ambulatory Visit: Payer: Self-pay | Admitting: Obstetrics & Gynecology

## 2018-05-14 DIAGNOSIS — Z1231 Encounter for screening mammogram for malignant neoplasm of breast: Secondary | ICD-10-CM

## 2018-06-20 ENCOUNTER — Ambulatory Visit: Payer: Self-pay

## 2018-08-06 ENCOUNTER — Ambulatory Visit: Payer: Self-pay

## 2018-08-14 ENCOUNTER — Other Ambulatory Visit: Payer: Self-pay

## 2018-08-14 ENCOUNTER — Ambulatory Visit
Admission: RE | Admit: 2018-08-14 | Discharge: 2018-08-14 | Disposition: A | Discharge status: Home / Self Care | Payer: BC Managed Care – PPO | Source: Ambulatory Visit | Attending: Obstetrics & Gynecology | Admitting: Obstetrics & Gynecology

## 2018-08-14 DIAGNOSIS — Z1231 Encounter for screening mammogram for malignant neoplasm of breast: Secondary | ICD-10-CM

## 2019-03-10 ENCOUNTER — Encounter: Payer: BC Managed Care – PPO | Admitting: Obstetrics & Gynecology

## 2019-03-11 ENCOUNTER — Other Ambulatory Visit: Payer: Self-pay

## 2019-03-12 ENCOUNTER — Ambulatory Visit (INDEPENDENT_AMBULATORY_CARE_PROVIDER_SITE_OTHER): Payer: BC Managed Care – PPO | Admitting: Obstetrics & Gynecology

## 2019-03-12 ENCOUNTER — Encounter: Payer: Self-pay | Admitting: Obstetrics & Gynecology

## 2019-03-12 VITALS — BP 128/80 | Ht 66.0 in | Wt 287.0 lb

## 2019-03-12 DIAGNOSIS — Z30431 Encounter for routine checking of intrauterine contraceptive device: Secondary | ICD-10-CM | POA: Diagnosis not present

## 2019-03-12 DIAGNOSIS — Z01419 Encounter for gynecological examination (general) (routine) without abnormal findings: Secondary | ICD-10-CM

## 2019-03-12 DIAGNOSIS — Z6841 Body Mass Index (BMI) 40.0 and over, adult: Secondary | ICD-10-CM

## 2019-03-12 NOTE — Progress Notes (Signed)
Rebekah Silva [birth year] [id number]   History:    48 y.o. G1P1L1 Married.  Son is 72 yo (gender identity uncertainty)  RP:  Established patient presenting for annual gyn exam   HPI: Well on Mirena IUD x 01/2016.  No breakthrough bleeding.  No pelvic pain.  No pain with intercourse.  Urine normal.  Ulcerative colitis flared up recently.  Patient is on Entyvio injections.  Ulcerative colitis symptoms improving on that treatment.  Breast normal.  Body mass index 46.32.  Needs to increase physical activity.  Health labs with family physician.  Past medical history,surgical history, family history and social history were all reviewed and documented in the EPIC chart.  Gynecologic History No LMP recorded (lmp unknown). (Menstrual status: IUD).  Obstetric History OB History  Gravida Para Term Preterm AB Living  1 1 1     1   SAB TAB Ectopic Multiple Live Births          1    # Outcome Date GA Lbr Len/2nd Weight Sex Delivery Anes PTL Lv  1 Term     M CS-Unspec   LIV     ROS: A ROS was performed and pertinent positives and negatives are included in the history.  GENERAL: No fevers or chills. HEENT: No change in vision, no earache, sore throat or sinus congestion. NECK: No pain or stiffness. CARDIOVASCULAR: No chest pain or pressure. No palpitations. PULMONARY: No shortness of breath, cough or wheeze. GASTROINTESTINAL: No abdominal pain, nausea, vomiting or diarrhea, melena or bright red blood per rectum. GENITOURINARY: No urinary frequency, urgency, hesitancy or dysuria. MUSCULOSKELETAL: No joint or muscle pain, no back pain, no recent trauma. DERMATOLOGIC: No rash, no itching, no lesions. ENDOCRINE: No polyuria, polydipsia, no heat or cold intolerance. No recent change in weight. HEMATOLOGICAL: No anemia or easy bruising or bleeding. NEUROLOGIC: No headache, seizures, numbness, tingling or weakness. PSYCHIATRIC: No depression, no loss of interest in normal activity or change in sleep  pattern.     Exam:   BP 128/80 (BP Location: Right Arm, Patient Position: Sitting, Cuff Size: Normal)   Ht 5' 6"  (1.676 m)   Wt 287 lb (130.2 kg)   LMP  (LMP Unknown) Comment: spotting  BMI 46.32 kg/m   Body mass index is 46.32 kg/m.  General appearance : Well developed well nourished female. No acute distress HEENT: Eyes: no retinal hemorrhage or exudates,  Neck supple, trachea midline, no carotid bruits, no thyroidmegaly Lungs: Clear to auscultation, no rhonchi or wheezes, or rib retractions  Heart: Regular rate and rhythm, no murmurs or gallops Breast:Examined in sitting and supine position were symmetrical in appearance, no palpable masses or tenderness,  no skin retraction, no nipple inversion, no nipple discharge, no skin discoloration, no axillary or supraclavicular lymphadenopathy Abdomen: no palpable masses or tenderness, no rebound or guarding Extremities: no edema or skin discoloration or tenderness  Pelvic: Vulva: Normal             Vagina: No gross lesions or discharge  Cervix: No gross lesions or discharge.  IUD strings visible at the external os.  Uterus  AV, normal size, shape and consistency, non-tender and mobile  Adnexa  Without masses or tenderness  Anus: Normal   Assessment/Plan:  48 y.o. female for annual exam   1. Well female exam with routine gynecological exam Normal gynecologic exam.  Pap test December 2018 was negative, no indication to repeat this year, will repeat at 3 years.  Breast exam normal.  Screening mammogram June 2020 was negative.  Health labs with family physician.  2. Encounter for routine checking of intrauterine contraceptive device (IUD) Well with Mirena IUD since November 2017.  Well-tolerated and in good position.  3. Class 3 severe obesity due to excess calories with serious comorbidity and body mass index (BMI) of 45.0 to 49.9 in adult [facility]) Low calorie/carb diet such as [employer] recommended.  Aerobic activities 5 times  a week and weight lifting every 2 days.  Princess Bruins MD, 2:15 PM 03/12/2019

## 2019-03-17 ENCOUNTER — Encounter: Payer: Self-pay | Admitting: Obstetrics & Gynecology

## 2019-03-17 NOTE — Patient Instructions (Signed)
1. Well female exam with routine gynecological exam Normal gynecologic exam.  Pap test December 2018 was negative, no indication to repeat this year, will repeat at 3 years.  Breast exam normal.  Screening mammogram June 2020 was negative.  Health labs with family physician.  2. Encounter for routine checking of intrauterine contraceptive device (IUD) Well with Mirena IUD since November 2017.  Well-tolerated and in good position.  3. Class 3 severe obesity due to excess calories with serious comorbidity and body mass index (BMI) of 45.0 to 49.9 in adult [facility]) Low calorie/carb diet such as [employer] recommended.  Aerobic activities 5 times a week and weight lifting every 2 days.  Rebekah Silva, it was a pleasure seeing you today!

## 2019-04-25 ENCOUNTER — Ambulatory Visit: Payer: BC Managed Care – PPO

## 2019-09-29 ENCOUNTER — Other Ambulatory Visit: Payer: Self-pay | Admitting: Obstetrics & Gynecology

## 2019-09-29 DIAGNOSIS — Z1231 Encounter for screening mammogram for malignant neoplasm of breast: Secondary | ICD-10-CM

## 2019-09-30 ENCOUNTER — Other Ambulatory Visit: Payer: Self-pay

## 2019-09-30 ENCOUNTER — Ambulatory Visit
Admission: RE | Admit: 2019-09-30 | Discharge: 2019-09-30 | Disposition: A | Discharge status: Home / Self Care | Payer: BC Managed Care – PPO | Source: Ambulatory Visit

## 2019-09-30 DIAGNOSIS — Z1231 Encounter for screening mammogram for malignant neoplasm of breast: Secondary | ICD-10-CM

## 2019-10-03 ENCOUNTER — Other Ambulatory Visit: Payer: Self-pay | Admitting: Obstetrics & Gynecology

## 2019-10-03 DIAGNOSIS — R928 Other abnormal and inconclusive findings on diagnostic imaging of breast: Secondary | ICD-10-CM

## 2019-10-15 ENCOUNTER — Ambulatory Visit
Admission: RE | Admit: 2019-10-15 | Discharge: 2019-10-15 | Disposition: A | Discharge status: Home / Self Care | Payer: BC Managed Care – PPO | Source: Ambulatory Visit | Attending: Obstetrics & Gynecology | Admitting: Obstetrics & Gynecology

## 2019-10-15 ENCOUNTER — Other Ambulatory Visit: Payer: Self-pay

## 2019-10-15 DIAGNOSIS — R928 Other abnormal and inconclusive findings on diagnostic imaging of breast: Secondary | ICD-10-CM

## 2020-03-15 ENCOUNTER — Encounter: Payer: BC Managed Care – PPO | Admitting: Obstetrics & Gynecology

## 2020-04-22 ENCOUNTER — Ambulatory Visit: Payer: BC Managed Care – PPO | Admitting: Obstetrics & Gynecology

## 2020-04-22 ENCOUNTER — Encounter: Payer: Self-pay | Admitting: Obstetrics & Gynecology

## 2020-04-22 ENCOUNTER — Other Ambulatory Visit: Payer: Self-pay

## 2020-04-22 VITALS — BP 124/80 | Ht 66.0 in | Wt 281.0 lb

## 2020-04-22 DIAGNOSIS — Z30431 Encounter for routine checking of intrauterine contraceptive device: Secondary | ICD-10-CM

## 2020-04-22 DIAGNOSIS — Z6841 Body Mass Index (BMI) 40.0 and over, adult: Secondary | ICD-10-CM

## 2020-04-22 DIAGNOSIS — K5190 Ulcerative colitis, unspecified, without complications: Secondary | ICD-10-CM | POA: Diagnosis not present

## 2020-04-22 DIAGNOSIS — Z01419 Encounter for gynecological examination (general) (routine) without abnormal findings: Secondary | ICD-10-CM

## 2020-04-22 NOTE — Addendum Note (Signed)
Addended by: Lorine Bears on: 04/22/2020 08:52 AM   Modules accepted: Orders

## 2020-04-22 NOTE — Progress Notes (Signed)
Rebekah Silva [birth year] [id number]   History:    50 y.o. G1P1L1Married. Son is 55 yo (gender change to female)  SL:[id number] presenting for annual gyn exam   TDH:RCBU on Mirena IUD x 01/2016.No breakthrough bleeding. No pelvic pain. No pain with intercourse. Urine normal. Ulcerative colitis flared up recently, Colono scheduled soon. Breast normal. Body mass index mild improvement to 45.35.  Needs to increase physical activity. Health labs with family physician.   Past medical history,surgical history, family history and social history were all reviewed and documented in the EPIC chart.  Gynecologic History No LMP recorded (lmp unknown). (Menstrual status: IUD).  Obstetric History OB History  Gravida Para Term Preterm AB Living  1 1 1    0 1  SAB IAB Ectopic Multiple Live Births  0   0   1    # Outcome Date GA Lbr Len/2nd Weight Sex Delivery Anes PTL Lv  1 Term     M CS-Unspec   LIV     ROS: A ROS was performed and pertinent positives and negatives are included in the history.  GENERAL: No fevers or chills. HEENT: No change in vision, no earache, sore throat or sinus congestion. NECK: No pain or stiffness. CARDIOVASCULAR: No chest pain or pressure. No palpitations. PULMONARY: No shortness of breath, cough or wheeze. GASTROINTESTINAL: No abdominal pain, nausea, vomiting or diarrhea, melena or bright red blood per rectum. GENITOURINARY: No urinary frequency, urgency, hesitancy or dysuria. MUSCULOSKELETAL: No joint or muscle pain, no back pain, no recent trauma. DERMATOLOGIC: No rash, no itching, no lesions. ENDOCRINE: No polyuria, polydipsia, no heat or cold intolerance. No recent change in weight. HEMATOLOGICAL: No anemia or easy bruising or bleeding. NEUROLOGIC: No headache, seizures, numbness, tingling or weakness. PSYCHIATRIC: No depression, no loss of interest in normal activity or change in sleep pattern.     Exam:   BP 124/80 (BP Location:  Right Arm, Patient Position: Sitting, Cuff Size: Large)   Ht 5' 6"  (1.676 m)   Wt 281 lb (127.5 kg)   LMP  (LMP Unknown) Comment: SPOTTING  BMI 45.35 kg/m   Body mass index is 45.35 kg/m.  General appearance : Well developed well nourished female. No acute distress HEENT: Eyes: no retinal hemorrhage or exudates,  Neck supple, trachea midline, no carotid bruits, no thyroidmegaly Lungs: Clear to auscultation, no rhonchi or wheezes, or rib retractions  Heart: Regular rate and rhythm, no murmurs or gallops Breast:Examined in sitting and supine position were symmetrical in appearance, no palpable masses or tenderness,  no skin retraction, no nipple inversion, no nipple discharge, no skin discoloration, no axillary or supraclavicular lymphadenopathy Abdomen: no palpable masses or tenderness, no rebound or guarding Extremities: no edema or skin discoloration or tenderness  Pelvic: Vulva: Normal             Vagina: No gross lesions or discharge  Cervix: No gross lesions or discharge. [facility]UD strings felt at [facility]. Pap reflex done.  Uterus  AV, normal size, shape and consistency, non-tender and mobile  Adnexa  Without masses or tenderness  Anus: Normal   Assessment/Plan:  50 y.o. female for annual exam   1. Encounter for routine gynecological examination with Papanicolaou smear of cervix Normal gynecologic exam.  Pap reflex done.  Breast exam normal.  Screening mammo Rt Neg 09/2019/Lt Dx mammo Benign 10/2019.  Health labs with Fam MD.  2. Encounter for routine checking of intrauterine contraceptive device (IUD) Well on Mirena IUD x 01/2016.  IUD in  good position.  Will f/u 01/2021 for Mirena IUD change.  3. Class 3 severe obesity due to excess calories with serious comorbidity and body mass index (BMI) of 45.0 to 49.9 in adult [facility]) Lower Calorie/Carb diet.  Aerobic activities 5 times a week with light weight lifting every 2 days.  4. Ulcerative colitis without complications, unspecified location  [facility]) Colonoscopy scheduled soon.  Princess Bruins MD, 8:19 AM 04/22/2020

## 2020-04-23 LAB — PAP IG W/ RFLX HPV ASCU

## 2020-09-01 ENCOUNTER — Other Ambulatory Visit: Payer: Self-pay | Admitting: Obstetrics & Gynecology

## 2020-09-01 DIAGNOSIS — Z1231 Encounter for screening mammogram for malignant neoplasm of breast: Secondary | ICD-10-CM

## 2020-10-19 ENCOUNTER — Ambulatory Visit
Admission: RE | Admit: 2020-10-19 | Discharge: 2020-10-19 | Disposition: A | Discharge status: Home / Self Care | Payer: Self-pay | Source: Ambulatory Visit | Attending: Obstetrics & Gynecology | Admitting: Obstetrics & Gynecology

## 2020-10-19 ENCOUNTER — Other Ambulatory Visit: Payer: Self-pay

## 2020-10-19 DIAGNOSIS — Z1231 Encounter for screening mammogram for malignant neoplasm of breast: Secondary | ICD-10-CM

## 2020-10-22 ENCOUNTER — Ambulatory Visit: Payer: BC Managed Care – PPO

## 2021-03-15 ENCOUNTER — Ambulatory Visit: Payer: BC Managed Care – PPO | Admitting: Nurse Practitioner

## 2021-04-25 ENCOUNTER — Encounter: Payer: Self-pay | Admitting: Nurse Practitioner

## 2021-04-25 ENCOUNTER — Ambulatory Visit (INDEPENDENT_AMBULATORY_CARE_PROVIDER_SITE_OTHER): Payer: BC Managed Care – PPO | Admitting: Nurse Practitioner

## 2021-04-25 ENCOUNTER — Other Ambulatory Visit: Payer: Self-pay

## 2021-04-25 VITALS — BP 124/84 | Ht 66.0 in | Wt 259.0 lb

## 2021-04-25 DIAGNOSIS — Z01419 Encounter for gynecological examination (general) (routine) without abnormal findings: Secondary | ICD-10-CM

## 2021-04-25 DIAGNOSIS — Z30431 Encounter for routine checking of intrauterine contraceptive device: Secondary | ICD-10-CM

## 2021-04-25 DIAGNOSIS — Z13820 Encounter for screening for osteoporosis: Secondary | ICD-10-CM | POA: Diagnosis not present

## 2021-04-25 DIAGNOSIS — B3731 Acute candidiasis of vulva and vagina: Secondary | ICD-10-CM

## 2021-04-25 DIAGNOSIS — Z7952 Long term (current) use of systemic steroids: Secondary | ICD-10-CM | POA: Diagnosis not present

## 2021-04-25 DIAGNOSIS — N898 Other specified noninflammatory disorders of vagina: Secondary | ICD-10-CM

## 2021-04-25 LAB — WET PREP FOR TRICH, YEAST, CLUE

## 2021-04-25 MED ORDER — FLUCONAZOLE 150 MG PO TABS: 150.0000 mg | ORAL_TABLET | ORAL | 0 refills | Status: DC

## 2021-04-25 NOTE — Progress Notes (Signed)
Rebekah Silva Dec 04, 1970 [id number]   History:  51 y.o. G1P1001 presents for annual exam. Mirena IUD 01/2016. Amenorrheic. Normal pap history. History of colitis with chronic steroid use.   Gynecologic History No LMP recorded. (Menstrual status: IUD).   Contraception/Family planning: IUD Sexually active: Yes  Health Maintenance Last Pap: 04/22/2020. Results were: Normal, 3-year repeat Last mammogram: 10/19/2020. Results were: Normal Last colonoscopy: 2021. Results were: every 2 years d/t colitis Last Dexa: 09/12/2012  Past medical history, past surgical history, family history and social history were all reviewed and documented in the EPIC chart. Married. Licensed conveyancer. 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 Mother with history of breast cancer in her 71s, maternal aunt in her 34s.   ROS:  A ROS was performed and pertinent positives and negatives are included.  Exam:  Vitals:   04/25/21 1331  BP: 124/84  Weight: 259 lb (117.5 kg)  Height: 5' 6"  (1.676 m)   Body mass index is 41.8 kg/m.  General appearance:  Normal Thyroid:  Symmetrical, normal in size, without palpable masses or nodularity. Respiratory  Auscultation:  Clear without wheezing or rhonchi Cardiovascular  Auscultation:  Regular rate, without rubs, murmurs or gallops  Edema/varicosities:  Not grossly evident Abdominal  Soft,nontender, without masses, guarding or rebound.  Liver/spleen:  No organomegaly noted  Hernia:  None appreciated  Skin  Inspection:  Grossly normal Breasts: Examined lying and sitting.   Right: Without masses, retractions, nipple discharge or axillary adenopathy.   Left: Without masses, retractions, nipple discharge or axillary adenopathy. Genitourinary   Inguinal/mons:  Normal without inguinal adenopathy  External genitalia:  Normal appearing vulva with no masses, tenderness, or lesions  BUS/Urethra/Skene's glands:  Normal  Vagina:  Normal appearing with normal color, no lesions. Thick discharge  present  Cervix:  Normal appearing without discharge or lesions  Uterus:  Normal in size, shape and contour.  Midline and mobile, nontender. IUD strings felt  Adnexa/parametria:     Rt: Normal in size, without masses or tenderness.   Lt: Normal in size, without masses or tenderness.  Anus and perineum: Normal  Digital rectal exam: Deferred  Patient informed chaperone available to be present for breast and pelvic exam. Patient has requested no chaperone to be present. Patient has been advised what will be completed during breast and pelvic exam.   Wet prep  + yeast, (budding/hyphae/pseudohyphae noted)  Assessment/Plan:  51 y.o. G1P1001 for annual exam.   Well female exam with routine gynecological exam - Education provided on SBEs, importance of preventative screenings, current guidelines, high calcium diet, regular exercise, and multivitamin daily. Labs with PCP.   Encounter for routine checking of intrauterine contraceptive device (IUD) - Mirena inserted 01/2016, amenorrheic. She is aware of 8-year FDA approval.   Current chronic use of systemic steroids - Plan: [facility] Bone Density. History of colitis. Has had flare since early fall.   Screening for osteoporosis - Plan: DG Bone Density. Increased risk d/t chronic systemic steroid use, recently diagnosed with OA as well.   Vaginal discharge - Plan: [place], CLUE. Wet prep positive for yeast.   Vaginal candidiasis - Plan: fluconazole (DIFLUCAN) 150 MG tablet today and repeat in 3 days for total of 2 doses.   Screening for cervical cancer - Normal Pap history.  Will repeat at 3-year interval per guidelines.  Screening for breast cancer - Normal mammogram history.  Continue annual screenings.  Normal breast exam today. Mother and maternal aunt with history of breast cancer.   Screening  for colon cancer - 2021 colonoscopy. She gets colonoscopies every 2 years d/t colitis.   Return in 1 year for annual.     Tamela Gammon DNP, 1:40 PM 04/25/2021

## 2021-05-25 ENCOUNTER — Encounter (HOSPITAL_COMMUNITY): Payer: Self-pay | Admitting: Emergency Medicine

## 2021-05-25 ENCOUNTER — Inpatient Hospital Stay (HOSPITAL_COMMUNITY)
Admission: AD | Admit: 2021-05-25 | Discharge: 2021-06-02 | DRG: 885 | Disposition: A | Discharge status: Home / Self Care | Payer: BC Managed Care – PPO | Source: Intra-hospital | Attending: Psychiatry | Admitting: Psychiatry

## 2021-05-25 ENCOUNTER — Ambulatory Visit (HOSPITAL_COMMUNITY)
Admission: EM | Admit: 2021-05-25 | Discharge: 2021-05-25 | Disposition: A | Discharge status: Other Acute Inpatient Hospital | Payer: BC Managed Care – PPO | Attending: Family | Admitting: Family

## 2021-05-25 DIAGNOSIS — F410 Panic disorder [episodic paroxysmal anxiety] without agoraphobia: Secondary | ICD-10-CM | POA: Diagnosis present

## 2021-05-25 DIAGNOSIS — Z885 Allergy status to narcotic agent status: Secondary | ICD-10-CM | POA: Diagnosis not present

## 2021-05-25 DIAGNOSIS — K219 Gastro-esophageal reflux disease without esophagitis: Secondary | ICD-10-CM | POA: Diagnosis present

## 2021-05-25 DIAGNOSIS — Z833 Family history of diabetes mellitus: Secondary | ICD-10-CM | POA: Diagnosis not present

## 2021-05-25 DIAGNOSIS — Z880 Allergy status to penicillin: Secondary | ICD-10-CM | POA: Diagnosis not present

## 2021-05-25 DIAGNOSIS — Z79899 Other long term (current) drug therapy: Secondary | ICD-10-CM

## 2021-05-25 DIAGNOSIS — R45851 Suicidal ideations: Secondary | ICD-10-CM | POA: Insufficient documentation

## 2021-05-25 DIAGNOSIS — F1290 Cannabis use, unspecified, uncomplicated: Secondary | ICD-10-CM | POA: Diagnosis present

## 2021-05-25 DIAGNOSIS — Z886 Allergy status to analgesic agent status: Secondary | ICD-10-CM

## 2021-05-25 DIAGNOSIS — F332 Major depressive disorder, recurrent severe without psychotic features: Secondary | ICD-10-CM | POA: Diagnosis present

## 2021-05-25 DIAGNOSIS — F605 Obsessive-compulsive personality disorder: Secondary | ICD-10-CM | POA: Diagnosis present

## 2021-05-25 DIAGNOSIS — Z20822 Contact with and (suspected) exposure to covid-19: Secondary | ICD-10-CM | POA: Diagnosis not present

## 2021-05-25 DIAGNOSIS — K5190 Ulcerative colitis, unspecified, without complications: Secondary | ICD-10-CM | POA: Diagnosis present

## 2021-05-25 DIAGNOSIS — H409 Unspecified glaucoma: Secondary | ICD-10-CM | POA: Diagnosis present

## 2021-05-25 DIAGNOSIS — M069 Rheumatoid arthritis, unspecified: Secondary | ICD-10-CM | POA: Diagnosis present

## 2021-05-25 DIAGNOSIS — G43909 Migraine, unspecified, not intractable, without status migrainosus: Secondary | ICD-10-CM | POA: Diagnosis present

## 2021-05-25 DIAGNOSIS — J301 Allergic rhinitis due to pollen: Secondary | ICD-10-CM | POA: Diagnosis present

## 2021-05-25 DIAGNOSIS — Z818 Family history of other mental and behavioral disorders: Secondary | ICD-10-CM

## 2021-05-25 DIAGNOSIS — F4010 Social phobia, unspecified: Secondary | ICD-10-CM | POA: Diagnosis present

## 2021-05-25 DIAGNOSIS — Z8249 Family history of ischemic heart disease and other diseases of the circulatory system: Secondary | ICD-10-CM

## 2021-05-25 DIAGNOSIS — Z6841 Body Mass Index (BMI) 40.0 and over, adult: Secondary | ICD-10-CM | POA: Diagnosis not present

## 2021-05-25 DIAGNOSIS — Z91011 Allergy to milk products: Secondary | ICD-10-CM

## 2021-05-25 DIAGNOSIS — Z803 Family history of malignant neoplasm of breast: Secondary | ICD-10-CM

## 2021-05-25 DIAGNOSIS — Z62810 Personal history of physical and sexual abuse in childhood: Secondary | ICD-10-CM | POA: Diagnosis present

## 2021-05-25 DIAGNOSIS — K529 Noninfective gastroenteritis and colitis, unspecified: Secondary | ICD-10-CM | POA: Diagnosis present

## 2021-05-25 DIAGNOSIS — Z7952 Long term (current) use of systemic steroids: Secondary | ICD-10-CM

## 2021-05-25 DIAGNOSIS — E7800 Pure hypercholesterolemia, unspecified: Secondary | ICD-10-CM | POA: Diagnosis present

## 2021-05-25 DIAGNOSIS — Z975 Presence of (intrauterine) contraceptive device: Secondary | ICD-10-CM | POA: Diagnosis not present

## 2021-05-25 DIAGNOSIS — Z888 Allergy status to other drugs, medicaments and biological substances status: Secondary | ICD-10-CM

## 2021-05-25 LAB — CBC WITH DIFFERENTIAL/PLATELET
Abs Immature Granulocytes: 0.03 10*3/uL (ref 0.00–0.07)
Basophils Absolute: 0 10*3/uL (ref 0.0–0.1)
Basophils Relative: 0 %
Eosinophils Absolute: 0 10*3/uL (ref 0.0–0.5)
Eosinophils Relative: 0 %
HCT: 45.4 % (ref 36.0–46.0)
Hemoglobin: 14.7 g/dL (ref 12.0–15.0)
Immature Granulocytes: 0 %
Lymphocytes Relative: 23 %
Lymphs Abs: 2.8 10*3/uL (ref 0.7–4.0)
MCH: 29 pg (ref 26.0–34.0)
MCHC: 32.4 g/dL (ref 30.0–36.0)
MCV: 89.5 fL (ref 80.0–100.0)
Monocytes Absolute: 0.8 10*3/uL (ref 0.1–1.0)
Monocytes Relative: 7 %
Neutro Abs: 8.5 10*3/uL — ABNORMAL HIGH (ref 1.7–7.7)
Neutrophils Relative %: 70 %
Platelets: 425 10*3/uL — ABNORMAL HIGH (ref 150–400)
RBC: 5.07 MIL/uL (ref 3.87–5.11)
RDW: 16.6 % — ABNORMAL HIGH (ref 11.5–15.5)
WBC: 12.1 10*3/uL — ABNORMAL HIGH (ref 4.0–10.5)
nRBC: 0 % (ref 0.0–0.2)

## 2021-05-25 LAB — COMPREHENSIVE METABOLIC PANEL
ALT: 32 U/L (ref 0–44)
AST: 30 U/L (ref 15–41)
Albumin: 4.1 g/dL (ref 3.5–5.0)
Alkaline Phosphatase: 66 U/L (ref 38–126)
Anion gap: 13 (ref 5–15)
BUN: 7 mg/dL (ref 6–20)
CO2: 22 mmol/L (ref 22–32)
Calcium: 9.2 mg/dL (ref 8.9–10.3)
Chloride: 104 mmol/L (ref 98–111)
Creatinine, Ser: 0.84 mg/dL (ref 0.44–1.00)
GFR, Estimated: >60 mL/min (ref >60)
Glucose, Bld: 85 mg/dL (ref 70–99)
Potassium: 3.3 mmol/L — ABNORMAL LOW (ref 3.5–5.1)
Sodium: 139 mmol/L (ref 135–145)
Total Bilirubin: 0.7 mg/dL (ref 0.3–1.2)
Total Protein: 7.6 g/dL (ref 6.5–8.1)

## 2021-05-25 LAB — ETHANOL: Alcohol, Ethyl (B): <10 mg/dL (ref <10)

## 2021-05-25 LAB — POC SARS CORONAVIRUS 2 AG: SARSCOV2ONAVIRUS 2 AG: NEGATIVE

## 2021-05-25 LAB — LIPID PANEL
Cholesterol: 220 mg/dL — ABNORMAL HIGH (ref 0–200)
HDL: 55 mg/dL (ref >40)
LDL Cholesterol: 139 mg/dL — ABNORMAL HIGH (ref 0–99)
Total CHOL/HDL Ratio: 4 RATIO
Triglycerides: 132 mg/dL (ref <150)
VLDL: 26 mg/dL (ref 0–40)

## 2021-05-25 LAB — TSH: TSH: 1.514 u[IU]/mL (ref 0.350–4.500)

## 2021-05-25 LAB — RESP PANEL BY RT-PCR (FLU A&B, COVID) ARPGX2
Influenza A by PCR: NEGATIVE
Influenza B by PCR: NEGATIVE
SARS Coronavirus 2 by RT PCR: POSITIVE — AB

## 2021-05-25 LAB — MAGNESIUM: Magnesium: 2.1 mg/dL (ref 1.7–2.4)

## 2021-05-25 MED ORDER — LORATADINE 10 MG PO TABS
10.0000 mg | ORAL_TABLET | Freq: Every day | ORAL | Status: DC
  Administered 2021-05-26 – 2021-06-02 (×8): 10 mg via ORAL
  Filled 2021-05-25 (×11): qty 1

## 2021-05-25 MED ORDER — BUPROPION HCL ER (XL) 150 MG PO TB24
150.0000 mg | ORAL_TABLET | Freq: Every morning | ORAL | Status: DC
  Administered 2021-05-26 – 2021-06-02 (×8): 150 mg via ORAL
  Filled 2021-05-25 (×13): qty 1

## 2021-05-25 MED ORDER — BUPROPION HCL ER (XL) 150 MG PO TB24: 150.0000 mg | ORAL_TABLET | Freq: Every morning | ORAL | Status: DC

## 2021-05-25 MED ORDER — LATANOPROST 0.005 % OP SOLN
1.0000 [drp] | Freq: Every day | OPHTHALMIC | Status: DC
  Administered 2021-05-25 – 2021-06-01 (×8): 1 [drp] via OPHTHALMIC
  Filled 2021-05-25: qty 2.5

## 2021-05-25 MED ORDER — MESALAMINE 1.2 G PO TBEC
4.8000 g | DELAYED_RELEASE_TABLET | Freq: Every day | ORAL | Status: DC
  Filled 2021-05-25: qty 4

## 2021-05-25 MED ORDER — TOPIRAMATE 100 MG PO TABS
100.0000 mg | ORAL_TABLET | Freq: Every day | ORAL | Status: DC
  Administered 2021-05-25: 100 mg via ORAL
  Filled 2021-05-25: qty 1

## 2021-05-25 MED ORDER — TRAZODONE HCL 50 MG PO TABS
50.0000 mg | ORAL_TABLET | Freq: Every evening | ORAL | Status: DC | PRN
Start: 2021-05-25 — End: 2021-05-25

## 2021-05-25 MED ORDER — DULOXETINE HCL 30 MG PO CPEP
30.0000 mg | ORAL_CAPSULE | Freq: Every day | ORAL | Status: DC
  Administered 2021-05-25: 30 mg via ORAL
  Filled 2021-05-25: qty 1

## 2021-05-25 MED ORDER — ADULT MULTIVITAMIN W/MINERALS CH: 1.0000 | ORAL_TABLET | Freq: Every day | ORAL | Status: DC

## 2021-05-25 MED ORDER — OYSTER SHELL CALCIUM/D3 500-5 MG-MCG PO TABS
1.0000 | ORAL_TABLET | Freq: Every day | ORAL | Status: DC
  Administered 2021-05-26 – 2021-06-01 (×7): 1 via ORAL
  Filled 2021-05-25 (×10): qty 1

## 2021-05-25 MED ORDER — OYSTER SHELL CALCIUM/D3 500-5 MG-MCG PO TABS
1.0000 | ORAL_TABLET | Freq: Every day | ORAL | Status: DC
  Administered 2021-05-25: 1 via ORAL
  Filled 2021-05-25: qty 1

## 2021-05-25 MED ORDER — ADULT MULTIVITAMIN W/MINERALS CH
1.0000 | ORAL_TABLET | Freq: Every day | ORAL | Status: DC
  Administered 2021-05-26 – 2021-06-02 (×8): 1 via ORAL
  Filled 2021-05-25 (×11): qty 1

## 2021-05-25 MED ORDER — SODIUM CHLORIDE 0.9 % IN NEBU
INHALATION_SOLUTION | RESPIRATORY_TRACT | Status: AC
  Administered 2021-05-25: 1 mL
  Filled 2021-05-25: qty 6

## 2021-05-25 MED ORDER — ATORVASTATIN CALCIUM 10 MG PO TABS
20.0000 mg | ORAL_TABLET | Freq: Every day | ORAL | Status: DC
  Administered 2021-05-25: 20 mg via ORAL
  Filled 2021-05-25: qty 2

## 2021-05-25 MED ORDER — ATORVASTATIN CALCIUM 20 MG PO TABS
20.0000 mg | ORAL_TABLET | Freq: Every day | ORAL | Status: DC
  Administered 2021-05-26 – 2021-06-01 (×6): 20 mg via ORAL
  Filled 2021-05-25 (×9): qty 1

## 2021-05-25 MED ORDER — ALUM & MAG HYDROXIDE-SIMETH 200-200-20 MG/5ML PO SUSP: 30.0000 mL | ORAL | Status: DC | PRN

## 2021-05-25 MED ORDER — ACETAMINOPHEN 325 MG PO TABS: 650.0000 mg | ORAL_TABLET | Freq: Four times a day (QID) | ORAL | Status: DC | PRN

## 2021-05-25 MED ORDER — LATANOPROST 0.005 % OP SOLN
1.0000 [drp] | Freq: Every day | OPHTHALMIC | Status: DC
  Filled 2021-05-25: qty 2.5

## 2021-05-25 MED ORDER — SUMATRIPTAN SUCCINATE 50 MG PO TABS: 100.0000 mg | ORAL_TABLET | ORAL | Status: DC | PRN

## 2021-05-25 MED ORDER — LORATADINE 10 MG PO TABS: 10.0000 mg | ORAL_TABLET | Freq: Every day | ORAL | Status: DC

## 2021-05-25 MED ORDER — HYDROXYZINE HCL 25 MG PO TABS
25.0000 mg | ORAL_TABLET | Freq: Three times a day (TID) | ORAL | Status: DC | PRN
Start: 2021-05-25 — End: 2021-05-25

## 2021-05-25 MED ORDER — DULOXETINE HCL 30 MG PO CPEP
30.0000 mg | ORAL_CAPSULE | Freq: Every day | ORAL | Status: DC
  Filled 2021-05-25 (×2): qty 1

## 2021-05-25 MED ORDER — TOPIRAMATE 100 MG PO TABS
100.0000 mg | ORAL_TABLET | Freq: Every day | ORAL | Status: DC
  Administered 2021-05-26 – 2021-05-30 (×5): 100 mg via ORAL
  Filled 2021-05-25 (×7): qty 1

## 2021-05-25 MED ORDER — MAGNESIUM HYDROXIDE 400 MG/5ML PO SUSP: 30.0000 mL | Freq: Every day | ORAL | Status: DC | PRN

## 2021-05-25 MED ORDER — MESALAMINE 1.2 G PO TBEC
4.8000 g | DELAYED_RELEASE_TABLET | Freq: Every day | ORAL | Status: DC
Start: 2021-05-26 — End: 2021-06-02
  Administered 2021-05-26 – 2021-06-02 (×8): 4.8 g via ORAL
  Filled 2021-05-25 (×11): qty 4

## 2021-05-25 NOTE — ED Notes (Signed)
[place] eye gtt held until pt can obtain contact lens care supplies will notify [facility] staff Cassell Smiles RN ?

## 2021-05-25 NOTE — ED Provider Notes (Signed)
Behavioral Health Admission H&P ?(FBC & OBS) ? ?Date: 05/25/21 ?Patient Name: Rebekah Silva ?MRN: [id number] ?Chief Complaint:  ?Chief Complaint  ?Patient presents with  ? Suicidal  ?   ? ?Diagnoses:  ?Final diagnoses:  ?MDD (major depressive disorder), recurrent severe, without psychosis ([place])  ? ? ?HPI: Patient presents voluntarily to [facility] behavioral health for walk-in assessment.  ? ?Rebekah Silva endorses suicidal ideation with plan to run her car into a tree while driving this morning.  She reports symptoms of depression including depressed mood, tearful episodes, feelings of hopelessness, feelings of worthlessness and suicidal ideation with plan and intent. ? 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  A chaperone/parent harshly discipline students and patient had forgotten to vet this volunteer.  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 ? ?She has been diagnosed with depression and anxiety.  At this time her medications are managed by primary care provider, these include Wellbutrin each morning and Cymbalta each evening.  She is compliant with these medications, last dose of Wellbutrin earlier this date, last dose of Cymbalta on last night.  She is also followed by outpatient counseling, virtual format, meets with counselor, Rebekah Silva once per month.  She denies history of inpatient psychiatric hospitalization.  Family history includes her mother who has been diagnosed with depression. ? ?Patient is assessed face-to-face by nurse practitioner.  She is seated in  assessment area, no acute distress.  She is alert and oriented, pleasant and cooperative during assessment.  ?She presents with depressed mood, tearful affect.  She continues to endorse suicidal ideation with plan, she is unable to contract verbally for safety at this time.   ?She denies history of suicide attempts.  She endorses nonsuicidal self-harm including pulling out her hair and biting her fingernails until they bleed.  Last self-harm earlier this date when she bit her fingernails.   ? ?She denies homicidal ideations.  She has normal speech and behavior.  She denies both auditory and visual hallucinations.  Patient is able to converse coherently with goal-directed thoughts and no distractibility or preoccupation.  She denies paranoia.  Objectively there is no evidence of psychosis/mania or delusional thinking. ? ?Rebekah Silva resides in [place] with her spouse and 75 year old child.  She denies access to weapons.  She is employed as a Education officer, museum.  Patient endorses average sleep and appetite.  She endorses rare alcohol use, approximately 1 drink per month.  She denies any history of alcohol-related seizure, denies blackouts.  She endorses marijuana use, rarely.  Last marijuana use approximately 2 weeks ago. ? ?Patient offered support and encouragement. ? ? ?PHQ 2-9:  ?Flowsheet Row ED from 05/25/2021 in [facility]  ?Thoughts that you would be better off dead, or of hurting yourself in some way Several days  ?PHQ-9 Total Score 9  ? ?  ?  ?[facility] from 05/25/2021 in [facility]  ?C-SSRS RISK CATEGORY High Risk  ? ?  ?  ? ?Total Time spent with patient: 30 minutes ? ?Musculoskeletal  ?  Strength & Muscle Tone: within normal limits ?Gait & Station: normal ?Patient leans: N/A ? ?Psychiatric Specialty Exam  ?Presentation ?General Appearance: Appropriate for Environment; Casual ? ?Eye Contact:Good ? ?Speech:Clear and Coherent; Normal Rate ? ?Speech  Volume:Normal ? ?Handedness:No data recorded ? ?Mood and Affect  ?Mood:Depressed; Hopeless; Worthless ? ?Affect:Depressed; Tearful ? ? ?Thought Process  ?Thought Processes:Coherent; Goal Directed; Linear ? ?Descriptions of Associations:Intact ? ?Orientation:Full (Time, Place and Person) ? ?Thought Content:Logical; WDL ? Diagnosis of Schizophrenia or Schizoaffective disorder in past: No ?  ?Hallucinations:Hallucinations: None ? ?Ideas of Reference:None ? ?Suicidal Thoughts:Suicidal Thoughts: Yes, Active ?SI Active Intent and/or Plan: With Plan; With Intent ? ?Homicidal Thoughts:Homicidal Thoughts: No ? ? ?Sensorium  ?Memory:Immediate Good; Recent Good; Remote Good ? ?Judgment:Good ? ?Insight:Fair ? ? ?Executive Functions  ?Concentration:Good ? ?Attention Span:Good ? ?Recall:Good ? ?Fund of [place]ycha ? ?Language:Good ? ? ?Psychomotor Activity  ?Psychomotor Activity:Psychomotor Activity: Normal ? ? ?Assets  ?Assets:Communication Skills; Desire for Improvement; Financial Resources/Insurance; Housing; Intimacy; Leisure Time; Physical Health; Resilience; Social Support; Talents/Skills ? ? ?Sleep  ?Sleep:Sleep: Fair ? ? ?Nutritional Assessment (For OBS and FBC admissions only) ?Has the patient had a weight loss or gain of 10 pounds or more in the last 3 months?: No ?Has the patient had a decrease in food intake/or appetite?: No ?Does the patient have dental problems?: No ?Does the patient have eating habits or behaviors that may be indicators of an eating disorder including binging or inducing vomiting?: No ?Has the patient recently lost weight without trying?: 0 ?Has the patient been eating poorly because of a decreased appetite?: 0 ?Malnutrition Screening Tool Score: 0 ? ? ? ?Physical Exam ?Vitals and nursing note reviewed.  ?Constitutional:   ?   Appearance: Normal appearance. She is well-developed.  ?HENT:  ?   Head: Normocephalic and atraumatic.  ?   Nose: Nose normal.  ?Cardiovascular:  ?   Rate and Rhythm:  Normal rate.  ?Pulmonary:  ?   Effort: Pulmonary effort is normal.  ?Musculoskeletal:     ?   General: Normal range of motion.  ?   Cervical back: Normal range of motion.  ?Neurological:  ?   Mental Status: She is alert and oriented to person, place, and time.  ?Psychiatric:     ?   Attention and Perception: Attention and perception normal.     ?   Mood and Affect: Mood is depressed. Affect is tearful.     ?   Speech: Speech normal.     ?   Behavior: Behavior normal. Behavior is cooperative.     ?   Thought Content: Thought content includes suicidal ideation. Thought content includes suicidal plan.     ?   Cognition and Memory: Cognition and memory normal.     ?   Judgment: Judgment normal.  ? ?Review of Systems  ?Constitutional: Negative.   ?HENT: Negative.    ?Eyes: Negative.   ?Respiratory: Negative.    ?Cardiovascular: Negative.   ?Gastrointestinal: Negative.   ?Genitourinary: Negative.   ?Musculoskeletal: Negative.   ?Skin: Negative.   ?Neurological: Negative.   ?Endo/Heme/Allergies: Negative.   ?Psychiatric/Behavioral:  Positive for depression and suicidal ideas.   ? ?Blood pressure 114/88, pulse 100, temperature 98.6 ?F (37 ?C), temperature source Oral, resp. rate 19, SpO2 100 %. There is no height or weight on file to calculate BMI. ? ?Past Psychiatric History: Anxiety, depression ? ?Is the patient at risk to self? Yes  ?Has the patient been a risk to self in  the past 6 months? No .    ?Has the patient been a risk to self within the distant past? No   ?Is the patient a risk to others? No   ?Has the patient been a risk to others in the past 6 months? No   ?Has the patient been a risk to others within the distant past? No  ? ?Past Medical History:  ?Past Medical History:  ?Diagnosis Date  ? Arthritis   ? ra  ? Colitis   ? GERD (gastroesophageal reflux disease)   ? Hypercholesterolemia   ? DR. WHITE  ? Migraines   ? PIH (pregnancy induced hypertension) yrs ago, none since  ? PONV (postoperative nausea and  vomiting)   ? Wears glasses   ?  ?Past Surgical History:  ?Procedure Laterality Date  ? ANAL FISSURE REPAIR N/A 04/09/2015  ? Procedure: ANAL CHEMICAL DENERVATION(BOTOX);  Surgeon: Ralene Ok, MD;  Location: [facility];  S

## 2021-05-25 NOTE — ED Notes (Signed)
Pt being transport to [facility] via safe transport ?

## 2021-05-25 NOTE — ED Notes (Signed)
Pt A& Ox4, pt is COVID positive, resting at present.  Pending report & transfer to [facility].  Monitoring for safety. ?
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
## 2021-05-25 NOTE — ED Provider Notes (Addendum)
Patient reports she had a positive COVID test result at [facility] pharmacy on 05/09/2021.  She did quarantine from 05/09/2021 through 05/13/2021.  She also completed Paxlovid as prescribed.  Patient currently asymptomatic without complaint.  Patient reviewed with Dr. Serafina Mitchell.  Discussed with ID contact, Mark. ?Per infectious disease COVID test and CT value suggest previous infection.  Patient does not require isolation and is appropriate for inpatient psychiatric hospitalization, can be considered at [facility] behavioral health. ?
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
## 2021-05-25 NOTE — Progress Notes (Signed)
Patient is alert and oriented X 4, with active SI, denies Auditory and Visual hallucinations. Patient is pleasant but tearful, stating, "The depression just took a hold of me this time and it's strong." Patient also states, "Reality and  is not adding up."  Skin assessment complete there is an abrasion on abdomen. Nursing staff will continue to monitor. ?

## 2021-05-25 NOTE — ED Notes (Signed)
Safe transport contacted for transport to [phone] ?

## 2021-05-26 ENCOUNTER — Other Ambulatory Visit: Payer: Self-pay

## 2021-05-26 DIAGNOSIS — F332 Major depressive disorder, recurrent severe without psychotic features: Principal | ICD-10-CM

## 2021-05-26 LAB — RAPID URINE DRUG SCREEN, HOSP PERFORMED
Amphetamines: NOT DETECTED
Barbiturates: NOT DETECTED
Benzodiazepines: NOT DETECTED
Cocaine: NOT DETECTED
Opiates: NOT DETECTED
Tetrahydrocannabinol: POSITIVE — AB

## 2021-05-26 LAB — HEMOGLOBIN A1C
Hgb A1c MFr Bld: 5.1 % (ref 4.8–5.6)
Mean Plasma Glucose: 100 mg/dL

## 2021-05-26 LAB — PROLACTIN: Prolactin: 13 ng/mL (ref 4.8–23.3)

## 2021-05-26 MED ORDER — PROPRANOLOL HCL 10 MG PO TABS
10.0000 mg | ORAL_TABLET | Freq: Two times a day (BID) | ORAL | Status: DC
  Administered 2021-05-26 – 2021-05-31 (×8): 10 mg via ORAL
  Filled 2021-05-26 (×15): qty 1

## 2021-05-26 MED ORDER — HYDROXYZINE HCL 25 MG PO TABS
25.0000 mg | ORAL_TABLET | Freq: Three times a day (TID) | ORAL | Status: DC | PRN
  Administered 2021-05-27 – 2021-05-31 (×3): 25 mg via ORAL
  Filled 2021-05-26 (×3): qty 1

## 2021-05-26 MED ORDER — CLONAZEPAM 0.5 MG PO TABS
0.5000 mg | ORAL_TABLET | Freq: Once | ORAL | Status: AC
  Administered 2021-05-26: 0.5 mg via ORAL
  Filled 2021-05-26: qty 1

## 2021-05-26 MED ORDER — DULOXETINE HCL 20 MG PO CPEP
40.0000 mg | ORAL_CAPSULE | Freq: Every day | ORAL | Status: DC
Start: 2021-05-26 — End: 2021-05-27
  Administered 2021-05-26: 40 mg via ORAL
  Filled 2021-05-26 (×3): qty 2

## 2021-05-26 MED ORDER — CLONAZEPAM 0.5 MG PO TABS
0.5000 mg | ORAL_TABLET | Freq: Two times a day (BID) | ORAL | Status: DC
  Administered 2021-05-26 – 2021-06-01 (×12): 0.5 mg via ORAL
  Filled 2021-05-26 (×12): qty 1

## 2021-05-26 MED ORDER — TRAZODONE HCL 50 MG PO TABS
50.0000 mg | ORAL_TABLET | Freq: Every evening | ORAL | Status: DC | PRN
  Administered 2021-05-26 – 2021-05-30 (×5): 50 mg via ORAL
  Filled 2021-05-26 (×5): qty 1

## 2021-05-26 MED ORDER — POTASSIUM CHLORIDE CRYS ER 20 MEQ PO TBCR
20.0000 meq | EXTENDED_RELEASE_TABLET | Freq: Two times a day (BID) | ORAL | Status: AC
  Administered 2021-05-26 – 2021-05-27 (×2): 20 meq via ORAL
  Filled 2021-05-26 (×3): qty 1
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
## 2021-05-26 NOTE — Progress Notes (Signed)
Nutrition Brief Note ? ?Patient identified on the Malnutrition Screening Tool (MST) Report ? ?Patient reports 30 lbs of  weight loss over the past year. Per weight records, pt has lost 22 lbs over the past year which is insignificant for time frame. ?Supplements not warranted at this time. ? ?Wt Readings from Last 15 Encounters:  ?05/25/21 117.5 kg  ?04/25/21 117.5 kg  ?04/22/20 127.5 kg  ?03/12/19 130.2 kg  ?03/08/18 121.7 kg  ?03/07/17 108 kg  ?02/15/16 119.7 kg  ?04/09/15 115.7 kg  ?04/07/15 115.8 kg  ?02/17/15 120 kg  ?01/22/15 122 kg  ?12/31/13 131.5 kg  ?08/20/12 127.5 kg  ?01/13/11 129.7 kg  ? ? ?Body mass index is 41.8 kg/m?Marland Kitchen Patient meets criteria for morbid obesity based on current BMI.  ? ?Current diet order is regular. Labs and medications reviewed.  ? ?No nutrition interventions warranted at this time. If nutrition issues arise, please consult RD.  ? ? ?Clayton Bibles, MS, RD, LDN ?Inpatient Clinical Dietitian ?Contact information available via Amion ? ? ?
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
## 2021-05-26 NOTE — Progress Notes (Signed)
?   05/26/21 2030  ?Psych Admission Type (Psych Patients Only)  ?Admission Status Voluntary  ?Psychosocial Assessment  ?Patient Complaints Anxiety;Depression  ?Eye Contact Brief  ?Facial Expression Anxious  ?Affect Anxious  ?Speech Logical/coherent  ?Interaction Assertive  ?Motor Activity Other (Comment) ?(wnl)  ?Appearance/Hygiene Unremarkable  ?Behavior Characteristics Cooperative;Anxious  ?Mood Depressed;Anxious;Pleasant  ?Thought Process  ?Coherency WDL  ?Content WDL  ?Delusions None reported or observed  ?Perception WDL  ?Hallucination None reported or observed  ?Judgment Poor  ?Confusion None  ?Danger to Self  ?Current suicidal ideation? Denies  ?Danger to Others  ?Danger to Others None reported or observed  ? ?Pt seen at nurse's station. Pt seems calmer this evening. Pt denies SI, HI, AVH and pain. Pt rates anxiety 5/10 and depression 7/10. Pt is not ruminating as much on the events at her job. Pt is interacting with peers on the unit. Pt's husband came to visit today. Pt also excited that her daughter received her learner's permit yesterday and drove the car today.  ?
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
## 2021-05-27 ENCOUNTER — Encounter (HOSPITAL_COMMUNITY): Payer: Self-pay

## 2021-05-27 LAB — BASIC METABOLIC PANEL
Anion gap: 7 (ref 5–15)
BUN: 12 mg/dL (ref 6–20)
CO2: 25 mmol/L (ref 22–32)
Calcium: 8.8 mg/dL — ABNORMAL LOW (ref 8.9–10.3)
Chloride: 106 mmol/L (ref 98–111)
Creatinine, Ser: 1 mg/dL (ref 0.44–1.00)
GFR, Estimated: >60 mL/min (ref >60)
Glucose, Bld: 72 mg/dL (ref 70–99)
Potassium: 4.1 mmol/L (ref 3.5–5.1)
Sodium: 138 mmol/L (ref 135–145)

## 2021-05-27 MED ORDER — DULOXETINE HCL 20 MG PO CPEP
40.0000 mg | ORAL_CAPSULE | Freq: Every day | ORAL | Status: DC
  Administered 2021-05-28 – 2021-05-30 (×3): 40 mg via ORAL
  Filled 2021-05-27 (×6): qty 2

## 2021-05-27 NOTE — BH IP Treatment Plan (Signed)
Interdisciplinary Treatment and Diagnostic Plan Update ? ?05/27/2021 ?Time of Session: 11:00am ?Rebekah Silva ?MRN: [id number] ? ?Principal Diagnosis: MDD (major depressive disorder), recurrent severe, without psychosis ([place]) ? ?Secondary Diagnoses: Principal Problem: ?  MDD (major depressive disorder), recurrent severe, without psychosis ([place]) ? ? ?Current Medications:  ?Current Facility-Administered Medications  ?Medication Dose Route Frequency Provider Last Rate Last Admin  ? atorvastatin (LIPITOR) tablet 20 mg  20 mg Oral QHS Margorie John W, PA-C   20 mg at 05/26/21 2104  ? buPROPion (WELLBUTRIN XL) 24 hr tablet 150 mg  150 mg Oral q morning Prescilla Sours, PA-C   150 mg at 05/27/21 1001  ? calcium-vitamin D (OSCAL WITH D) 500-5 MG-MCG per tablet 1 tablet  1 tablet Oral QHS Prescilla Sours, PA-C   1 tablet at 05/26/21 2105  ? clonazePAM (KLONOPIN) tablet 0.5 mg  0.5 mg Oral Q12H Massengill, Nathan, MD   0.5 mg at 05/27/21 6759  ? DULoxetine (CYMBALTA) DR capsule 40 mg  40 mg Oral QHS Massengill, Ovid Curd, MD   40 mg at 05/26/21 2103  ? hydrOXYzine (ATARAX) tablet 25 mg  25 mg Oral TID PRN Massengill, Ovid Curd, MD      ? latanoprost (XALATAN) 0.005 % ophthalmic solution 1 drop  1 drop Both Eyes QHS Margorie John W, PA-C   1 drop at 05/26/21 2103  ? loratadine (CLARITIN) tablet 10 mg  10 mg Oral Daily Prescilla Sours, PA-C   10 mg at 05/27/21 1638  ? mesalamine (LIALDA) EC tablet 4.8 g  4.8 g Oral Daily Margorie John W, PA-C   4.8 g at 05/27/21 4665  ? multivitamin with minerals tablet 1 tablet  1 tablet Oral Daily Prescilla Sours, PA-C   1 tablet at 05/27/21 9935  ? propranolol (INDERAL) tablet 10 mg  10 mg Oral BID Janine Limbo, MD   10 mg at 05/27/21 7017  ? SUMAtriptan (IMITREX) tablet 100 mg  100 mg Oral Q2H PRN Margorie John W, PA-C      ? topiramate (TOPAMAX) tablet 100 mg  100 mg Oral QHS Margorie John W, PA-C   100 mg at 05/26/21 2104  ? traZODone (DESYREL) tablet 50 mg  50 mg Oral QHS PRN Janine Limbo, MD   50 mg at 05/26/21 2104  ? ?PTA Medications: ?Facility-Administered Medications Prior to Admission  ?Medication Dose Route Frequency Provider Last Rate Last Admin  ? levonorgestrel (MIRENA) 20 MCG/24HR IUD   Intrauterine Once Terrance Mass, MD      ? ?Medications Prior to Admission  ?Medication Sig Dispense Refill Last Dose  ? atorvastatin (LIPITOR) 20 MG tablet Take 20 mg by mouth at bedtime.     ? buPROPion (WELLBUTRIN XL) 150 MG 24 hr tablet Take 150 mg by mouth every morning.     ? Calcium Carbonate-Vitamin D 600-400 MG-UNIT tablet Take 1 tablet by mouth at bedtime.     ? cetirizine (ZYRTEC) 10 MG tablet Take 10 mg by mouth daily.     ? DULoxetine (CYMBALTA) 30 MG capsule Take 30 mg by mouth at bedtime.     ? latanoprost (XALATAN) 0.005 % ophthalmic solution Place 1 drop into both eyes at bedtime.     ? mesalamine (LIALDA) 1.2 G EC tablet Take 4.8 g by mouth daily.     ? Multiple Vitamin (MULTIVITAMIN WITH MINERALS) TABS tablet Take 1 tablet by mouth daily.     ? promethazine (PHENERGAN) 50 MG suppository Place 50 mg rectally every  6 (six) hours as needed for nausea or vomiting.     ? rizatriptan (MAXALT) 10 MG tablet Take 10 mg by mouth as needed for migraine. May repeat in 2 hours if needed     ? topiramate (TOPAMAX) 100 MG tablet Take 100 mg by mouth at bedtime.  12   ? ? ?Patient Stressors: Occupational concerns   ?Other: traumatic event at her job   ? ?Patient Strengths: Average or above average intelligence  ?Capable of independent living  ?Motivation for treatment/growth  ?Supportive family/friends  ? ?Treatment Modalities: Medication Management, Group therapy, Case management,  ?1 to 1 session with clinician, Psychoeducation, Recreational therapy. ? ? ?Physician Treatment Plan for Primary Diagnosis: MDD (major depressive disorder), recurrent severe, without psychosis ([place]) ?Long Term Goal(s): Improvement in symptoms so as ready for discharge  ? ?Short Term Goals: Ability to identify  changes in lifestyle to reduce recurrence of condition will improve ?Ability to verbalize feelings will improve ?Ability to disclose and discuss suicidal ideas ?Ability to demonstrate self-control will improve ?Ability to identify and develop effective coping behaviors will improve ?Ability to maintain clinical measurements within normal limits will improve ?Compliance with prescribed medications will improve ?Ability to identify triggers associated with substance abuse/mental health issues will improve ? ?Medication Management: Evaluate patient's response, side effects, and tolerance of medication regimen. ? ?Therapeutic Interventions: 1 to 1 sessions, Unit Group sessions and Medication administration. ? ?Evaluation of Outcomes: Not Met ? ?Physician Treatment Plan for Secondary Diagnosis: Principal Problem: ?  MDD (major depressive disorder), recurrent severe, without psychosis ([place]) ? ?Long Term Goal(s): Improvement in symptoms so as ready for discharge  ? ?Short Term Goals: Ability to identify changes in lifestyle to reduce recurrence of condition will improve ?Ability to verbalize feelings will improve ?Ability to disclose and discuss suicidal ideas ?Ability to demonstrate self-control will improve ?Ability to identify and develop effective coping behaviors will improve ?Ability to maintain clinical measurements within normal limits will improve ?Compliance with prescribed medications will improve ?Ability to identify triggers associated with substance abuse/mental health issues will improve    ? ?Medication Management: Evaluate patient's response, side effects, and tolerance of medication regimen. ? ?Therapeutic Interventions: 1 to 1 sessions, Unit Group sessions and Medication administration. ? ?Evaluation of Outcomes: Not Met ? ? ?RN Treatment Plan for Primary Diagnosis: MDD (major depressive disorder), recurrent severe, without psychosis ([place]) ?Long Term Goal(s): Knowledge of disease and therapeutic regimen  to maintain health will improve ? ?Short Term Goals: Ability to remain free from injury will improve, Ability to verbalize frustration and anger appropriately will improve, Ability to demonstrate self-control, Ability to identify and develop effective coping behaviors will improve, and Compliance with prescribed medications will improve ? ?Medication Management: RN will administer medications as ordered by provider, will assess and evaluate patient's response and provide education to patient for prescribed medication. RN will report any adverse and/or side effects to prescribing provider. ? ?Therapeutic Interventions: 1 on 1 counseling sessions, Psychoeducation, Medication administration, Evaluate responses to treatment, Monitor vital signs and CBGs as ordered, Perform/monitor CIWA, COWS, AIMS and Fall Risk screenings as ordered, Perform wound care treatments as ordered. ? ?Evaluation of Outcomes: Not Met ? ? ?LCSW Treatment Plan for Primary Diagnosis: MDD (major depressive disorder), recurrent severe, without psychosis ([place]) ?Long Term Goal(s): Safe transition to appropriate next level of care at discharge, Engage patient in therapeutic group addressing interpersonal concerns. ? ?Short Term Goals: Engage patient in aftercare planning with referrals and resources, Increase social support, Increase ability  to appropriately verbalize feelings, Identify triggers associated with mental health/substance abuse issues, and Increase skills for wellness and recovery ? ?Therapeutic Interventions: Assess for all discharge needs, 1 to 1 time with Education officer, museum, Explore available resources and support systems, Assess for adequacy in community support network, Educate family and significant other(s) on suicide prevention, Complete Psychosocial Assessment, Interpersonal group therapy. ? ?Evaluation of Outcomes: Not Met ? ? ?Progress in Treatment: ?Attending groups: Yes. ?Participating in groups: Yes. ?Taking medication as  prescribed: Yes. ?Toleration medication: Yes. ?Family/Significant other contact made: Yes, individual(s) contacted:  husband ?Patient understands diagnosis: Yes. ?Discussing patient identified problems/goals with staff

## 2021-05-27 NOTE — Group Note (Unsigned)
Date:  05/27/2021 ?Time:  9:48 AM ? ?Group Topic/Focus:  ?Orientation:   The focus of this group is to educate the patient on the purpose and policies of crisis stabilization and provide a format to answer questions about their admission.  The group details unit policies and expectations of patients while admitted. ? ? ? ? ?Participation Level:  {BHH PARTICIPATION LEVEL:22264} ? ?Participation Quality:  {BHH PARTICIPATION QUALITY:22265} ? ?Affect:  {BHH AFFECT:22266} ? ?Cognitive:  {BHH COGNITIVE:22267} ? ?Insight: {BHH Insight2:20797} ? ?Engagement in Group:  {BHH ENGAGEMENT IN XIVHS:92909} ? ?Modes of Intervention:  {BHH MODES OF INTERVENTION:22269} ? ?Additional Comments:  *** ? ?Garvin Fila ?05/27/2021, 9:48 AM ? ?

## 2021-05-27 NOTE — BHH Suicide Risk Assessment (Signed)
BHH INPATIENT:  Family/Significant Other Suicide Prevention Education ? ?Suicide Prevention Education:  ?Education Completed; Rebekah Silva [phone] (Husband) has been identified by the patient as the family member/significant other with whom the patient will be residing, and identified as the person(s) who will aid the patient in the event of a mental health crisis (suicidal ideations/suicide attempt).  With written consent from the patient, the family member/significant other has been provided the following suicide prevention education, prior to the and/or following the discharge of the patient. ? ?The suicide prevention education provided includes the following: ?Suicide risk factors ?Suicide prevention and interventions ?National Suicide Hotline telephone number ?[facility] assessment telephone number ?[facility] Emergency Assistance 911 ?[place] and/or Residential Mobile Crisis Unit telephone number ? ?Request made of family/significant other to: ?Remove weapons (e.g., guns, rifles, knives), all items previously/currently identified as safety concern.   ?Remove drugs/medications (over-the-counter, prescriptions, illicit drugs), all items previously/currently identified as a safety concern. ? ?The family member/significant other verbalizes understanding of the suicide prevention education information provided.  The family member/significant other agrees to remove the items of safety concern listed above. ? 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  He states that his wife was upset about this incident and the Principals actions and since then has been crying, unable to sleep, and called out of work one day stating "I would rather drive into a tree then to come into that school".  He states that this comment led to her hospitalization.  He states that his wife has  experienced depressive symptoms in the past but was taking her medications and going to therapy regularly.  He states that the stress from this incident was to overwhelming for her.  Rebekah Silva states that he spoke with his wife while she has been in the hospital and feels that she is starting to improve.  He states that he will be coming to visit her this evening.  Rebekah Silva confirms that his wife can come home after discharge and that there are no firearms in the home.  CSW completed SPE with Rebekah Silva.  ? ?Rebekah Silva ?05/27/2021, 12:38 PM ?

## 2021-05-27 NOTE — Progress Notes (Signed)
?   05/27/21 0830  ?Psych Admission Type (Psych Patients Only)  ?Admission Status Voluntary  ?Psychosocial Assessment  ?Patient Complaints Anxiety;Depression  ?Eye Contact Fair  ?Facial Expression Anxious  ?Affect Sad  ?Speech Logical/coherent  ?Interaction Assertive  ?Motor Activity Other (Comment) ?(WDL)  ?Appearance/Hygiene Unremarkable  ?Behavior Characteristics Cooperative;Appropriate to situation  ?Mood Anxious;Pleasant  ?Thought Process  ?Coherency WDL  ?Content WDL  ?Delusions None reported or observed  ?Perception WDL  ?Hallucination None reported or observed  ?Judgment Poor  ?Confusion None  ?Danger to Self  ?Current suicidal ideation? Denies  ?Danger to Others  ?Danger to Others None reported or observed  ? ? ?

## 2021-05-27 NOTE — Group Note (Signed)
LCSW Group Therapy Note ? ? ?Group Date: 05/27/2021 ?Start Time: 1300 ?End Time: 1400 ? ?Type of Therapy and Topic:  Group Therapy:  Healthy and Unhealthy Supports ? ?Participation Level:  Active  ? ?Description of Group:  Patients in this group were introduced to the idea of adding a variety of healthy supports to address the various needs in their lives, especially in reference to their plans and focus for the new year.  Patients discussed what additional healthy supports could be helpful in their recovery and wellness after discharge in order to prevent future hospitalizations.   An emphasis was placed on using counselor, doctor, therapy groups, 12-step groups, and problem-specific support groups to expand supports.   ? ?Therapeutic Goals: ? ? 1)  discuss importance of adding supports to stay well once out of the hospital ? 2)  compare healthy versus unhealthy supports and identify some examples of each ? 3)  generate ideas and descriptions of healthy supports that can be added ? 4)  offer mutual support about how to address unhealthy supports ? 5)  encourage active participation in and adherence to discharge plan ?  ? ?Summary of Patient Progress:  The patient attended group and remained there the entire time.  The Pt followed along with the discussion and was appropriate with their peers.  The Pt demonstrated understanding of the topic being discussed.  The Pt was able to find at least 1 support person or system in their life right now.  ? ?[name], LCSWA ?05/27/2021  2:02 PM   ? ?

## 2021-05-27 NOTE — Group Note (Unsigned)
Date:  05/27/2021 ?Time:  9:57 AM ? ?Group Topic/Focus:  ?Orientation:   The focus of this group is to educate the patient on the purpose and policies of crisis stabilization and provide a format to answer questions about their admission.  The group details unit policies and expectations of patients while admitted. ? ? ? ? ?Participation Level:  {BHH PARTICIPATION LEVEL:22264} ? ?Participation Quality:  {BHH PARTICIPATION QUALITY:22265} ? ?Affect:  {BHH AFFECT:22266} ? ?Cognitive:  {BHH COGNITIVE:22267} ? ?Insight: {BHH Insight2:20797} ? ?Engagement in Group:  {BHH ENGAGEMENT IN OFHQR:97588} ? ?Modes of Intervention:  {BHH MODES OF INTERVENTION:22269} ? ?Additional Comments:  *** ? ?Rebekah Silva ?05/27/2021, 9:57 AM ? ?

## 2021-05-27 NOTE — Group Note (Signed)
Recreation Therapy Group Note ? ? ?Group Topic:Stress Management  ?Group Date: 05/27/2021 ?Start Time: 8 ?End Time: 0950 ?Facilitators: Victorino Sparrow, LRT,CTRS ?Location: [place] ? ? ?Goal Area(s) Addresses:  ?Patient will identify positive stress management techniques. ?Patient will identify benefits of using stress management post d/c. ? ?Group Description:  Meditation.  LRT played a meditation that focused on having gratitude for what you have and what you have to offer to the world instead of focusing on the things you lack.  Patients were to listen and follow along as meditation played to engage in the activity. ? ? ?Affect/Mood: N/A ?  ?Participation Level: Did not attend ?  ? ?Clinical Observations/Individualized Feedback:   ? ? ?Plan: Continue to engage patient in RT group sessions 2-3x/week. ? ? ?Victorino Sparrow, LRT,CTRS ?05/27/2021 10:39 AM ?

## 2021-05-27 NOTE — Progress Notes (Signed)
[facility] MD Progress Note ? ?05/27/2021 3:20 PM ?Tracie Harrier Screws  ?MRN:  [id number] ? ?Subjective:  "I am feeling more even today. I'm tying not to dwell on the thought of the school incident at this time." ? ?Brief History:  ? Yasmen Cortner is a 51 year old Caucasian female admitted to [facility] from [facility] with diagnoses MDD (major depressive disorder), recurrent severe, without psychosis ([place]) [F33.2], Hypercholesteremia, Steroid long term use, Ulcerative Colitis, Migraine Headache, IUD in place. 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. ? ?Daily Notes 05/27/21: Patient is seen and evaluated in her room sitting on her bed. She is awake alert and oriented to person, place, time and situation. Able to maintain good eye contact during the encounter and answered questions appropriately. Speech is clear, coherent and with normal pattern and volume. Mood is anxious and depressed and affect is coherent. Thought process is coherent and goal directed with thought content  logical and within normal limit. Actively participating in therapeutic milieu and group activities. ? ?Patient reported that she is not spiraling as she did yesterday, but more even out with her feelings regarding the school incident. Reported, "My mood is much brighter today." Endorsed sleeping up to 9 hours last night and consuming about 40 to 50 % of her meals. Rated Anxiety as "5" and depression as "5" on a scale of 0 to 10. Continues in her prescribed medications without reported adverse effects. Will continue to assess for safety, stabilization, and therapeutic medication effects. Abnormal labs of 05/25/21 reviewed CMP: K+ level of 3.3, Potassium chloride SA (KLOR-CON M) CR tablet 20 mEq was replenished yesterday, awaiting new K+ level at this time. Lipid Profile: CHOL was 220, LDL - 139, patient continues on atorvastatin 20 mg po daily at bed time. CBC with diff: WBC  - 12.1, neutrophils elevated at 8.5, however, patient has no signs of infection, RDW - 16.6 and PLT - 425. Urinalysis with appearance Hazy and ketones 15, however, Nitrite is negative. Urine Toxicology is positive for marijuana. ? ?Principal Problem: MDD (major depressive disorder), recurrent severe, without psychosis ([place]) ? ?Diagnosis: Principal Problem: ?  MDD (major depressive disorder), recurrent severe, without psychosis ([place]) ? ?Total Time spent with patient: 30 minutes ? ?Past Psychiatric History: MDD (major depressive disorder), severe without psychosis. ?  ? ?Past Medical History:  ?Past Medical History:  ?Diagnosis Date  ? Arthritis   ? ra  ? Colitis   ? GERD (gastroesophageal reflux disease)   ? Hypercholesterolemia   ? DR. WHITE  ? Migraines   ? PIH (pregnancy induced hypertension) yrs ago, none since  ? PONV (postoperative nausea and vomiting)   ? Wears glasses   ?  ?Past Surgical History:  ?Procedure Laterality Date  ? ANAL FISSURE REPAIR N/A 04/09/2015  ? Procedure: ANAL CHEMICAL DENERVATION(BOTOX);  Surgeon: Ralene Ok, MD;  Location: [place];  Service: General;  Laterality: N/A;  ? ANAL FISSURE REPAIR  07/06/2016  ? CATARACT EXTRACTION Left   ? CESAREAN SECTION  2007  ? COLONOSCOPY    ? INTRAUTERINE DEVICE INSERTION  12/15/2005  ? MOUTH SURGERY    ? RECTAL EXAM UNDER ANESTHESIA N/A 02/17/2015  ? Procedure: RECTAL EXAM UNDER ANESTHESIA;  Surgeon: Ralene Ok, MD;  Location: [facility];  Service: General;  Laterality: N/A;  ? RETINAL DETACHMENT SURGERY  2019  ? X 5   ? SPHINCTEROTOMY N/A 02/17/2015  ?  Procedure: LATERAL INTERNAL SPHINCTEROTOMY;  Surgeon: Ralene Ok, MD;  Location: [facility];  Service: General;  Laterality: N/A;  ? ?Family History:  ?Family History  ?Problem Relation Age of Onset  ? Hypertension Father   ? Breast cancer Mother   ?     mastectomy- all through the ducts of left breast   ? Diabetes Paternal Grandmother   ? Heart disease Paternal Grandmother   ? Cancer Maternal  Grandfather   ?     colon?  ? Breast cancer Maternal Aunt   ? ?Family Psychiatric  History:  Mother has has diagnosis of panic attack and depression ?  ? ?Social History:  ?Social History  ? ?Substance and Sexual Activity  ?Alcohol Use Yes  ? Alcohol/week: 0.0 standard drinks  ? Comment: occ  ?   ?Social History  ? ?Substance and Sexual Activity  ?Drug Use Yes  ? Types: Marijuana  ?  ?Social History  ? ?Socioeconomic History  ? Marital status: Married  ?  Spouse name: Not on file  ? Number of children: Not on file  ? Years of education: Not on file  ? Highest education level: Not on file  ?Occupational History  ? Not on file  ?Tobacco Use  ? Smoking status: Never  ? Smokeless tobacco: Never  ?Vaping Use  ? Vaping Use: Never used  ?Substance and Sexual Activity  ? Alcohol use: Yes  ?  Alcohol/week: 0.0 standard drinks  ?  Comment: occ  ? Drug use: Yes  ?  Types: Marijuana  ? Sexual activity: Yes  ?  Partners: Male  ?  Birth control/protection: I.U.D.  ?  Comment: 1st intercourse- 65, partners- 88, married- 6 yrs   ?Other Topics Concern  ? Not on file  ?Social History Narrative  ? Not on file  ? ?Social Determinants of Health  ? ?Financial Resource Strain: Not on file  ?Food Insecurity: Not on file  ?Transportation Needs: Not on file  ?Physical Activity: Not on file  ?Stress: Not on file  ?Social Connections: Not on file  ? ?Additional Social History:  ?  ?Sleep: Good ? ?Appetite:  Good ? ?Current Medications: ?Current Facility-Administered Medications  ?Medication Dose Route Frequency Provider Last Rate Last Admin  ? atorvastatin (LIPITOR) tablet 20 mg  20 mg Oral QHS Margorie John W, PA-C   20 mg at 05/26/21 2104  ? buPROPion (WELLBUTRIN XL) 24 hr tablet 150 mg  150 mg Oral q morning Prescilla Sours, PA-C   150 mg at 05/27/21 1001  ? calcium-vitamin D (OSCAL WITH D) 500-5 MG-MCG per tablet 1 tablet  1 tablet Oral QHS Prescilla Sours, PA-C   1 tablet at 05/26/21 2105  ? clonazePAM (KLONOPIN) tablet 0.5 mg  0.5 mg Oral  Q12H Massengill, Nathan, MD   0.5 mg at 05/27/21 6734  ? DULoxetine (CYMBALTA) DR capsule 40 mg  40 mg Oral QHS Massengill, Ovid Curd, MD   40 mg at 05/26/21 2103  ? hydrOXYzine (ATARAX) tablet 25 mg  25 mg Oral TID PRN Massengill, Ovid Curd, MD      ? latanoprost (XALATAN) 0.005 % ophthalmic solution 1 drop  1 drop Both Eyes QHS Margorie John W, PA-C   1 drop at 05/26/21 2103  ? loratadine (CLARITIN) tablet 10 mg  10 mg Oral Daily Prescilla Sours, PA-C   10 mg at 05/27/21 1937  ? mesalamine (LIALDA) EC tablet 4.8 g  4.8 g Oral Daily Margorie John W, PA-C   4.8 g  at 05/27/21 1898  ? multivitamin with minerals tablet 1 tablet  1 tablet Oral Daily Prescilla Sours, PA-C   1 tablet at 05/27/21 4210  ? propranolol (INDERAL) tablet 10 mg  10 mg Oral BID Janine Limbo, MD   10 mg at 05/27/21 3128  ? SUMAtriptan (IMITREX) tablet 100 mg  100 mg Oral Q2H PRN Margorie John W, PA-C      ? topiramate (TOPAMAX) tablet 100 mg  100 mg Oral QHS Margorie John W, PA-C   100 mg at 05/26/21 2104  ? traZODone (DESYREL) tablet 50 mg  50 mg Oral QHS PRN Janine Limbo, MD   50 mg at 05/26/21 2104  ? ? ?Lab Results:  ?Results for orders placed or performed during the hospital encounter of 05/25/21 (from the past 48 hour(s))  ?Urine rapid drug screen (hosp performed)not at [facility]     Status: Abnormal  ? Collection Time: 05/26/21 10:50 AM  ?Result Value Ref Range  ? Opiates NONE DETECTED NONE DETECTED  ? Cocaine NONE DETECTED NONE DETECTED  ? Benzodiazepines NONE DETECTED NONE DETECTED  ? Amphetamines NONE DETECTED NONE DETECTED  ? Tetrahydrocannabinol POSITIVE (A) NONE DETECTED  ? Barbiturates NONE DETECTED NONE DETECTED  ?  Comment: (NOTE) ?DRUG SCREEN FOR MEDICAL PURPOSES ?ONLY.  IF CONFIRMATION IS NEEDED ?FOR ANY PURPOSE, NOTIFY LAB ?WITHIN 5 DAYS. ? ?LOWEST DETECTABLE LIMITS ?FOR URINE DRUG SCREEN ?Drug Class                     Cutoff (ng/mL) ?Amphetamine and metabolites    1000 ?Barbiturate and metabolites    200 ?Benzodiazepine                  200 ?Tricyclics and metabolites     300 ?Opiates and metabolites        300 ?Cocaine and metabolites        300 ?THC                            50 ?Performed at [facility] [place]

## 2021-05-28 NOTE — Progress Notes (Signed)
Adult Psychoeducational Group Note ? ?Date:  05/28/2021 ?Time:  10:11 PM ? ?Group Topic/Focus:  ?Wrap-Up Group:   The focus of this group is to help patients review their daily goal of treatment and discuss progress on daily workbooks. ? ?Participation Level:  Active ? ?Participation Quality:  Appropriate ? ?Affect:  Appropriate ? ?Cognitive:  Appropriate ? ?Insight: Appropriate ? ?Engagement in Group:  Engaged ? ?Modes of Intervention:  Education and Exploration ? ?Additional Comments:  Patient attended and participated in group tonight.she learn that she dont need to feel guilty or ashame for being here at [place] ? ?Debe Coder ?05/28/2021, 10:11 PM ?

## 2021-05-28 NOTE — Progress Notes (Signed)
[facility] MD Progress Note ? ?05/28/2021 3:55 PM ?Rebekah Silva  ?MRN:  [id number] ? ?Subjective:  "I feel better because I am not dwelling on the thoughts that will pull my mood down." ? ?Brief History:  ? 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. ? ?Daily Notes 05/28/21: Patient is seen and evaluated in her room sitting on her bed. She is awake alert and oriented to person, place, time and situation. Able to maintain good eye contact during the encounter and answered questions appropriately. Speech is clear, coherent and with normal pattern and volume. Mood is anxious and depressed and affect is appropriate and coherent. Thought process is coherent and goal directed with thought content  logical and within normal limit. Actively participating in therapeutic milieu and group activities. ? ?Patient reported that she is much happier today and showing off her daughter's pictures and her husband.  Most brighter mood today." Endorsed sleeping up to 6 hours last night and consuming about 60 % of her meals. Rated Anxiety as "4" and depression as "5" on a scale of 0 to 10. Continues in her prescribed medications without reported adverse effects. Will continue to assess for safety, stabilization, and therapeutic medication effects. Abnormal labs of 05/25/21 reviewed CMP: K+ level of 3.3, Potassium chloride SA (KLOR-CON M) CR tablet 20 mEq was replenished , and K+ on 05/27/21 is within normal limit of 4.1 and Anion gap is 8.8. Lipid Profile: CHOL was 220, LDL - 139, patient continues on atorvastatin 20 mg po daily at bed time. CBC with diff: WBC - 12.1, neutrophils  elevated at 8.5, however, patient has no signs of infection, RDW - 16.6 and PLT - 425. Urinalysis with appearance Hazy and ketones 15, however, Nitrite is negative. Urine Toxicology is positive for marijuana. No changes in medication orders today. ? ?Principal Problem: MDD (major depressive disorder), recurrent severe, without psychosis ([place]) ? ?Diagnosis: Principal Problem: ?  MDD (major depressive disorder), recurrent severe, without psychosis ([place]) ? ?Total Time spent with patient: 30 minutes ? ?Past Psychiatric History: MDD (major depressive disorder), severe without psychosis. ?  ? ?Past Medical History:  ?Past Medical History:  ?Diagnosis Date  ? Arthritis   ? ra  ? Colitis   ? GERD (gastroesophageal reflux disease)   ? Hypercholesterolemia   ? DR. WHITE  ? Migraines   ? PIH (pregnancy induced hypertension) yrs ago, none since  ? PONV (postoperative nausea and vomiting)   ? Wears glasses   ?  ?Past Surgical History:  ?Procedure Laterality Date  ? ANAL FISSURE REPAIR N/A 04/09/2015  ? Procedure: ANAL CHEMICAL DENERVATION(BOTOX);  Surgeon: Ralene Ok, MD;  Location: [address];  Service: General;  Laterality: N/A;  ? ANAL FISSURE REPAIR  07/06/2016  ? CATARACT EXTRACTION Left   ? CESAREAN SECTION  2007  ? COLONOSCOPY    ? INTRAUTERINE DEVICE INSERTION  12/15/2005  ? MOUTH SURGERY    ? RECTAL EXAM UNDER ANESTHESIA N/A 02/17/2015  ? Procedure: RECTAL EXAM UNDER ANESTHESIA;  Surgeon: Ralene Ok, MD;  Location: [facility];  Service: General;  Laterality: N/A;  ? RETINAL DETACHMENT SURGERY  2019  ? X 5   ? SPHINCTEROTOMY N/A  02/17/2015  ? Procedure: LATERAL INTERNAL SPHINCTEROTOMY;  Surgeon: Ralene Ok, MD;  Location: [facility];  Service: General;  Laterality: N/A;  ? ?Family History:  ?Family History  ?Problem Relation Age of Onset  ? Hypertension Father   ? Breast cancer Mother   ?     mastectomy- all through the ducts of left breast   ? Diabetes Paternal Grandmother   ? Heart disease Paternal Grandmother   ?  Cancer Maternal Grandfather   ?     colon?  ? Breast cancer Maternal Aunt   ? ?Family Psychiatric  History:  Mother has has diagnosis of panic attack and depression ?  ? ?Social History:  ?Social History  ? ?Substance and Sexual Activity  ?Alcohol Use Yes  ? Alcohol/week: 0.0 standard drinks  ? Comment: occ  ?   ?Social History  ? ?Substance and Sexual Activity  ?Drug Use Yes  ? Types: Marijuana  ?  ?Social History  ? ?Socioeconomic History  ? Marital status: Married  ?  Spouse name: Not on file  ? Number of children: Not on file  ? Years of education: Not on file  ? Highest education level: Not on file  ?Occupational History  ? Not on file  ?Tobacco Use  ? Smoking status: Never  ? Smokeless tobacco: Never  ?Vaping Use  ? Vaping Use: Never used  ?Substance and Sexual Activity  ? Alcohol use: Yes  ?  Alcohol/week: 0.0 standard drinks  ?  Comment: occ  ? Drug use: Yes  ?  Types: Marijuana  ? Sexual activity: Yes  ?  Partners: Male  ?  Birth control/protection: I.U.D.  ?  Comment: 1st intercourse- 69, partners- 52, married- 57 yrs   ?Other Topics Concern  ? Not on file  ?Social History Narrative  ? Not on file  ? ?Social Determinants of Health  ? ?Financial Resource Strain: Not on file  ?Food Insecurity: Not on file  ?Transportation Needs: Not on file  ?Physical Activity: Not on file  ?Stress: Not on file  ?Social Connections: Not on file  ? ?Additional Social History:  ?  ?Sleep: Good ? ?Appetite:  Good ? ?Current Medications: ?Current Facility-Administered Medications  ?Medication Dose Route Frequency Provider Last Rate Last Admin  ? atorvastatin (LIPITOR) tablet 20 mg  20 mg Oral QHS Margorie John W, PA-C   20 mg at 05/27/21 2047  ? buPROPion (WELLBUTRIN XL) 24 hr tablet 150 mg  150 mg Oral q morning Prescilla Sours, PA-C   150 mg at 05/28/21 0757  ? calcium-vitamin D (OSCAL WITH D) 500-5 MG-MCG per tablet 1 tablet  1 tablet Oral QHS Prescilla Sours, PA-C   1 tablet at 05/27/21 2047  ? clonazePAM (KLONOPIN) tablet 0.5  mg  0.5 mg Oral Q12H Massengill, Nathan, MD   0.5 mg at 05/28/21 0754  ? DULoxetine (CYMBALTA) DR capsule 40 mg  40 mg Oral Daily Massengill, Nathan, MD   40 mg at 05/28/21 0754  ? hydrOXYzine (ATARAX) tablet 25 mg  25 mg Oral TID PRN Janine Limbo, MD   25 mg at 05/27/21 2211  ? latanoprost (XALATAN) 0.005 % ophthalmic solution 1 drop  1 drop Both Eyes QHS Prescilla Sours, PA-C   1 drop at 05/27/21 2053  ? loratadine (CLARITIN) tablet 10 mg  10 mg Oral Daily Margorie John W, PA-C   10 mg at 05/28/21 0754  ? mesalamine (LIALDA) EC tablet 4.8 g  4.8 g Oral Daily Margorie John  W, PA-C   4.8 g at 05/28/21 7782  ? multivitamin with minerals tablet 1 tablet  1 tablet Oral Daily Prescilla Sours, PA-C   1 tablet at 05/28/21 4235  ? propranolol (INDERAL) tablet 10 mg  10 mg Oral BID Janine Limbo, MD   10 mg at 05/28/21 0754  ? SUMAtriptan (IMITREX) tablet 100 mg  100 mg Oral Q2H PRN Margorie John W, PA-C      ? topiramate (TOPAMAX) tablet 100 mg  100 mg Oral QHS Margorie John W, PA-C   100 mg at 05/27/21 2047  ? traZODone (DESYREL) tablet 50 mg  50 mg Oral QHS PRN Janine Limbo, MD   50 mg at 05/27/21 2211  ? ? ?Lab Results:  ?Results for orders placed or performed during the hospital encounter of 05/25/21 (from the past 48 hour(s))  ?Basic metabolic panel     Status: Abnormal  ? Collection Time: 05/27/21  6:32 PM  ?Result Value Ref Range  ? Sodium 138 135 - 145 mmol/L  ? Potassium 4.1 3.5 - 5.1 mmol/L  ? Chloride 106 98 - 111 mmol/L  ? CO2 25 22 - 32 mmol/L  ? Glucose, Bld 72 70 - 99 mg/dL  ?  Comment: Glucose reference range applies only to samples taken after fasting for at least 8 hours.  ? BUN 12 6 - 20 mg/dL  ? Creatinine, Ser 1.00 0.44 - 1.00 mg/dL  ? Calcium 8.8 (L) 8.9 - 10.3 mg/dL  ? GFR, Estimated >60 >60 mL/min  ?  Comment: (NOTE) ?Calculated using the CKD-EPI Creatinine Equation (2021) ?  ? Anion gap 7 5 - 15  ?  Comment: Performed at [facility], [place] [address], [place],  [place] [address]  ? ? ?Blood Alcohol level:  ?Lab Results  ?Component Value Date  ? ETH <10 05/25/2021  ? ? ?Metabolic Disorder Labs: ?Lab Results  ?Component Value Date  ? HGBA1C 5.1 05/25/2021  ? MPG 100 05/25/2021

## 2021-05-28 NOTE — Progress Notes (Addendum)
D: Patient reports fair appetite and sleep; her energy level is low to normal. She rates her depression, hopelessness and anxiety as a 4 today. Patient is complaining of pain in her left hip of a 3; she has not asked for any medication for pain. Patient wrote on her self inventory, "please be aware that some of [place] have religious triggers." Patient became upset in SW group this morning because she felt like one of her peers was preaching in the dayroom and halls and she felt "disturbed" by it. Her goal today is to work on "intrusive and obsessive thoughts" because these make her feel "tearful." Patient appears to be interacting well with staff and peers. ? ?A: Continue to monitor medication management and MD orders.  Safety checks completed every 15 minutes per protocol.  Offer support and encouragement as needed. ? ?R: Patient is receptive to staff; her behavior is appropriate.   ? ? 05/28/21 1100  ?Psych Admission Type (Psych Patients Only)  ?Admission Status Voluntary  ?Psychosocial Assessment  ?Patient Complaints Anxiety;Depression  ?Eye Contact Fair  ?Facial Expression Anxious  ?Affect Sad;Anxious  ?Speech Logical/coherent  ?Interaction Assertive  ?Motor Activity Other (Comment) ?(wnl)  ?Appearance/Hygiene Unremarkable  ?Behavior Characteristics Cooperative;Anxious  ?Mood Anxious;Pleasant  ?Thought Process  ?Coherency WDL  ?Content WDL  ?Delusions None reported or observed  ?Perception WDL  ?Hallucination None reported or observed  ?Judgment Limited  ?Confusion None  ?Danger to Self  ?Current suicidal ideation? Denies  ?Danger to Others  ?Danger to Others None reported or observed  ? ? ?

## 2021-05-28 NOTE — Group Note (Signed)
LCSW Group Therapy Note ? ?05/28/2021   10:00-11:00am  ? ?Type of Therapy and Topic:  Group Therapy: Anger Cues and Responses ? ?Participation Level:  Active ? ? ?Description of Group:   ?In this group, patients learned how to recognize the physical, cognitive, emotional, and behavioral responses they have to anger-provoking situations.  They identified a recent time they became angry and how they reacted.  They analyzed how their reaction was possibly beneficial and how it was possibly unhelpful.  The group discussed a variety of healthier coping skills that could help with such a situation in the future.  Focus was placed on how helpful it is to recognize the underlying emotions to our anger, because working on those can lead to a more permanent solution as well as our ability to focus on the important rather than the urgent. ? ?Therapeutic Goals: ?Patients will remember their last incident of anger and how they felt emotionally and physically, what their thoughts were at the time, and how they behaved. ?Patients will identify how their behavior at that time worked for them, as well as how it worked against them. ?Patients will explore possible new behaviors to use in future anger situations. ?Patients will learn that anger itself is normal and cannot be eliminated, and that healthier reactions can assist with resolving conflict rather than worsening situations. ? ?Summary of Patient Progress:  The patient shared that her most recent time of anger was when she felt betrayed and said her underlying feeling is embarrassment.  She takes this out on herself and will become suicidal, states that is exactly how she got here.  She was very attentive for the remainder of group, although she did leave when group was almost over, when another patient started to proselytize. ? ?Therapeutic Modalities:   ?Cognitive Behavioral Therapy ? ?[place] Hun Grossman-Orr   ?

## 2021-05-28 NOTE — Progress Notes (Signed)
?   05/27/21 2050  ?Psych Admission Type (Psych Patients Only)  ?Admission Status Voluntary  ?Psychosocial Assessment  ?Patient Complaints Anxiety  ?Eye Contact Fair  ?Facial Expression Anxious  ?Affect Anxious  ?Speech Logical/coherent  ?Interaction Assertive  ?Motor Activity Other (Comment) ?(WDL)  ?Appearance/Hygiene Unremarkable  ?Behavior Characteristics Cooperative;Anxious  ?Mood Anxious;Pleasant  ?Thought Process  ?Coherency WDL  ?Content WDL  ?Delusions None reported or observed  ?Perception WDL  ?Hallucination None reported or observed  ?Judgment Limited  ?Confusion None  ?Danger to Self  ?Current suicidal ideation? Denies  ?Danger to Others  ?Danger to Others None reported or observed  ? ? ?

## 2021-05-28 NOTE — Progress Notes (Signed)
?   05/28/21 2120  ?Psych Admission Type (Psych Patients Only)  ?Admission Status Voluntary  ?Psychosocial Assessment  ?Patient Complaints Anxiety  ?Eye Contact Fair  ?Facial Expression Anxious  ?Affect Anxious  ?Speech Logical/coherent  ?Interaction Assertive  ?Motor Activity Other (Comment) ?(WDL)  ?Appearance/Hygiene Unremarkable  ?Behavior Characteristics Cooperative;Anxious  ?Mood Anxious;Pleasant  ?Thought Process  ?Coherency WDL  ?Content WDL  ?Delusions None reported or observed  ?Perception WDL  ?Hallucination None reported or observed  ?Judgment Limited  ?Confusion None  ?Danger to Self  ?Current suicidal ideation? Denies  ?Danger to Others  ?Danger to Others None reported or observed  ? ? ?

## 2021-05-29 MED ORDER — WHITE PETROLATUM EX OINT
TOPICAL_OINTMENT | CUTANEOUS | Status: AC
  Filled 2021-05-29: qty 5

## 2021-05-29 MED ORDER — ACETAMINOPHEN 325 MG PO TABS
ORAL_TABLET | ORAL | Status: AC
  Administered 2021-05-29: 650 mg via ORAL
  Filled 2021-05-29: qty 2

## 2021-05-29 MED ORDER — ACETAMINOPHEN 325 MG PO TABS
650.0000 mg | ORAL_TABLET | Freq: Four times a day (QID) | ORAL | Status: DC | PRN
  Administered 2021-05-30: 650 mg via ORAL
  Filled 2021-05-29: qty 2

## 2021-05-29 NOTE — Progress Notes (Addendum)
D: Patient states she is eating/sleeping well. She rates her depression as a 3; her anxiety and hopelessness as a 4. She denies any thoughts of self harm. She does not appear to be responding to internal stimuli. Patient is interacting well with staff and peers. Her goal today is to "be/stay social and remove obsessive thoughts."  ? ?A: Continue to monitor medication management and MD orders.  Safety checks completed every 15 minutes per protocol.  Offer support and encouragement as needed. ? ?R: Patient is receptive to staff; her behavior is appropriate.   ? ? 05/29/21 1000  ?Psych Admission Type (Psych Patients Only)  ?Admission Status Voluntary  ?Psychosocial Assessment  ?Patient Complaints Anxiety  ?Eye Contact Fair  ?Facial Expression Animated  ?Affect Appropriate to circumstance  ?Speech Logical/coherent  ?Interaction Assertive  ?Motor Activity Other (Comment) ?(wnl)  ?Appearance/Hygiene Unremarkable  ?Behavior Characteristics Cooperative;Anxious  ?Mood Anxious;Pleasant  ?Thought Process  ?Coherency WDL  ?Content WDL  ?Delusions None reported or observed  ?Perception WDL  ?Hallucination None reported or observed  ?Judgment Limited  ?Confusion None  ?Danger to Self  ?Current suicidal ideation? Denies  ?Danger to Others  ?Danger to Others None reported or observed  ? ? ?

## 2021-05-29 NOTE — Group Note (Incomplete)
Date:  05/29/2021 ?Time:  10:01 AM ? ?Group Topic/Focus:  ?Goals Group:   The focus of this group is to help patients establish daily goals to achieve during treatment and discuss how the patient can incorporate goal setting into their daily lives to aide in recovery. ? ? ? ?Participation Level:  Active ? ?Participation Quality:  Appropriate ? ?Affect:  Appropriate ? ?Cognitive:  Appropriate ? ?Insight: Appropriate ? ?Engagement in Group:  Engaged ? ?Modes of Intervention:  Discussion ? ?Additional Comments:  Patient wants to work on being more social. Discussed with patient the benefits of being social.  ? ?Rebekah Silva ?05/29/2021, 10:01 AM ? ?

## 2021-05-29 NOTE — Progress Notes (Signed)
?   05/29/21 2315  ?Psych Admission Type (Psych Patients Only)  ?Admission Status Voluntary  ?Psychosocial Assessment  ?Patient Complaints Anxiety  ?Eye Contact Fair  ?Facial Expression Anxious  ?Affect Apprehensive;Anxious  ?Speech Logical/coherent  ?Interaction Assertive  ?Motor Activity Other (Comment) ?(WDL)  ?Appearance/Hygiene Unremarkable  ?Behavior Characteristics Anxious;Appropriate to situation  ?Mood Anxious;Pleasant  ?Thought Process  ?Coherency WDL  ?Content WDL  ?Delusions None reported or observed  ?Perception WDL  ?Hallucination None reported or observed  ?Judgment Limited  ?Confusion None  ?Danger to Self  ?Current suicidal ideation? Denies  ?Self-Injurious Behavior No self-injurious ideation or behavior indicators observed or expressed   ?Danger to Others  ?Danger to Others None reported or observed  ? ? ?

## 2021-05-29 NOTE — Group Note (Signed)
Date:  05/29/2021 ?Time:  10:11 AM ? ?Group Topic/Focus:  ?Goals Group:   The focus of this group is to help patients establish daily goals to achieve during treatment and discuss how the patient can incorporate goal setting into their daily lives to aide in recovery. ? ? ? ?Participation Level:  Active ? ?Participation Quality:  Appropriate ? ?Affect:  Appropriate ? ?Cognitive:  Appropriate ? ?Insight: Appropriate ? ?Engagement in Group:  Engaged ? ?Modes of Intervention:  Discussion ? ?Additional Comments:  Patient wants to work on being more sociable. Discussed with patient the benefits of being social:higher self-esteem, lower anxiety, less depression.   ? ?Jerrye Beavers ?05/29/2021, 10:11 AM ? ?

## 2021-05-29 NOTE — Group Note (Signed)
[place] LCSW Group Therapy Note ? ?Date/Time:  05/29/2021  10:00AM-11:00AM ? ?Type of Therapy and Topic:  Group Therapy:  Music's Effect on Depression and Anxiety ? ?Participation Level:  Active  ? ?Description of Group: ?In this process group, members discussed what types of music trigger them to worsening mental health symptoms and what they can do about this in the future.  For instance, we discussed what to do when riding in a friend's car and a song harmful to the patient starts playing.  We also discussed how music can be used as a tool to help with wellness and recovery in various ways including managing depression and anxiety as well as encouraging healthy sleep habits and avoiding relapse into old negative behaviors such as drinking, self-harming, or staying in bed all day.  A variety of songs were played as examples.  Discussion was elicited which showed the group to be in agreement that the songs were quite relatable and have the potential to help them outside of the hospital setting. ? ?Therapeutic Goals: ?Patients will be introduced to a variety of songs that can relate to self-image and self-love in a helpful way. ?Patients will explore the impact of different pieces of music on their feelings, i.e. uplifting, triggering, etc. ?Patients will discuss how to use this self-knowledge to assist in recovery. ? ?Summary of Patient Progress:  At the beginning of group, patient expressed that "very" Christian music and song about death are triggering for her while Enya and Indie Girls are smoothing.  She participated fully and enjoyed the group.  At the end of group patient reported feeling better. ? ?Therapeutic Modalities: ?Solution Focused Brief Therapy ?Activity ? ? ?Selmer Dominion, LCSW ? ?  ?

## 2021-05-29 NOTE — Progress Notes (Addendum)
Rebekah Silva  ?MRN:  [id number] ? ?Subjective:  "I am making myself more social with other patients, that is who I am." ? ?Brief History:  ? Rebekah Silva is a 51 year old Caucasian female admitted to [facility] from [facility] with diagnoses MDD (major depressive disorder), recurrent severe, without psychosis ([place]) [F33.2], Hypercholesteremia, Steroid long term use, Ulcerative Colitis, Migraine Headache, IUD in place. 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. ? ?Daily Notes 05/29/21: Patient is seen and evaluated in her room sitting on her bed. She is awake alert and oriented to person, place, time and situation. Able to maintain good eye contact during the encounter and answered questions appropriately. Speech is clear, coherent and with normal pattern and volume. Mood is elated and affect is appropriate and coherent. Thought process is coherent and goal directed with thought content  logical and within normal limit. Actively participating in therapeutic milieu and group activities. ? ?Patient reported that she is much happier today and socializing with her peers. Brighter mood today. Endorsed sleeping up to 8 hours last night and consuming about 100 % of her meals. Rated Anxiety as "3" and depression as "5" on a scale of 0 to 10. Denies SI/HI/AVH. However, patient reported she had a fleeting thought of SI in the mornig while thinking what would happen to her after discharge from [facility] and then the thought immediately went away. Continues in her prescribed medications without reported adverse effects. Will continue to assess for safety, stabilization, and therapeutic medication effects. Abnormal labs of 05/25/21 reviewed CMP: K+ level of 3.3, Potassium chloride SA (KLOR-CON M) CR tablet 20 mEq was replenished , and K+ on 05/27/21 is within normal limit of 4.1 and Anion gap is 8.8. Lipid  Profile: CHOL was 220, LDL - 139, patient continues on atorvastatin 20 mg po daily at bed time. CBC with diff: WBC - 12.1, neutrophils elevated at 8.5, however, patient has no signs of infection, RDW - 16.6 and PLT - 425. Urinalysis with appearance Hazy and ketones 15, however, Nitrite is negative. Urine Toxicology remained positive for marijuana. No changes in medication orders today. ? ?Principal Problem: MDD (major depressive disorder), recurrent severe, without psychosis ([place]) ? ?Diagnosis: Principal Problem: ?  MDD (major depressive disorder), recurrent severe, without psychosis ([place]) ? ?Total Time spent with patient: 30 minutes ? ?Past Psychiatric History: MDD (major depressive disorder), severe without psychosis. ?  ?Past Medical History:  ?Past Medical History:  ?Diagnosis Date  ? Arthritis   ? ra  ? Colitis   ? GERD (gastroesophageal reflux disease)   ? Hypercholesterolemia   ? DR. WHITE  ? Migraines   ? PIH (pregnancy induced hypertension) yrs ago, none since  ? PONV (postoperative nausea and vomiting)   ? Wears glasses   ?  ?Past Surgical History:  ?Procedure Laterality Date  ? ANAL FISSURE REPAIR N/A 04/09/2015  ? Procedure: ANAL CHEMICAL DENERVATION(BOTOX);  Surgeon: Ralene Ok, MD;  Location: [place];  Service: General;  Laterality: N/A;  ? ANAL FISSURE REPAIR  07/06/2016  ? CATARACT EXTRACTION Left   ? CESAREAN SECTION  2007  ? COLONOSCOPY    ? INTRAUTERINE DEVICE INSERTION  12/15/2005  ? MOUTH SURGERY    ? RECTAL EXAM UNDER ANESTHESIA N/A 02/17/2015  ? Procedure: RECTAL EXAM UNDER ANESTHESIA;  Surgeon: Ralene Ok, MD;  [facility];  Service: General;  Laterality: N/A;  ? RETINAL DETACHMENT SURGERY  2019  ? X 5   ? SPHINCTEROTOMY N/A 02/17/2015  ? Procedure: LATERAL INTERNAL SPHINCTEROTOMY;  Surgeon: Ralene Ok, MD;  Location: [facility];  Service: General;  Laterality: N/A;  ? ?Family History:  ?Family History  ?Problem Relation Age of Onset  ? Hypertension Father   ? Breast cancer  Mother   ?     mastectomy- all through the ducts of left breast   ? Diabetes Paternal Grandmother   ? Heart disease Paternal Grandmother   ? Cancer Maternal Grandfather   ?     colon?  ? Breast cancer Maternal Aunt   ? ?Family Psychiatric  History:  Mother has has diagnosis of panic attack and depression ?  ? ?Social History:  ?Social History  ? ?Substance and Sexual Activity  ?Alcohol Use Yes  ? Alcohol/week: 0.0 standard drinks  ? Comment: occ  ?   ?Social History  ? ?Substance and Sexual Activity  ?Drug Use Yes  ? Types: Marijuana  ?  ?Social History  ? ?Socioeconomic History  ? Marital status: Married  ?  Spouse name: Not on file  ? Number of children: Not on file  ? Years of education: Not on file  ? Highest education level: Not on file  ?Occupational History  ? Not on file  ?Tobacco Use  ? Smoking status: Never  ? Smokeless tobacco: Never  ?Vaping Use  ? Vaping Use: Never used  ?Substance and Sexual Activity  ? Alcohol use: Yes  ?  Alcohol/week: 0.0 standard drinks  ?  Comment: occ  ? Drug use: Yes  ?  Types: Marijuana  ? Sexual activity: Yes  ?  Partners: Male  ?  Birth control/protection: I.U.D.  ?  Comment: 1st intercourse- 69, partners- 30, married- 38 yrs   ?Other Topics Concern  ? Not on file  ?Social History Narrative  ? Not on file  ? ?Social Determinants of Health  ? ?Financial Resource Strain: Not on file  ?Food Insecurity: Not on file  ?Transportation Needs: Not on file  ?Physical Activity: Not on file  ?Stress: Not on file  ?Social Connections: Not on file  ? ?Additional Social History:  ?  ?Sleep: Good ? ?Appetite:  Good ? ?Current Medications: ?Current Facility-Administered Medications  ?Medication Dose Route Frequency Provider Last Rate Last Admin  ? atorvastatin (LIPITOR) tablet 20 mg  20 mg Oral QHS Margorie John W, PA-C   20 mg at 05/27/21 2047  ? buPROPion (WELLBUTRIN XL) 24 hr tablet 150 mg  150 mg Oral q morning Prescilla Sours, PA-C   150 mg at 05/29/21 0740  ? calcium-vitamin D (OSCAL WITH  D) 500-5 MG-MCG per tablet 1 tablet  1 tablet Oral QHS Prescilla Sours, PA-C   1 tablet at 05/28/21 2118  ? clonazePAM (KLONOPIN) tablet 0.5 mg  0.5 mg Oral Q12H Massengill, Nathan, MD   0.5 mg at 05/29/21 0741  ? DULoxetine (CYMBALTA) DR capsule 40 mg  40 mg Oral Daily Massengill, Nathan, MD   40 mg at 05/29/21 0740  ? hydrOXYzine (ATARAX) tablet 25 mg  25 mg Oral TID PRN Janine Limbo, MD   25 mg at 05/27/21 2211  ? latanoprost (XALATAN) 0.005 % ophthalmic solution 1 drop  1 drop Both Eyes QHS Prescilla Sours, PA-C   1 drop at 05/28/21 2205  ? loratadine (CLARITIN) tablet 10 mg  10 mg Oral Daily Prescilla Sours, PA-C  10 mg at 05/29/21 0740  ? mesalamine (LIALDA) EC tablet 4.8 g  4.8 g Oral Daily Margorie John W, PA-C   4.8 g at 05/29/21 4742  ? multivitamin with minerals tablet 1 tablet  1 tablet Oral Daily Prescilla Sours, PA-C   1 tablet at 05/29/21 0740  ? propranolol (INDERAL) tablet 10 mg  10 mg Oral BID Janine Limbo, MD   10 mg at 05/29/21 1635  ? SUMAtriptan (IMITREX) tablet 100 mg  100 mg Oral Q2H PRN Margorie John W, PA-C      ? topiramate (TOPAMAX) tablet 100 mg  100 mg Oral QHS Margorie John W, PA-C   100 mg at 05/28/21 2119  ? traZODone (DESYREL) tablet 50 mg  50 mg Oral QHS PRN Janine Limbo, MD   50 mg at 05/28/21 2120  ? ? ?Lab Results:  ?Results for orders placed or performed during the hospital encounter of 05/25/21 (from the past 48 hour(s))  ?Basic metabolic panel     Status: Abnormal  ? Collection Time: 05/27/21  6:32 PM  ?Result Value Ref Range  ? Sodium 138 135 - 145 mmol/L  ? Potassium 4.1 3.5 - 5.1 mmol/L  ? Chloride 106 98 - 111 mmol/L  ? CO2 25 22 - 32 mmol/L  ? Glucose, Bld 72 70 - 99 mg/dL  ?  Comment: Glucose reference range applies only to samples taken after fasting for at least 8 hours.  ? BUN 12 6 - 20 mg/dL  ? Creatinine, Ser 1.00 0.44 - 1.00 mg/dL  ? Calcium 8.8 (L) 8.9 - 10.3 mg/dL  ? GFR, Estimated >60 >60 mL/min  ?  Comment: (NOTE) ?Calculated using the CKD-EPI  Creatinine Equation (2021) ?  ? Anion gap 7 5 - 15  ?  Comment: Performed at [facility], [place] [address] [place], [place] [address]  ? ? ?Blood Alcohol level:  ?Lab Results  ?Component Value

## 2021-05-30 ENCOUNTER — Telehealth (HOSPITAL_COMMUNITY): Payer: Self-pay | Admitting: Professional

## 2021-05-30 MED ORDER — GABAPENTIN 100 MG PO CAPS
100.0000 mg | ORAL_CAPSULE | Freq: Three times a day (TID) | ORAL | Status: DC
Start: 2021-05-30 — End: 2021-05-31
  Administered 2021-05-30 – 2021-05-31 (×4): 100 mg via ORAL
  Filled 2021-05-30 (×7): qty 1

## 2021-05-30 MED ORDER — DULOXETINE HCL 60 MG PO CPEP
60.0000 mg | ORAL_CAPSULE | Freq: Every day | ORAL | Status: DC
  Administered 2021-05-31 – 2021-06-02 (×3): 60 mg via ORAL
  Filled 2021-05-30 (×5): qty 1

## 2021-05-30 NOTE — Progress Notes (Addendum)
Adult Psychoeducational Group Note ? ?Date:  05/30/2021 ?Time:  1:47 AM ? ?Group Topic/Focus:  ?Wrap-Up Group:   The focus of this group is to help patients review their daily goal of treatment and discuss progress on daily workbooks. ? ?Participation Level:  Active ? ?Participation Quality:  Appropriate ? ?Affect:  Appropriate ? ?Cognitive:  Appropriate ? ?Insight: Appropriate ? ?Engagement in Group:  Engaged ? ?Modes of Intervention:  Discussion ? ?Additional Comments:  Pt stated her goal for today was to focus on her treatment plan and to interact more with staff and peers. Pt stated she accomplished her goals today. Pt stated she did not get a chance to speak with a doctor or with a social worker about her care today. Pt rated her overall day a 4 out of 10. Pt stated she was able to contact her daughter, and her boyfriend today which improved her overall day. Pt stated she felt better about herself tonight. Pt stated she was able to attend all meals today. Pt stated she took all medications provided today. Pt stated her appetite was improving today. Pt rated her sleep last night was pretty good. Pt stated the goal tonight was to get some rest. Pt stated she had some physical pain tonight. Pt stated she had some mild pain in her left hip. Pt rated the mild pain in her left hip a 4 on the pain level scale. Pt nurse was updated on the situation. Pt deny visual hallucinations and auditory issues tonight. Pt denies thoughts of harming others. Pt admitted to having thoughts of harming herself this morning. Pt stated she could contract for safety. Pt nurse was updated on the situation. Pt stated she would alert staff if anything changed. ? ?Candy Sledge ?05/30/2021, 1:47 AM ?

## 2021-05-30 NOTE — Plan of Care (Signed)
Nurse discussed anxiety and coping skills with patient. 

## 2021-05-30 NOTE — Progress Notes (Addendum)
D:  Patient's self inventory sheet, patient has fair sleep.  Medication helpful.  Fair appetite, low energy level, good concentration.  Rated depression 6, hopeless and anxiety 5.  Denied  withdrawals.  SI last night while in bed.                                                              Contracts for safety.  Denied physical problems.  Denied physical pain.  Goal is get myself out of negative thought cycles.  Plans to talk to SW/MD.  Rebekah Silva of discharge plans. ?A:  Medications administered per MD orders.  Emotional support and encouragement given patient. ?R:  Denied SI and HI, contracts for safety.  Denied A/V hallucinations.  Safety maintained with 15 minutes. ? ?

## 2021-05-30 NOTE — Group Note (Signed)
LCSW Group Therapy Note ? ? ?Group Date: 05/30/2021 ?Start Time: 1300 ?End Time: 1400 ? ? ?Type of Therapy and Topic:  Group Therapy:  Positive Affirmations ?  ?Participation Level:  Active ? ?Description of Group: ?This group addressed positive affirmation toward self and others. Patients went around the room and identified two positive things about themselves and two positive things about a peer in the room. Patients reflected on how it felt to share something positive with others, to identify positive things about themselves, and to hear positive things from others. Patients were encouraged to have a daily reflection of positive characteristics or circumstances. ?Therapeutic Goals ?Patient will verbalize two of their positive qualities ?Patient will demonstrate empathy for others by stating two positive qualities about a peer in the group ?Patient will verbalize their feelings when voicing positive self affirmations and when voicing positive affirmations of others ?Patients will discuss the potential positive impact on their wellness/recovery of focusing on positive traits of self and others. ? ? ?Summary of Patient Progress:  Due to the spread of Illness on the 300 and 400 halls group was not held in the dayroom.  CSW met with each patient individually as needed with PPE precautions in place to reduce the spread of any further Illness.  CSW provided the patient's with worksheet packets and instructions on how to complete those packets.  CSW will follow up with patient's as needed.  ? ? ?Therapeutic Modalities ?Cognitive Behavioral Therapy ?Motivational Interviewing ? ?[name], LCSWA ?05/30/2021  1:55 PM   ? ?

## 2021-05-30 NOTE — BHH Group Notes (Signed)
Spiritual care group on grief and loss facilitated by chaplains Amy Burris, [facility] and Janne Napoleon, [facility]  ? ?Group Goal:  ?Support / Education around grief and loss  ? ?Members engage in facilitated group support and psycho-social education.  ? ?Group Description:  ?Spiritual care group on grief and loss facilitated by chaplain Janne Napoleon, [facility]  ? ?Group Goal:  ? ?Support / Education around grief and loss  ? ?Members engage in facilitated group support and psycho-social education.  ? ?Group Description:  ? ?Following introductions and group rules, group members engaged in facilitated group dialog and support around topic of loss, with particular support around experiences of loss in their lives. Group Identified types of loss (relationships / self / things) and identified patterns, circumstances, and changes that precipitate losses. Reflected on thoughts / feelings around loss, normalized grief responses, and recognized variety in grief experience. Group noted Worden's four tasks of grief in discussion.  ? ?Group drew on Adlerian / Rogerian, narrative, MI,  ? ?Patient Progress: Pt attended group and was an active, vocal participant. Shared loss of mother who, though still living, has cut off relationship due to difference over transgender child. Pt shared in meaningful ways that helped to broaden understanding of grief and loss by the group. ? ?Chaplain offered individual f/u support. ?

## 2021-05-30 NOTE — Progress Notes (Signed)
?   05/30/21 1145  ?Clinical Encounter Type  ?Visited With Patient  ?Visit Type Initial;Spiritual support;Social support  ?Referral From Patient  ?Consult/Referral To Chaplain  ?Spiritual Encounters  ?Spiritual Needs Emotional  ? ?Chaplain Amy Burris responded to Pt's need for individual time to process emotions following group session on grief. ? ?Chaplain Burris provided non-anxious, compassionate presence. In addition to active and reflective listening, Rebekah Silva offered normalization of emotions and affirmatino for choices Pt may need to make regarding boundary setting with family or others regarding ongoing process of raising a transgender teen. ? ?Rebekah Silva will attempt to return this afternoon or otherwise f/u with some resources that may help support how to engage topic of transgender life with regard to faith. Chaplain B offered validation of Pt's effort to be a supportive parent; also affirmed Pt in her efforts to seek her own healing needs at this time. ?
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
## 2021-05-30 NOTE — BHH Group Notes (Signed)
PT attended AA, Was appropriate and attentive.  ?

## 2021-05-30 NOTE — Progress Notes (Addendum)
Did not give patient Inderol 10 mg this afternoon.  BP was 87/55 sitting.  Encouraged patient to eat dinner, drink plenty of fluids, walk.  Standing BP was 105/69. ?

## 2021-05-30 NOTE — Progress Notes (Signed)
?   05/30/21 1615  ?Clinical Encounter Type  ?Visited With Patient  ?Visit Type Follow-up  ?Spiritual Encounters  ?Spiritual Needs Other (Comment) ?(resource)  ? ?Chaplain Burris provided a resource to support future discussions with family over faith and transgender issues. Pt grateful for the visit earlier and seemed to have gotten some peace through processing emotions and support. ?

## 2021-05-30 NOTE — Group Note (Signed)
Recreation Therapy Group Note ? ? ?Group Topic:Stress Management  ?Group Date: 05/30/2021 ?Start Time: 0930 ?End Time: 3888 ?Facilitators: Victorino Sparrow, LRT,CTRS ?Location: [place] ? ? ?Goal Area(s) Addresses:  ?Patient will identify positive stress management techniques. ?Patient will identify benefits of using stress management post d/c. ? ?Group Description:  Meditation.  LRT played a meditation that focused on finding your rhythm through breathing to help calm stress.  Patients were to let whatever rhythm of breathing that was comfortable to them, calm and relax them.  Patients were to listen along as meditation played to fully engage in activity. ? ? ?Affect/Mood: Appropriate ?  ?Participation Level: Active ?  ?Participation Quality: Independent ?  ?Behavior: Attentive  ?  ?Speech/Thought Process: Focused ?  ?Insight: Good ?  ?Judgement: Good ?  ?Modes of Intervention: Meditation ?  ?Patient Response to Interventions:  Engaged ?  ?Education Outcome: ? Acknowledges education and In group clarification offered   ? ?Clinical Observations/Individualized Feedback: Pt attended and participated in group session.  ?  ? ?Plan: Continue to engage patient in RT group sessions 2-3x/week. ? ? ?Victorino Sparrow, LRT,CTRS ?05/30/2021 11:56 AM ?

## 2021-05-30 NOTE — Progress Notes (Signed)
D:  Rebekah Silva was out and visible on the unit.  She was pleasant and cooperative.  She reported that the coloring has been very helpful as a coping skill. She reported her overall day was 7/10 and felt that "being diagnosed with obsessive-compulsive personality traits" was the reason her day was better.  She appears to be in no physical distress.  She did request trazodone for sleep tonight. ?A:  1:1 with RN for support and encouragement.  Medications given as ordered.  Q 15 minute checks maintained for safety.  Encouraged participation in group and unit activities.   ?R:  She is currently resting with her eyes closed and appears to be asleep.  She remains safe on the unit.   ? ? ? 05/30/21 2129  ?Psych Admission Type (Psych Patients Only)  ?Admission Status Voluntary  ?Psychosocial Assessment  ?Patient Complaints Anxiety  ?Eye Contact Fair  ?Facial Expression Anxious  ?Affect Appropriate to circumstance  ?Speech Logical/coherent  ?Interaction Assertive  ?Motor Activity Other (Comment) ?(unremarkable)  ?Appearance/Hygiene Unremarkable  ?Behavior Characteristics Cooperative;Appropriate to situation;Calm  ?Mood Anxious  ?Thought Process  ?Coherency WDL  ?Content WDL  ?Delusions None reported or observed  ?Perception WDL  ?Hallucination None reported or observed  ?Judgment Poor  ?Confusion None  ?Danger to Self  ?Current suicidal ideation? Denies  ?Self-Injurious Behavior No self-injurious ideation or behavior indicators observed or expressed   ?Danger to Others  ?Danger to Others None reported or observed  ? ? ?

## 2021-05-31 LAB — CBC WITH DIFFERENTIAL/PLATELET
Abs Immature Granulocytes: 0.04 10*3/uL (ref 0.00–0.07)
Basophils Absolute: 0.1 10*3/uL (ref 0.0–0.1)
Basophils Relative: 1 %
Eosinophils Absolute: 0.1 10*3/uL (ref 0.0–0.5)
Eosinophils Relative: 1 %
HCT: 43.3 % (ref 36.0–46.0)
Hemoglobin: 14 g/dL (ref 12.0–15.0)
Immature Granulocytes: 0 %
Lymphocytes Relative: 30 %
Lymphs Abs: 3 10*3/uL (ref 0.7–4.0)
MCH: 29.7 pg (ref 26.0–34.0)
MCHC: 32.3 g/dL (ref 30.0–36.0)
MCV: 91.7 fL (ref 80.0–100.0)
Monocytes Absolute: 0.8 10*3/uL (ref 0.1–1.0)
Monocytes Relative: 9 %
Neutro Abs: 5.7 10*3/uL (ref 1.7–7.7)
Neutrophils Relative %: 59 %
Platelets: 302 10*3/uL (ref 150–400)
RBC: 4.72 MIL/uL (ref 3.87–5.11)
RDW: 16.9 % — ABNORMAL HIGH (ref 11.5–15.5)
WBC: 9.8 10*3/uL (ref 4.0–10.5)
nRBC: 0 % (ref 0.0–0.2)

## 2021-05-31 MED ORDER — TOPIRAMATE 25 MG PO TABS
50.0000 mg | ORAL_TABLET | Freq: Two times a day (BID) | ORAL | Status: DC
  Administered 2021-05-31 – 2021-06-02 (×4): 50 mg via ORAL
  Filled 2021-05-31 (×9): qty 2

## 2021-05-31 MED ORDER — TRAZODONE HCL 100 MG PO TABS: 100.0000 mg | ORAL_TABLET | Freq: Every evening | ORAL | Status: DC | PRN

## 2021-05-31 MED ORDER — GABAPENTIN 100 MG PO CAPS
200.0000 mg | ORAL_CAPSULE | Freq: Three times a day (TID) | ORAL | Status: DC
Start: 2021-05-31 — End: 2021-06-01
  Administered 2021-05-31 – 2021-06-01 (×3): 200 mg via ORAL
  Filled 2021-05-31 (×7): qty 2

## 2021-05-31 MED ORDER — QUETIAPINE FUMARATE 25 MG PO TABS
25.0000 mg | ORAL_TABLET | Freq: Three times a day (TID) | ORAL | Status: DC
  Administered 2021-05-31 – 2021-06-01 (×3): 25 mg via ORAL
  Filled 2021-05-31 (×7): qty 1

## 2021-05-31 MED ORDER — TRAZODONE HCL 50 MG PO TABS: 50.0000 mg | ORAL_TABLET | Freq: Every evening | ORAL | Status: DC | PRN
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
## 2021-05-31 NOTE — Progress Notes (Signed)
?   05/31/21 8295  ?Psych Admission Type (Psych Patients Only)  ?Admission Status Voluntary  ?Psychosocial Assessment  ?Patient Complaints Anxiety  ?Eye Contact Fair  ?Facial Expression Anxious  ?Affect Appropriate to circumstance  ?Speech Logical/coherent  ?Interaction Assertive  ?Motor Activity Other (Comment) ?(WNL)  ?Appearance/Hygiene Unremarkable  ?Behavior Characteristics Cooperative;Anxious  ?Mood Anxious;Depressed  ?Thought Process  ?Coherency WDL  ?Content WDL  ?Delusions None reported or observed  ?Perception WDL  ?Hallucination None reported or observed  ?Judgment Poor  ?Confusion None  ?Danger to Self  ?Current suicidal ideation? Denies  ?Self-Injurious Behavior No self-injurious ideation or behavior indicators observed or expressed   ?Danger to Others  ?Danger to Others None reported or observed  ? ?Patient is pleasant, and and oriented to person, place and situation during shift assessment. Patient denies SI/HI/AVH, but endorses anxiety. Patient states "My depression is still hanging at the back of my head, but I'm fighting so hard to get it off." Patient is compliant with routine medication, tolerated them well with no side effect noted. Patient denies any pain or discomfort. Hydroxyzine 25 mg administered to patient at 1014 for complaint of anxiety. Patient states to feeling better minutes after atarax was given to patient. No distress noted at this time. Staff will continue to monitor. ?

## 2021-05-31 NOTE — Group Note (Signed)
Date:  05/31/2021 ?Time:  10:52 AM ? ?Group Topic/Focus:  ?Orientation:   The focus of this group is to educate the patient on the purpose and policies of crisis stabilization and provide a format to answer questions about their admission.  The group details unit policies and expectations of patients while admitted. ? ? ? ?Participation Level:  Active ? ?Participation Quality:  Appropriate ? ?Affect:  Appropriate ? ?Cognitive:  Appropriate ? ?Insight: Appropriate ? ?Engagement in Group:  Engaged ? ?Modes of Intervention:  Discussion ? ?Additional Comments:    ? ?Jerrye Beavers ?05/31/2021, 10:52 AM ? ?

## 2021-05-31 NOTE — Group Note (Signed)
Recreation Therapy Group Note ? ? ?Group Topic:Animal Assisted Therapy   ?Group Date: 05/31/2021 ?Start Time: 1430 ?End Time: 7121 ?Facilitators: Victorino Sparrow, LRT,CTRS ?Location: [place] ? ? ?Animal-Assisted Activity (AAA) Program Checklist/Progress Note ?Patient Eligibility Criteria Checklist & Daily Group note for Rec Tx Intervention ? ?AAA/T Program Assumption of Risk Form signed by Patient/ or Parent Legal Guardian YES ? ?Patient understands their participation is voluntary YES ? ?Group Description: Patients provided opportunity to interact with trained and credentialed [employer] [facility] dog and the community volunteer/dog handler. Patients practiced appropriate animal interaction and were educated on dog safety outside of the hospital in common community settings. Patients were allowed to use dog toys and other items to practice commands, engage the dog in play, and/or complete routine aspects of animal care.  ? ?Education: Contractor, Pensions consultant, Communication & Social Skills  ? ? ?Affect/Mood: N/A ?  ?Participation Level: Did not attend ?  ? ?Clinical Observations/Individualized Feedback:   ? ? ?Plan: Continue to engage patient in RT group sessions 2-3x/week. ? ? ?Victorino Sparrow, LRT,CTRS ?05/31/2021 3:36 PM ?

## 2021-05-31 NOTE — Progress Notes (Signed)
Adult Psychoeducational Group Note ? ?Date:  05/31/2021 ?Time:  8:38 PM ? ?Group Topic/Focus:  ?Wrap-Up Group:   The focus of this group is to help patients review their daily goal of treatment and discuss progress on daily workbooks. ? ?Participation Level:  Active ? ?Participation Quality:  Appropriate ? ?Affect:  Appropriate ? ?Cognitive:  Appropriate ? ?Insight: Appropriate ? ?Engagement in Group:  Engaged ? ?Modes of Intervention:  Discussion ? ?Additional Comments:  Patient said her day was 5. Her goa for today be social and detach from negative intrusive thoughts. She did no completely meet her goal. Coping skills coloring in a group talking while coloring. She fustrate dietary restrictions do not go to cafeteria for patients. She should not have to ask and remind multiple times she need dairy free. There need to be considerations for for those who are laltose intorlerat . Those on Lent at least on Friday for meatless options those who dont eat port  bacon and saugage . Cafeteria needs some cultural awareness. ? ?Lenice Llamas Long ?05/31/2021, 8:38 PM ?

## 2021-05-31 NOTE — Plan of Care (Signed)
?  Problem: Activity: ?Goal: Interest or engagement in activities will improve ?Outcome: Progressing ?Goal: Sleeping patterns will improve ?Outcome: Progressing ?  ?Problem: Coping: ?Goal: Ability to verbalize frustrations and anger appropriately will improve ?Outcome: Progressing ?Goal: Ability to demonstrate self-control will improve ?Outcome: Progressing ?  ?Problem: Safety: ?Goal: Periods of time without injury will increase ?Outcome: Progressing ?  ?

## 2021-06-01 ENCOUNTER — Encounter (HOSPITAL_COMMUNITY): Payer: Self-pay

## 2021-06-01 MED ORDER — CLONAZEPAM 0.25 MG PO TBDP
0.2500 mg | ORAL_TABLET | Freq: Two times a day (BID) | ORAL | Status: DC
  Administered 2021-06-01 – 2021-06-02 (×2): 0.25 mg via ORAL
  Filled 2021-06-01 (×2): qty 1

## 2021-06-01 MED ORDER — GABAPENTIN 300 MG PO CAPS
300.0000 mg | ORAL_CAPSULE | Freq: Three times a day (TID) | ORAL | Status: DC
Start: 2021-06-01 — End: 2021-06-02
  Administered 2021-06-01 – 2021-06-02 (×3): 300 mg via ORAL
  Filled 2021-06-01 (×8): qty 1

## 2021-06-01 MED ORDER — QUETIAPINE FUMARATE 25 MG PO TABS
25.0000 mg | ORAL_TABLET | Freq: Three times a day (TID) | ORAL | Status: DC
  Administered 2021-06-01 – 2021-06-02 (×2): 25 mg via ORAL
  Filled 2021-06-01 (×9): qty 1

## 2021-06-01 NOTE — B[facility] Group Notes (Signed)
Group Date: 06/01/2021 ?Start Time: 1300 ?End Time: 1400 ?  ?LCSW Group Therapy Note ?  ?Type of Therapy/Topic: Group Therapy: Six Dimensions of Wellness ?  ?Participation Level: Active ?  ?Description of Group: ?  ?This group will address the concept of wellness and the six concepts of wellness: occupational, physical, social, intellectual, spiritual, and emotional. Patients will be encouraged to process areas in their lives that are out of balance and identify reasons for remaining unbalanced. Patients will be encouraged to explore ways to practice healthy habits daily to attain better physical and mental health outcomes. ?  ?Therapeutic Goals: ?  ?1. Identify aspects of wellness that they are doing well. ?  ?2. Identify aspects of wellness that they would like to improve upon. ?  ?3. Identify one action they can take to improve an aspect of wellness in their lives. ?  ?Summary of Patient Progress: The Pt was provided with a packet of worksheets to help with improving wellness.  Due to illness on the adult unit CSW met with each Pt individually to discuss the packet of materials and answer any questions the Pt had.  CSW will follow up as needed with each Pt.  ?  ?[name], LCSWA ?06/01/2021  1:18 PM   ?

## 2021-06-01 NOTE — Progress Notes (Signed)
D:  Patient's self inventory sheet, patient sleeps good, no sleep medication.  Good appetite, low energy level, good concentration.  Rated depression 3, hopeless 1, anxiety 4.  Denied withdrawals.  Denied SI.  Denied physical problems.  Denied physical pain.  Goal is work on future goals.  Plans to work on future goals.  Plans to make calls.  Does have discharge plans.  Plans to work on anxiety. ?A:  Medications administered per MD orders.  Emotional support and encouragements given patient. ?R:  Denied SI and HI, contracts for safety.  Denied A/V hallucinations.  Safety maintained with 15 minute checks. ? ?

## 2021-06-01 NOTE — Progress Notes (Signed)
?   05/31/21 2000  ?Psych Admission Type (Psych Patients Only)  ?Admission Status Voluntary  ?Psychosocial Assessment  ?Patient Complaints Anxiety  ?Eye Contact Fair  ?Facial Expression Anxious  ?Affect Appropriate to circumstance  ?Speech Logical/coherent  ?Interaction Assertive  ?Motor Activity Other (Comment) ?(WDL)  ?Appearance/Hygiene Unremarkable  ?Behavior Characteristics Cooperative;Appropriate to situation  ?Mood Anxious  ?Thought Process  ?Coherency WDL  ?Content WDL  ?Delusions None reported or observed  ?Perception WDL  ?Hallucination None reported or observed  ?Judgment WDL  ?Confusion None  ?Danger to Self  ?Current suicidal ideation? Denies  ?Danger to Others  ?Danger to Others None reported or observed  ? ? ?

## 2021-06-01 NOTE — Group Note (Signed)
Recreation Therapy Group Note ? ? ?Group Topic:Stress Management  ?Group Date: 06/01/2021 ?Start Time: 0930 ?End Time: 7824 ?Facilitators: Victorino Sparrow, LRT,CTRS ?Location: [place] ? ? ?Goal Area(s) Addresses:  ?Patient will actively participate in stress management techniques presented during session.  ?Patient will successfully identify benefit of practicing stress management post d/c.  ? ?Group Description: Guided Imagery. LRT provided education, instruction, and demonstration on practice of visualization via guided imagery. Patient was asked to participate in the technique introduced during session. LRT debriefed including topics of mindfulness, stress management and specific scenarios each patient could use these techniques. Patients were given suggestions of ways to access scripts post d/c and encouraged to explore Youtube and other apps available on smartphones, tablets, and computers. ? ? ?Affect/Mood: N/A ?  ?Participation Level: Did not attend ?  ? ?Clinical Observations/Individualized Feedback:   ? ? ?Plan: Continue to engage patient in RT group sessions 2-3x/week. ? ? ?Victorino Sparrow, LRT,CTRS ?06/01/2021 10:52 AM ?

## 2021-06-01 NOTE — Progress Notes (Signed)
Pt verbalized one episode of runny stool. Denied nausea or vomiting. Pt made aware to notify staff for increase GI symptoms.  ?

## 2021-06-01 NOTE — Group Note (Signed)
[facility] LCSW Group Therapy ? ? ?Type of Therapy and Topic:  Group Therapy:  Wellness ? ? ?Participation Level: Active ? ?Description of Group: ?This group allows individuals to explore the 6 dimensions of wellness, including spiritual, emotional, intellectual, physical, social, environmental, financial and spiritual. Patients will learn to different ways to practice wellness to improve well-being. Patients also participated in a conversation about what wellness means to them.   Individuals will think about ways in which they currently practice wellness as well as ways they can improve their wellness and new ways to practice wellness.   ? ? ?  ?Therapeutic Goals ?Patient will verbalize 1 pr 2 we;;mess areas where they are doing well. ?Patient will identify 2 areas where they would like to improve their wellness.   ?Patient will provide a definition of what wellness means to them.  ?Patients will reflect on current hospitalization and primary areas to maintain mental health to prevent re-hospitalization.  ?  ? ?Summary of Patient Progress:  Due to sickness on the unit, group was held individually.  CSW met with patient individually and went over topic information. CSW encouraged patient to participate in activities and worksheets associated with group topic.  Patient agreed to find CSW if they would like to discuss and talk about topic.   ? ?  ?  ? ?Therapeutic Modalities ?Cognitive Behavioral Therapy ?Motivational Interviewing ? ? ?Chibuikem Thang, LCSW, LCAS ?Clincal Social Worker  ?[facility] ? ? ? ?
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
## 2021-06-01 NOTE — Progress Notes (Signed)
Patient complaining about the food. Patient said she can not eat dairy products.  ?Writer obtain tray grits waffles  bananas apples green apple and a red apple. 3 fruit punch and bottle water. Patient said she never thought to eat this for breakfast. Writer tried to give patients options and choices. There are some foods she can eat from the cafeteria that do not all contain dairy products. ?

## 2021-06-01 NOTE — Plan of Care (Signed)
Nurse discussed anxiety, depression and coping skills with patient.  

## 2021-06-01 NOTE — [facility] Group Notes (Signed)
Pt did not attend group ?

## 2021-06-02 LAB — GASTROINTESTINAL PANEL BY PCR, STOOL (REPLACES STOOL CULTURE)

## 2021-06-02 LAB — CBC WITH DIFFERENTIAL/PLATELET
Abs Immature Granulocytes: 0.02 10*3/uL (ref 0.00–0.07)
Basophils Absolute: 0 10*3/uL (ref 0.0–0.1)
Basophils Relative: 1 %
Eosinophils Absolute: 0.1 10*3/uL (ref 0.0–0.5)
Eosinophils Relative: 2 %
HCT: 40.1 % (ref 36.0–46.0)
Hemoglobin: 12.8 g/dL (ref 12.0–15.0)
Immature Granulocytes: 0 %
Lymphocytes Relative: 32 %
Lymphs Abs: 2.3 10*3/uL (ref 0.7–4.0)
MCH: 29.3 pg (ref 26.0–34.0)
MCHC: 31.9 g/dL (ref 30.0–36.0)
MCV: 91.8 fL (ref 80.0–100.0)
Monocytes Absolute: 0.7 10*3/uL (ref 0.1–1.0)
Monocytes Relative: 10 %
Neutro Abs: 4 10*3/uL (ref 1.7–7.7)
Neutrophils Relative %: 55 %
Platelets: 285 10*3/uL (ref 150–400)
RBC: 4.37 MIL/uL (ref 3.87–5.11)
RDW: 17.1 % — ABNORMAL HIGH (ref 11.5–15.5)
WBC: 7.2 10*3/uL (ref 4.0–10.5)
nRBC: 0 % (ref 0.0–0.2)

## 2021-06-02 LAB — BASIC METABOLIC PANEL
Anion gap: 5 (ref 5–15)
BUN: 10 mg/dL (ref 6–20)
CO2: 22 mmol/L (ref 22–32)
Calcium: 8.4 mg/dL — ABNORMAL LOW (ref 8.9–10.3)
Chloride: 111 mmol/L (ref 98–111)
Creatinine, Ser: 0.72 mg/dL (ref 0.44–1.00)
GFR, Estimated: >60 mL/min (ref >60)
Glucose, Bld: 107 mg/dL — ABNORMAL HIGH (ref 70–99)
Potassium: 3.5 mmol/L (ref 3.5–5.1)
Sodium: 138 mmol/L (ref 135–145)

## 2021-06-02 LAB — HEPATITIS PANEL, ACUTE
HCV Ab: NONREACTIVE
Hep A IgM: NONREACTIVE
Hep B C IgM: NONREACTIVE
Hepatitis B Surface Ag: NONREACTIVE

## 2021-06-02 LAB — HEPATIC FUNCTION PANEL
ALT: 29 U/L (ref 0–44)
AST: 27 U/L (ref 15–41)
Albumin: 3.2 g/dL — ABNORMAL LOW (ref 3.5–5.0)
Alkaline Phosphatase: 57 U/L (ref 38–126)
Bilirubin, Direct: <0.1 mg/dL (ref 0.0–0.2)
Total Bilirubin: 0.2 mg/dL — ABNORMAL LOW (ref 0.3–1.2)
Total Protein: 6.1 g/dL — ABNORMAL LOW (ref 6.5–8.1)

## 2021-06-02 MED ORDER — DULOXETINE HCL 60 MG PO CPEP: 60.0000 mg | ORAL_CAPSULE | Freq: Every day | ORAL | 0 refills | Status: AC

## 2021-06-02 MED ORDER — LORAZEPAM 1 MG PO TABS: 1.0000 mg | ORAL_TABLET | Freq: Four times a day (QID) | ORAL | 0 refills | Status: AC | PRN

## 2021-06-02 MED ORDER — TOPIRAMATE 50 MG PO TABS: 50.0000 mg | ORAL_TABLET | Freq: Two times a day (BID) | ORAL | 0 refills | Status: AC

## 2021-06-02 MED ORDER — LORAZEPAM 1 MG PO TABS: 1.0000 mg | ORAL_TABLET | Freq: Four times a day (QID) | ORAL | Status: DC | PRN

## 2021-06-02 MED ORDER — QUETIAPINE FUMARATE 25 MG PO TABS: 25.0000 mg | ORAL_TABLET | Freq: Three times a day (TID) | ORAL | 0 refills | Status: DC

## 2021-06-02 MED ORDER — HYDROXYZINE HCL 25 MG PO TABS
25.0000 mg | ORAL_TABLET | Freq: Three times a day (TID) | ORAL | 0 refills | Status: AC | PRN
Start: 2021-06-02 — End: ?

## 2021-06-02 MED ORDER — GABAPENTIN 300 MG PO CAPS: 300.0000 mg | ORAL_CAPSULE | Freq: Three times a day (TID) | ORAL | 0 refills | Status: DC

## 2021-06-02 NOTE — [facility] Counselor (Signed)
CSW completed FMLA paperwork for the Pt and returned it to the Pt's chart so the Pt can return it to her employer.  CSW also returned the Pt's short-term disability paperwork to the Pt's chart to allow the Pt to have it completed by her outpatient provider.  ?
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
## 2021-06-02 NOTE — Plan of Care (Signed)
Nurse discussed coping skills with patient.  
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
## 2021-06-02 NOTE — Progress Notes (Addendum)
D:  Patient's self inventory sheet, patient sleeps good, no sleep medicine administered.  Good appetite, normal energy level, good concentration.  Rated depression 2, hopeless 1, anxiety 3-4.  Denied withdrawals.  Denied SI.  Physical problems, pain, shoulder pain, I suppose from leaning up since no table to sit at to color/ write.  Thank you for doing everything to help. ?A:  Medications administered per MD orders.  Emotional support and encouragement given patient. ?R:  Denied SI and HI, contracts for safety.  Denied A/V hallucinations. ?Safety maintained with 15 minute checks. ? ? ?

## 2021-06-02 NOTE — Progress Notes (Signed)
Recreation Therapy Notes ? ? ? ?LRT left packets for patients with MHT that were focused on stress management. ? ? ? ? ?Victorino Sparrow, LRT,CTRS ?Victorino Sparrow A ?06/02/2021 11:00 AM ?

## 2021-06-02 NOTE — Progress Notes (Signed)
?  [facility] Adult Case Management Discharge Plan : ? ?Will you be returning to the same living situation after discharge:  Yes,  Home  ?At discharge, do you have transportation home?: Yes,  Husband  ?Do you have the ability to pay for your medications: Yes,  Insurance  ? ?Release of information consent forms completed and in the chart;  Patient's signature needed at discharge. ? ?Patient to Follow up at: ? Follow-up Information   ? ? Harlan Stains, MD. Daphane Shepherd on 06/07/2021.   ?Specialty: Family Medicine ?Why: You have a hospital follow up appointment with your primary care provider on 06/07/21 at 9:30 am.  This appointment will be held in person. ?Contact information: ?[address], [address] ?[place] [address] ?[phone] ? ? ?  ?  ? ? Kathaleen Maser, Counselor. Schedule an appointment as soon as possible for a visit.   ?Why: A referral has been made to this provider for therapy services.  This provider currently has a wait list for new and established patients. The provider will contact you when an appointment becomes available. ?Contact information: ?([facility] ?[address] ?[place], [place] [address] ? ?P:  [phone] ? ?  ?  ? ? BEHAVIORAL HEALTH PARTIAL HOSPITALIZATION PROGRAM Follow up on 06/06/2021.   ?Specialty: Behavioral Health ?Why: You have an assessment appointment on 06/06/21 at 10:00 am.  This appointment will last approximately one hour and will be Virtual via Webex.  PHP is Virtual group therapy that runs Mon-Fri from 9 am - 1 pm.  Please download the [employer] app prior to the appt.  For questions, call [phone]. ?Contact information: ?[address] ?[place] ?[phone] ? ?  ?  ? ? [facility]. Go on 06/10/2021.   ?Why: You have an appointment for behavioral health therapy/counseling services on 06/10/21 at 11:00 am. This appointment will be held in person. ?Contact information: ?[address], [place] ?[place] [address] ?[phone] ? ? ?  ?  ? ?  ?  ? ?  ? ? ?Next level of care provider has access to [facility] ? ?Safety Planning and Suicide Prevention discussed: Yes,  with patient and husband  ? ?  ? ?Has patient been referred to the Quitline?: N/A patient is not a smoker ? ?Patient has been referred for addiction treatment: Pt. refused referral ? ?[name], LCSWA ?06/02/2021, 10:05 AM ?

## 2021-06-06 ENCOUNTER — Other Ambulatory Visit (HOSPITAL_COMMUNITY): Payer: BC Managed Care – PPO | Attending: Psychiatry | Admitting: Professional

## 2021-06-06 ENCOUNTER — Other Ambulatory Visit: Payer: Self-pay

## 2021-06-06 ENCOUNTER — Telehealth (HOSPITAL_COMMUNITY): Payer: Self-pay | Admitting: Professional

## 2021-06-06 DIAGNOSIS — F332 Major depressive disorder, recurrent severe without psychotic features: Secondary | ICD-10-CM | POA: Insufficient documentation

## 2021-06-06 DIAGNOSIS — F411 Generalized anxiety disorder: Secondary | ICD-10-CM | POA: Insufficient documentation

## 2021-06-09 ENCOUNTER — Other Ambulatory Visit (HOSPITAL_COMMUNITY): Payer: BC Managed Care – PPO | Admitting: Specialist

## 2021-06-09 ENCOUNTER — Other Ambulatory Visit (HOSPITAL_COMMUNITY): Payer: BC Managed Care – PPO | Admitting: Licensed Clinical Social Worker

## 2021-06-09 ENCOUNTER — Encounter (HOSPITAL_COMMUNITY): Payer: Self-pay | Admitting: Specialist

## 2021-06-09 DIAGNOSIS — F332 Major depressive disorder, recurrent severe without psychotic features: Secondary | ICD-10-CM

## 2021-06-09 DIAGNOSIS — F411 Generalized anxiety disorder: Secondary | ICD-10-CM

## 2021-06-09 DIAGNOSIS — R4589 Other symptoms and signs involving emotional state: Secondary | ICD-10-CM

## 2021-06-09 DIAGNOSIS — F329 Major depressive disorder, single episode, unspecified: Secondary | ICD-10-CM

## 2021-06-09 NOTE — Therapy (Signed)
[facility] ?[facility] ?[place], [place], [address] ?Phone: [phone]   Fax:  [phone] ?Virtual Visit via Video Note ? ?I connected with Tracie Harrier Griffith on 06/09/21 at  11:00 AM EDT by a video enabled telemedicine application and verified that I am speaking with the correct person using two identifiers. ? ?Location: ?Patient: home ?Provider: clinic ?  ?  ?The patient was advised to call back or seek an in-person evaluation if the symptoms worsen or if the condition fails to improve as anticipated. ? ?I provided 60 minutes of non-face-to-face time during this encounter. ? ? ? ? ? ?Occupational Therapy Evaluation ? ?Patient Details  ?Name: Ryka Beighley Luis ?MRN: [id number] ?Date of Birth: Nov 29, 1970 ?Referring Provider (OT): Lendon Colonel, PA ? ? ?Encounter Date: 06/09/2021 ? ? OT End of Session - 06/09/21 1242   ? ? Visit Number 1   ? Number of Visits 20   ? Date for OT Re-Evaluation 07/09/21   ? Authorization Type [employer]BS in process of obtaining authorization information   ? OT Start Time 1100   ? OT Stop Time 1200   ? OT Time Calculation (min) 60 min   ? Activity Tolerance Patient tolerated treatment well   ? Behavior During Therapy [facility] for tasks assessed/performed   ? ?  ?  ? ?  ? ? ?Past Medical History:  ?Diagnosis Date  ? Arthritis   ? ra  ? Colitis   ? GERD (gastroesophageal reflux disease)   ? Hypercholesterolemia   ? DR. WHITE  ? Migraines   ? PIH (pregnancy induced hypertension) yrs ago, none since  ? PONV (postoperative nausea and vomiting)   ? Wears glasses   ? ? ?Past Surgical History:  ?Procedure Laterality Date  ? ANAL FISSURE REPAIR N/A 04/09/2015  ? Procedure: ANAL CHEMICAL DENERVATION(BOTOX);  Surgeon: Ralene Ok, MD;  Location: [facility];  Service: General;  Laterality: N/A;  ? ANAL FISSURE REPAIR  07/06/2016  ? CATARACT EXTRACTION Left   ? CESAREAN SECTION  2007  ? COLONOSCOPY    ? INTRAUTERINE DEVICE INSERTION  12/15/2005  ? MOUTH  SURGERY    ? RECTAL EXAM UNDER ANESTHESIA N/A 02/17/2015  ? Procedure: RECTAL EXAM UNDER ANESTHESIA;  Surgeon: Ralene Ok, MD;  Location: [facility];  Service: General;  Laterality: N/A;  ? RETINAL DETACHMENT SURGERY  2019  ? X 5   ? SPHINCTEROTOMY N/A 02/17/2015  ? Procedure: LATERAL INTERNAL SPHINCTEROTOMY;  Surgeon: Ralene Ok, MD;  Location: [facility];  Service: General;  Laterality: N/A;  ? ? ?There were no vitals filed for this visit. ? ? Subjective Assessment - 06/09/21 1240   ? ? Currently in Pain? No/denies   ? Pain Score 0-No pain   ? ?  ?  ? ?  ? ? ? ? [facility] OT Assessment - 06/09/21 0001   ? ?  ? Assessment  ? Medical Diagnosis Major Depressive Disorder   ? Referring Provider (OT) Lendon Colonel, PA   ?  ? Balance Screen  ? Has the patient fallen in the past 6 months No   ? Has the patient had a decrease in activity level because of a fear of falling?  No   ? Is the patient reluctant to leave their home because of a fear of falling?  No   ? ?  ?  ? ?  ? ? ? ? ? ?S:  I worry because I fear how others view me.   ?  O:  Group focus today was on realizing our circle of control in life in relation to worrying.  Group opened with members discussing why we worry and types of healthy and unhealthy worrying.  Healthy worrying is normal, can be caused by day to day issues, and is healthy when you identify if it something you can control and take action on the issue.  Unhealthy worrying occurs when it is centered around something outside of your control or if it is within your control and you do not take action on the item.  Patient was able to identify examples of items that would fall within circle of control and outside of their circle of control.  Group then delved deeper into those items that were out of their control, and how they could reframe their thoughts, actions, etc to be in better control of the situation.  Group spent time exploring the following beliefs:  I cant control anyone else, but I can control my  thoughts, words, choices, actions reactions, future.  I cant control automatic thoughts, but I can control if they stay by recognizing them, disagreeing with them, disproving them, letting them go, and thinking positively.  I can't control my past mistakes but I can control my future by admitting mistakes, apologizing, forgiving others, trying again, and starting over.  Patient then worked to determine what has been a worry for them recently and if it was inside or outside of their circle of control.  Group closed with education on the four agreements to live by:  be impeccable with your word, don't take things personally, don't make assumptions, and always do your best.  Keeping these four agreements in mind can improve your freedom limiting beliefs and worry. ?Completed general causality orientation scale:  autonomy oriented.  She is clear about what he or she is doing for.  There is clear connection between behavior and interest/personal goals.  Motiviation is intact.  ?A:Rosabell was engaged in group this date.  She shared that she tends to worry because she is fearful of what others will view her as.  Fronia was able to to share constructive approaches to another group members worry.  Thayer did not share what she felt was in and not in her control.   ?P: Skilled OT intervention 5 times per week for 4 weeks in order to improve independence with ADLs, pyschosocial, and coping skills in order to return to prior level of function and reintegrate into community.  Next group focus to be assertive communication.   ? ? ? ? ? ? ? ? ? ? ? OT Education - 06/09/21 1241   ? ? Education Details circle of control in relation to worrying   ? Person(s) Educated Patient   ? Methods Explanation;Handout   ? Comprehension Verbalized understanding   ? ?  ?  ? ?  ? ? ? OT Short Term Goals - 06/09/21 1247   ? ?  ? OT SHORT TERM GOAL #1  ? Title Pt will apply psychosocial skills and coping mechanisms to daily activities in order to  function independently and reintegrate into community.   ? Time 4   ? Period Weeks   ? Status New   ? Target Date 07/09/21   ?  ? OT SHORT TERM GOAL #2  ? Title Patient will actively engage in OT group sessions throughout duration of PHP programming, in order to promote daily structure, social engagement, and opportunities to develop and utilize adaptive strategies to maximize  functional performance in preparation for safe transition and integration back into school, work, and community living.   ? Time 4   ? Period Weeks   ? Status New   ?  ? OT SHORT TERM GOAL #3  ? Title Patient will be educated and independent on strategies to improve communication skills and making requests of others.   ? Time 4   ? Period Weeks   ? Status New   ? ?  ?  ? ?  ? ? ? ? ? ? ? ? ? ? ? Plan - 06/09/21 1244   ? ? OT Occupational Profile and History Problem Focused Assessment - Including review of records relating to presenting problem   ? Occupational performance deficits (Please refer to evaluation for details): ADL's;Work;Social Participation   ? Psychosocial Skills Coping Strategies;Interpersonal Interaction;Routines and Behaviors   ? Rehab Potential Excellent   ? Clinical Decision Making Limited treatment options, no task modification necessary   ? Comorbidities Affecting Occupational Performance: None   ? Modification or Assistance to Complete Evaluation  No modification of tasks or assist necessary to complete eval   ? OT Frequency 5x / week   ? OT Duration 4 weeks   ? OT Treatment/Interventions Self-care/ADL training;Patient/family education;Psychosocial skills training;Coping strategies training   ? Consulted and Agree with Plan of Care Patient   ? ?  ?  ? ?  ? ? ?Patient will benefit from skilled therapeutic intervention in order to improve the following deficits and impairments:   ?  ?  ?Psychosocial Skills: Coping Strategies, Interpersonal Interaction, Routines and Behaviors ? ? ?Visit Diagnosis: ?Major depressive disorder,  remission status unspecified, unspecified whether recurrent ? ?Difficulty coping ? ? ? ?Problem List ?Patient Active Problem List  ? Diagnosis Date Noted  ? MDD (major depressive disorder), recurrent sever

## 2021-06-09 NOTE — Psych (Signed)
Virtual Visit via Video Note ? ?I connected with Rebekah Silva on 06/06/21 at 10:00 AM EDT by a video enabled telemedicine application and verified that I am speaking with the correct person using two identifiers. ? ?Location: ?Patient: home ?Provider: clinical home office ?  ?I discussed the limitations of evaluation and management by telemedicine and the availability of in person appointments. The patient expressed understanding and agreed to proceed. ? ?Follow Up Instructions: ? ?  ?I discussed the assessment and treatment plan with the patient. The patient was provided an opportunity to ask questions and all were answered. The patient agreed with the plan and demonstrated an understanding of the instructions. ?  ?The patient was advised to call back or seek an in-person evaluation if the symptoms worsen or if the condition fails to improve as anticipated. ? ?I provided 60 minutes of non-face-to-face time during this encounter. ? ? ?Royetta Crochet, [facility] ? ? ? ? ?Comprehensive Clinical Assessment (CCA) Note ? ?06/09/2021 ?Rebekah Silva ?[id number] ? ?Chief Complaint:  ?Chief Complaint  ?Patient presents with  ? Anxiety  ? Depression  ? Follow-up  ?  From inpt  ? ?Visit Diagnosis: MDD, GAD  ? ? ?CCA Screening, Triage and Referral (STR) ? ?Patient Reported Information ?How did you hear about [place]? Hospital Discharge ? ?Referral name: [facility] ? ?Referral phone number: No data recorded ? ?Whom do you see for routine medical problems? Primary Care ? ?Practice/Facility Name: [facility] ? ?Practice/Facility Phone Number: No data recorded ?Name of Contact: No data recorded ?Contact Number: No data recorded ?Contact Fax Number: No data recorded ?Prescriber Name: No data recorded ?Prescriber Address (if known): No data recorded ? ?What Is the Reason for Your Visit/Call Today? depression  anxiety SI- "near attempt" ? ?How Long Has This Been Causing You Problems? 1 wk - 1 month ? ?What Do You Feel Would  Help You the Most Today? Treatment for Depression or other mood problem ? ? ?Have You Recently Been in Any Inpatient Treatment (Hospital/Detox/Crisis Center/28-Day Program)? Yes ? ?Name/Location of Program/Hospital:[facility] ? ?How Long Were You There? 8 days ? ?When Were You Discharged? 06/02/21 ? ? ?Have You Ever Received Services From [employer] Before? Yes ? ?Who Do You See at [facility]? No data recorded ? ?Have You Recently Had Any Thoughts About Hurting Yourself? Yes (not at this moment; last time 1 week ago; specific SI; trying to use sheet in [facility]) ? ?Are You Planning to Commit Suicide/Harm Yourself At This time? No ? ? ?Have you Recently Had Thoughts About [place]? No ? ?Explanation: No data recorded ? ?Have You Used Any Alcohol or Drugs in the Past 24 Hours? No ? ?How Long Ago Did You Use Drugs or Alcohol? No data recorded ?What Did You Use and How Much? No data recorded ? ?Do You Currently Have a Therapist/Psychiatrist? Yes ? ?Name of Therapist/Psychiatrist: Virtual: Wants in person for individual ? ? ?Have You Been Recently Discharged From Any Office Practice or Programs? No ? ?Explanation of Discharge From Practice/Program: No data recorded ? ?  ?CCA Screening Triage Referral Assessment ?Type of Contact: Tele-Assessment ? ?Is this Initial or Reassessment? Initial Assessment ? ?Date Telepsych consult ordered in [facility]:  No data recorded ?Time Telepsych consult ordered in [facility]:  No data recorded ? ?Patient Reported Information Reviewed? No data recorded ?Patient Left Without Being Seen? No data recorded ?Reason for Not Completing Assessment: No data recorded ? ?Collateral Involvement: notes ? ? ?  Does Patient Have a Stage manager Guardian? No data recorded ?Name and Contact of Legal Guardian: No data recorded ?If Minor and Not Living with Parent(s), Who has Custody? No data recorded ?Is CPS involved or ever been involved? Never ? ?Is APS involved or ever been involved? Never ? ? ?Patient  Determined To Be At Risk for Harm To Self or Others Based on Review of Patient Reported Information or Presenting Complaint? No ? ?Method: No data recorded ?Availability of Means: No data recorded ?Intent: No data recorded ?Notification Required: No data recorded ?Additional Information for Danger to Others Potential: No data recorded ?Additional Comments for Danger to Others Potential: No data recorded ?Are There Guns or Other Weapons in [place]? No data recorded ?Types of Guns/Weapons: No data recorded ?Are These Weapons Safely Secured?                            No data recorded ?Who Could Verify You Are Able To Have These Secured: No data recorded ?Do You Have any Outstanding Charges, Pending Court Dates, Parole/Probation? No data recorded ?Contacted To Inform of Risk of Harm To Self or Others: No data recorded ? ?Location of Assessment: Other (comment) ? ? ?Does Patient Present under Involuntary Commitment? No ? ?IVC Papers Initial File Date: No data recorded ? ?[place] of Residence: [place] ? ? ?Patient Currently Receiving the Following Services: Individual Therapy ? ? ?Determination of Need: Urgent (48 hours) ? ? ?Options For Referral: Partial Hospitalization ? ? ? ? ?CCA Biopsychosocial ?Intake/Chief Complaint:  Pt reports for PHP per inpatient stepdown. Pt reports stressors: 1) Anxiety, Mental Health; SI: recent inpt stay 2) Job: Applying for new jobs. 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 Pt reports principal had ?a negative relationship with the prior theater teacher and I feel like he has been looking for a reason for me to screw up.? 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 People know me. I have a good reputation.? Pt reports she took the kids on a fieldtrip and incidents got magnified and principal was ready to take to HR over them.  ?I felt like I was a kid trying to please her parent. And there was no connection.? Pt reports ?teaching has been my passion for me for so long.? 4) ?My daughter is trans. She is 15.? ?She is who she is and I will never turn away from that. My parents have called me a bad parent for allowing her to be herself. I feel like I?ve lost my Mom though she?s not gone.? ?We weren?t allowed to come down for Christmas.? Pt reports treatment history includes 2 years of therapy between 2016-2018 and 2 visits with a virtual therapist this year; and 1 hospitalization March 2023 for SI with plan of running car off road. Pt denies suicide attempts. Pt reports continued passive SI and denies HI/AVH. Pt reports self-harm acts of pulling hair and biting nails until her fingers bleed. Pt reports medical diagnosis include Ulcerative Colitis and requires infusions for treatment; pt reports surgery for rectal tear 4x last year. Pt reports family history includes mother with MDD and panic disorder. ? ?Current Symptoms/Problems: increased anxiety; racing thoughts; SI; avoidance; hopelessness; worthlessness; heart racing; picking hair and chewing nails; 30lb weight loss; decreased appetite; interrupted sleep; decreased  sleep; decreased ADLs (?I don?t shower when I should. That?s embarrassing. I didn?t shower the whole time I was in the hospital. I showered the second day after I got home.? ?I will go a week or 2 without showering at home. I still go to work.? Not brushing teeth regularly. Spouse and daughter help with cooking and cleaning.) anhedonia; work problems; poor concentration; decreased sexual interest ? ? ?Patient Reported Schizophrenia/Schizoaffective Diagnosis in Past: No ? ? ?Strengths: understands treatment options ? ?Preferences: to feel better ? ?Abilities: can attend and participate in treatment ? ? ?Type of Services Patient Feels are Needed: PHP ? ? ?Initial Clinical Notes/Concerns: No data recorded ? ?Mental Health  Symptoms ?Depression:   ?Change in energy/activity; Fatigue; Difficulty Concentrating; Hopelessness; Increase/decrease in appetite; Sleep (too much or little); Tearfulness; Worthlessness ?  ?Duration of Depressive

## 2021-06-10 ENCOUNTER — Ambulatory Visit (HOSPITAL_COMMUNITY): Payer: BC Managed Care – PPO

## 2021-06-10 ENCOUNTER — Other Ambulatory Visit (HOSPITAL_COMMUNITY): Payer: BC Managed Care – PPO

## 2021-06-13 ENCOUNTER — Encounter (HOSPITAL_COMMUNITY): Payer: Self-pay

## 2021-06-13 ENCOUNTER — Other Ambulatory Visit (HOSPITAL_COMMUNITY): Payer: BC Managed Care – PPO | Attending: Psychiatry | Admitting: Professional

## 2021-06-13 ENCOUNTER — Other Ambulatory Visit (HOSPITAL_COMMUNITY): Payer: BC Managed Care – PPO

## 2021-06-13 DIAGNOSIS — F411 Generalized anxiety disorder: Secondary | ICD-10-CM | POA: Diagnosis not present

## 2021-06-13 DIAGNOSIS — F332 Major depressive disorder, recurrent severe without psychotic features: Secondary | ICD-10-CM | POA: Diagnosis present

## 2021-06-13 DIAGNOSIS — R4589 Other symptoms and signs involving emotional state: Secondary | ICD-10-CM

## 2021-06-13 NOTE — Therapy (Signed)
[place] ?[facility] ?[place], [place], [address] ?Phone: [phone]   Fax:  [phone] ? ?Occupational Therapy Treatment ? ?Virtual Visit via Video Note ? ?I connected with Rebekah Silva on 06/13/21 at  8:00 AM EDT by a video enabled telemedicine application and verified that I am speaking with the correct person using two identifiers. ? ?Location: ?Patient: home ?Provider: office ?  ?I discussed the limitations of evaluation and management by telemedicine and the availability of in person appointments. The patient expressed understanding and agreed to proceed. ? ? ?I discussed the assessment and treatment plan with the patient. The patient was provided an opportunity to ask questions and all were answered. The patient agreed with the plan and demonstrated an understanding of the instructions. ?  ?The patient was advised to call back or seek an in-person evaluation if the symptoms worsen or if the condition fails to improve as anticipated. ? ?I provided 60 minutes of non-face-to-face time during this encounter. ? ? ?Brantley Stage, OT ? ? ? ?Patient Details  ?Name: Rebekah Silva ?MRN: [id number] ?Date of Birth: 01/10/1971 ?Referring Provider (OT): Lendon Colonel, PA ? ? ?Encounter Date: 06/13/2021 ? ? OT End of Session - 06/13/21 2013   ? ? Visit Number 2   ? Number of Visits 20   ? Date for OT Re-Evaluation 07/09/21   ? Authorization Type --   BCBS in process of obtaining authorization information  ? OT Start Time 1100   ? OT Stop Time 1200   ? OT Time Calculation (min) 60 min   ? Activity Tolerance Patient tolerated treatment well   ? Behavior During Therapy [facility] for tasks assessed/performed   ? ?  ?  ? ?  ?BCBS 1/1-12/31/2023  ?25.00 due toward bill  ?Deduct [phone] met  ?OOP [id number]  ?No Copay no Co Ins  ?No Auth Required  ?Blue E Portal  ? ?Past Medical History:  ?Diagnosis Date  ? Arthritis   ? ra  ? Colitis   ? GERD  (gastroesophageal reflux disease)   ? Hypercholesterolemia   ? DR. WHITE  ? Migraines   ? PIH (pregnancy induced hypertension) yrs ago, none since  ? PONV (postoperative nausea and vomiting)   ? Wears glasses   ? ? ?Past Surgical History:  ?Procedure Laterality Date  ? ANAL FISSURE REPAIR N/A 04/09/2015  ? Procedure: ANAL CHEMICAL DENERVATION(BOTOX);  Surgeon: ARalene Ok MD;  Location: [facility]  Service: General;  Laterality: N/A;  ? ANAL FISSURE REPAIR  07/06/2016  ? CATARACT EXTRACTION Left   ? CESAREAN SECTION  2007  ? COLONOSCOPY    ? INTRAUTERINE DEVICE INSERTION  12/15/2005  ? MOUTH SURGERY    ? RECTAL EXAM UNDER ANESTHESIA N/A 02/17/2015  ? Procedure: RECTAL EXAM UNDER ANESTHESIA;  Surgeon: ARalene Ok MD;  [facility]: [facility];  Service: General;  Laterality: N/A;  ? RETINAL DETACHMENT SURGERY  2019  ? X 5   ? SPHINCTEROTOMY N/A 02/17/2015  ? Procedure: LATERAL INTERNAL SPHINCTEROTOMY;  Surgeon: ARalene Ok MD;  Location: [facility];  Service: General;  Laterality: N/A;  ? ? ?There were no vitals filed for this visit. ? ? Subjective Assessment - 06/13/21 2011   ? ? 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 Currently in Pain? No/denies   ? Pain Score 0-No  pain   ? Multiple Pain Sites No   ? ?  ?  ? ?  ? ? ? ?Subjective: During today's telehealth session, Rebekah Silva reported feeling overwhelmed with her daily tasks and feeling anxious about not being able to complete them. 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  ? ?Objective: In today's occupational therapy group, the topic of systems and goals was covered. The objective of the group was to educate patients on the importance of setting and achieving goals in their daily lives, and how  utilizing systems can aid in this process. The therapist led a discussion on the benefits of having a routine, breaking tasks down into smaller, achievable goals, and prioritizing tasks based on importance. The therapist also provided examples of different systems that can be used to aid in goal setting and task management, such as utilizing a planner or task list app. ? ?It was emphasized that having a sense of accomplishment and control over one's daily tasks can improve mood and overall mental health. Patients were encouraged to try implementing these strategies in their daily lives and to share their experiences and successes with the group during future sessions. ? ?Assessment: Based on today's group, Rebekah Silva may benefit from further assistance in implementing these strategies and utilizing systems to aid in his goal setting and task management. ? ?Plan: The therapist will follow up with pt to provide additional resources and support for implementing these strategies into her daily routine. The therapist will also encourage pt to continue attending group sessions to share her progress and receive feedback and support from her peers. ? ? ? ? ? ? ? ? ? ? ? ? ? ? ? ? ? ? ? OT Education - 06/13/21 2013   ? ? Education Details systems and goals / SMART goal setting   ? Person(s) Educated Patient   ? Methods Explanation;Demonstration   ? Comprehension Verbalized understanding   ? ?  ?  ? ?  ? ? ? OT Short Term Goals - 06/09/21 1247   ? ?  ? OT SHORT TERM GOAL #1  ? Title Pt will apply psychosocial skills and coping mechanisms to daily activities in order to function independently and reintegrate into community.   ? Time 4   ? Period Weeks   ? Status New   ? Target Date 07/09/21   ?  ? OT SHORT TERM GOAL #2  ? Title Patient will actively engage in OT group sessions throughout duration of PHP programming, in order to promote daily structure, social engagement, and opportunities to develop and utilize adaptive strategies  to maximize functional performance in preparation for safe transition and integration back into school, work, and community living.   ? Time 4   ? Period Weeks   ? Status New   ?  ? OT SHORT TERM GOAL #3  ? Title Patient will be educated and independent on strategies to improve communication skills and making requests of others.   ? Time 4   ? Period Weeks   ? Status New   ? ?  ?  ? ?  ? ? ? ? ? ? ? ? ? ? ? Plan - 06/13/21 2016   ? ? Clinical Impression Statement Pt verbally engaged / participated in OT group w/ therapist and peers.   ?  Occupational performance deficits (Please refer to evaluation for details): IADL's;Social Participation;Work;Rest and Sleep   ? Psychosocial Skills Coping Strategies;Routines and Behaviors;Interpersonal Interaction;Habits   ? Rehab Potential Good   ? Clinical Decision Making Multiple treatment options, significant modification of task necessary   ? Comorbidities Affecting Occupational Performance: None   ? OT Frequency 5x / week   ? OT Duration 4 weeks   ? OT Treatment/Interventions Self-care/ADL training;Psychosocial skills training;Coping strategies training   ? Plan Skilled OT intervention 5 times per week for 4 weeks in order to improve independence with ADLs, pyschosocial, and coping skills in order to return to prior level of function and reintegrate into community.  Next group focus to be assertive communication.   ? Consulted and Agree with Plan of Care Patient   ? ?  ?  ? ?  ? ? ?Patient will benefit from skilled therapeutic intervention in order to improve the following deficits and impairments:   ?  ?  ?Psychosocial Skills: Coping Strategies, Routines and Behaviors, Interpersonal Interaction, Habits ? ? ?Visit Diagnosis: ?Difficulty coping ? ? ? ?Problem List ?Patient Active Problem List  ? Diagnosis Date Noted  ? Generalized anxiety disorder 06/06/2021  ? MDD (major depressive disorder), recurrent severe, without psychosis (HCC) 05/25/2021  ? IUD (intrauterine device) in  place 01/21/2016  ? Steroid long-term use 08/20/2012  ? Ulcerative colitis (HCC) 08/20/2012  ? Hypercholesterolemia 01/13/2011  ? ? ?Edward G Hollan, OT ?06/13/2021, 8:20 PM ?Edward Hollan, OT  ? ?[place] ?[facility]

## 2021-06-13 NOTE — Patient Instructions (Signed)
It was emphasized that having a sense of accomplishment and control over one's daily tasks can improve mood and overall mental health. Patients were encouraged to try implementing these strategies in their daily lives and to share their experiences and successes with the group during future sessions. Cornell Barman, OT  ?

## 2021-06-14 ENCOUNTER — Encounter (HOSPITAL_COMMUNITY): Payer: Self-pay

## 2021-06-14 ENCOUNTER — Other Ambulatory Visit (HOSPITAL_COMMUNITY): Payer: BC Managed Care – PPO | Admitting: Professional

## 2021-06-14 ENCOUNTER — Other Ambulatory Visit (HOSPITAL_COMMUNITY): Payer: BC Managed Care – PPO

## 2021-06-14 DIAGNOSIS — R4589 Other symptoms and signs involving emotional state: Secondary | ICD-10-CM

## 2021-06-14 DIAGNOSIS — F332 Major depressive disorder, recurrent severe without psychotic features: Secondary | ICD-10-CM

## 2021-06-14 DIAGNOSIS — F321 Major depressive disorder, single episode, moderate: Secondary | ICD-10-CM

## 2021-06-14 DIAGNOSIS — F411 Generalized anxiety disorder: Secondary | ICD-10-CM

## 2021-06-14 NOTE — Therapy (Signed)
[place] ?[facility]GRAM ?[place], [place], [address] ?Phone: [phone]   Fax:  [phone] ? ?Occupational Therapy Treatment ? ?Virtual Visit via Video Note ? ?I connected with Rebekah Silva on 06/14/21 at  8:00 AM EDT by a video enabled telemedicine application and verified that I am speaking with the correct person using two identifiers. ? ?Location: ?Patient: home ?Provider: office ?  ?I discussed the limitations of evaluation and management by telemedicine and the availability of in person appointments. The patient expressed understanding and agreed to proceed. ? ?  ?The patient was advised to call back or seek an in-person evaluation if the symptoms worsen or if the condition fails to improve as anticipated. ? ?I provided 60 minutes of non-face-to-face time during this encounter.  ? ?Patient Details  ?Name: Rebekah Silva ?MRN: [id number] ?Date of Birth: 1971-03-01 ?Referring Provider (OT): Lendon Colonel, PA ? ? ?Encounter Date: 06/14/2021 ? ? OT End of Session - 06/14/21 1250   ? ? Visit Number 3   ? Number of Visits 20   ? Date for OT Re-Evaluation 07/09/21   ? Authorization Type BCBS 1/1-12/31/2023   25.00 due toward bill   Deduct [id number] met   OOP [id number]   No Copay no Co Ins   No Auth Required   Blue E Portal   ? OT Start Time 1100   ? OT Stop Time 1200   ? OT Time Calculation (min) 60 min   ? Activity Tolerance Patient tolerated treatment well   ? [facility]   ? ?  ?  ? ?  ? ? ?Past Medical History:  ?Diagnosis Date  ? Anxiety   ? Arthritis   ? ra  ? Colitis   ? Depression   ? GERD (gastroesophageal reflux disease)   ? Hypercholesterolemia   ? DR. WHITE  ? Migraines   ? PIH (pregnancy induced hypertension) yrs ago, none since  ? PONV (postoperative nausea and vomiting)   ? Wears glasses   ? ? ?Past Surgical History:  ?Procedure Laterality Date  ? ANAL FISSURE REPAIR N/A 04/09/2015  ?  Procedure: ANAL CHEMICAL DENERVATION(BOTOX);  Surgeon: ARalene Ok MD;  [place] [facility]  Service: General;  Laterality: N/A;  ? ANAL FISSURE REPAIR  07/06/2016  ? CATARACT EXTRACTION Left   ? CESAREAN SECTION  2007  ? COLONOSCOPY    ? INTRAUTERINE DEVICE INSERTION  12/15/2005  ? MOUTH SURGERY    ? RECTAL EXAM UNDER ANESTHESIA N/A 02/17/2015  ? Procedure: RECTAL EXAM UNDER ANESTHESIA;  Surgeon: ARalene Ok MD;  [facility];  Service: General;  Laterality: N/A;  ? RETINAL DETACHMENT SURGERY  2019  ? X 5   ? SPHINCTEROTOMY N/A 02/17/2015  ? Procedure: LATERAL INTERNAL SPHINCTEROTOMY;  Surgeon: ARalene Ok MD;  [facility];  Service: General;  Laterality: N/A;  ? ? ?There were no vitals filed for this visit. ? ? Subjective Assessment - 06/14/21 1249   ? ? Currently in Pain? No/denies   ? Pain Score 0-No pain   ? ?  ?  ? ?  ? ? ? ?Group Session: ? ?S: "I would like to get more information on this topic in a PDF format so I am able to refer to it as I need it" ? ?O:  In today's occupational therapy group, the topic of systems and goals was covered. The objective of the group was to educate patients  on the importance of setting and achieving goals in their daily lives, and how utilizing systems can aid in this process. The therapist led a discussion on the benefits of having a routine, breaking tasks down into smaller, achievable goals, and prioritizing tasks based on importance. The therapist also provided examples of different systems that can be used to aid in goal setting and task management, such as utilizing a planner or task list app. ? ?It was emphasized that having a sense of accomplishment and control over one's daily tasks can improve mood and overall mental health. Patients were encouraged to try implementing these strategies in their daily lives and to share their experiences and successes with the group during future sessions. ? ?A: Based on today's group, Rebekah Silva may benefit from  further assistance in implementing these strategies and utilizing systems to aid in her goal setting and task management. ? ?P: Continue to attend PHP OT group sessions 5x week for 4 weeks to promote daily structure, social engagement, and opportunities to develop and utilize adaptive strategies to maximize functional performance in preparation for safe transition and integration back into school, work, and the community. Plan to address topic of routines in next OT group session.  ? ? ? ? ? ? ? ? ? ? ? ? ? ? ? ? ? ? ? OT Education - 06/14/21 1250   ? ? Education Details systems and goals / SMART goal setting   ? Person(s) Educated Patient   ? Methods Explanation;Demonstration   ? Comprehension Verbalized understanding   ? ?  ?  ? ?  ? ? ? OT Short Term Goals - 06/09/21 1247   ? ?  ? OT SHORT TERM GOAL #1  ? Title Pt will apply psychosocial skills and coping mechanisms to daily activities in order to function independently and reintegrate into community.   ? Time 4   ? Period Weeks   ? Status New   ? Target Date 07/09/21   ?  ? OT SHORT TERM GOAL #2  ? Title Patient will actively engage in OT group sessions throughout duration of PHP programming, in order to promote daily structure, social engagement, and opportunities to develop and utilize adaptive strategies to maximize functional performance in preparation for safe transition and integration back into school, work, and community living.   ? Time 4   ? Period Weeks   ? Status New   ?  ? OT SHORT TERM GOAL #3  ? Title Patient will be educated and independent on strategies to improve communication skills and making requests of others.   ? Time 4   ? Period Weeks   ? Status New   ? ?  ?  ? ?  ? ? ? ? ? ? ? ? ? ? ? Plan - 06/14/21 1251   ? ? Psychosocial Skills Coping Strategies;Routines and Behaviors;Interpersonal Interaction;Habits   ? ?  ?  ? ?  ? ? ?Patient will benefit from skilled therapeutic intervention in order to improve the following deficits and  impairments:   ?  ?  ?Psychosocial Skills: Coping Strategies, Routines and Behaviors, Interpersonal Interaction, Habits ? ? ?Visit Diagnosis: ?Difficulty coping ? ? ? ?Problem List ?Patient Active Problem List  ? Diagnosis Date Noted  ? Generalized anxiety disorder 06/06/2021  ? MDD (major depressive disorder), recurrent severe, without psychosis ([place]) 05/25/2021  ? IUD (intrauterine device) in place 01/21/2016  ? Steroid long-term use 08/20/2012  ? Ulcerative colitis ([place]) 08/20/2012  ?  Hypercholesterolemia 01/13/2011  ? ? ?Brantley Stage, OT ?06/14/2021, 12:56 PM ? ?Cornell Barman, OT  ? ?[place] ?BE[facility]PITAL[facility]GRAM ?[place], [place], [address] ?Phone: [phone]   Fax:  [phone] ? ?Name: Rebekah Silva ?MRN: [id number] ?Date of Birth: [birth year] ? ?

## 2021-06-14 NOTE — Progress Notes (Signed)
Cln met with pt at the start of group in a breakout room per her request. Pt stated she would like her return-to-work documentation to state that she is clear to return to work with accommodations, with those accommodations being that she cannot be in the classroom. 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 She was tearful while discussing. She also stated that she is being considered for a potential promotion and that one of her friends and coworkers informed her that she is showing signs of PTSD from the encounter with the principal and if she were to have accommodations to prevent her from being in the classroom, her promotion would likely be offered when she returns to work. Cln informed pt that we cannot accommodate this request because it is unethical and illegal. Pt continued to be tearful and stated, "It's not really a promotion. 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 Cln recommended pt discuss concerns regarding the principal with HR and process those feelings in group and therapy and to actively work on assertiveness and conflict resolution. Cln also pointed out that the principal may not realize how harsh he came across, or he could have been projecting his own frustrations onto her after being berated by parents. 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 Pt was receptive and verbalized understanding. ?

## 2021-06-14 NOTE — Progress Notes (Signed)
Spoke with patient via Webex video call, used 2 identifiers to correctly identify patient. States this is her first time in [facility] after being at [facility] for 8 days inpatient. Was dealing with depression with suicidal thoughts. She had a plan to crash her car into a tree. No longer feeling suicidal but does not want to return to work where her stress is. Her boss was belittling and called her a liar questioning her integrity. She states that she is in an overly negative work environment. Began crying when talking about work. 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 Enjoying groups so far and feels they are beneficial to her. On scale 1-10 as 10 being worst she rates depression at 7 and anxiety at 5. Denies SI/HI and AV hallucinations. PHQ9=18. No side effects from medication. No issues or complaints.  ?

## 2021-06-14 NOTE — Psych (Signed)
Virtual Visit via Video Note ? ?I connected with Rebekah Silva on 06/13/21 at  9:00 AM EDT by a video enabled telemedicine application and verified that I am speaking with the correct person using two identifiers. ? ?Location: ?Patient: home ?Provider: clinical home office ?  ?I discussed the limitations of evaluation and management by telemedicine and the availability of in person appointments. The patient expressed understanding and agreed to proceed. ? ?Follow Up Instructions: ? ?  ?I discussed the assessment and treatment plan with the patient. The patient was provided an opportunity to ask questions and all were answered. The patient agreed with the plan and demonstrated an understanding of the instructions. ?  ?The patient was advised to call back or seek an in-person evaluation if the symptoms worsen or if the condition fails to improve as anticipated. ? ?I provided 240 minutes of non-face-to-face time during this encounter. ? ? ?Royetta Crochet, [facility] ? ? ?[facility] PHP THERAPIST PROGRESS NOTE ? ?Rebekah Silva ?[id number] ? ?Session Time: 9-10 ? ?Participation Level: Active ? ?Behavioral Response: CasualAlertAnxious and Depressed ? ?Type of Therapy: Group Therapy ? ?Treatment Goals addressed: Coping ? ?Progress Towards Goals: Progressing ? ?Interventions: CBT, DBT, Solution Focused, Strength-based, Supportive, and Reframing ? ?Summary: Clinician led check-in regarding current stressors and situation. Clinician utilized active listening and empathetic response and validated patient emotions. Clinician facilitated processing group on pertinent issues. ? ?Therapist Response: Rebekah Silva is a 51 y.o. female who presents with depression and anxiety symptoms. Patient arrived within time allowed and reports that she is feeling "ok." Patient rates her mood at a 6 on a scale of 1-10 with 10 being great. Pt reports she saw the [employer] movie with friends and felt good returning to a movie  theater for the first time since the pandemic started. Pt reports she spoke with a friend who is also a Librarian, academic about work concerns and feels better. Patient able to process. Patient engaged in discussion. ?  ?  ? ?Session Time: 10:00- 11:00 ?  ?Participation Level: Active ?  ?Behavioral Response: CasualAlertDepressed ?  ?Type of Therapy: Group Therapy ?  ?Treatment Goals addressed: Coping ? ?Progress Towards Goals: Progressing ?  ?Interventions: CBT, DBT, Supportive, Reframing, and Strengths Based ?  ?Summary: Clinician led the group in discussing the distraction and grounding techniques. Pt's identified and shared techniques that they believe are helpful to them currently and got ideas from others and cln for ideas to try. ?  ?Therapist Response: Patient engaged in group. Patient reports she likes using 5-4-3-2-1 and making lists for distractions. ?  ?  ?Session Time: 11:00 -12:00 ?  ?Participation Level: Active ?  ?Behavioral Response: CasualAlertDepressed ?  ?Type of Therapy: Group Therapy, Occupational Therapy ?  ?Treatment Goals addressed: Coping ? ?Progress Towards Goals: Progressing ?  ?Interventions: Supportive, Education ?  ?Summary:  Occupational Therapist, Tiburcio Bash, lead group. ?  ?Therapist Response: See OT note. ?  ?  ?  ?Session Time: 12:00 -1:00 ?  ?Participation Level: Active ?  ?Behavioral Response: CasualAlertDepressed ?  ?Type of Therapy: Group therapy ?  ?Treatment Goals addressed: Coping ? ?Progress Towards Goals: Progressing ?  ?Interventions: CBT; Solution focused; Supportive; Reframing ?  ?Summary: 12:00 - 12:50: Clinician introduced topic of "Positive Psychology". Group watched "Positive Psychology" Ted-Talk. Patients discussed how their "lens" of life effects the way they feel.  ?12:50 -1:00 Clinician led check-out. Clinician assessed for immediate needs, medication compliance and efficacy, and safety concerns ?  ?  Therapist Response: 12:00 - 12:50: Pt engaged in discussion and  reports relating to the cult of the average and moving the goal posts of success and happiness. ?12:50 - 1:00: At check-out, patient rates her mood at a 6 on a scale of 1-10 with 10 being great. Pt reports she has been motivated to decorate for Easter and will spend time with her family. Patient demonstrates some progress as evidenced by increased insight. Patient denies SI/HI/self-harm at the end of group. ? ? ? ?Suicidal/Homicidal: Nowithout intent/plan ? ?Plan: Pt will continue in PHP and medication management while continuing to work on decreasing depression symptoms, SI, and increasing the ability to self manage symptoms. ? ?Collaboration of Care: None ? ?Patient/Guardian was advised Release of Information must be obtained prior to any record release in order to collaborate their care with an outside provider. Patient/Guardian was advised if they have not already done so to contact the registration department to sign all necessary forms in order for [place] to release information regarding their care.  ? ?Consent: Patient/Guardian gives verbal consent for treatment and assignment of benefits for services provided during this visit. Patient/Guardian expressed understanding and agreed to proceed.  ? ?Diagnosis: MDD (major depressive disorder), recurrent severe, without psychosis ([place]) [F33.2] ?   ?1. MDD (major depressive disorder), recurrent severe, without psychosis ([place])   ?2. Generalized anxiety disorder   ? ? ? ? ?Royetta Crochet, [facility] ?06/13/2021 ? ?

## 2021-06-15 ENCOUNTER — Other Ambulatory Visit (HOSPITAL_COMMUNITY): Payer: BC Managed Care – PPO | Admitting: Professional

## 2021-06-15 ENCOUNTER — Encounter (HOSPITAL_COMMUNITY): Payer: Self-pay

## 2021-06-15 ENCOUNTER — Other Ambulatory Visit (HOSPITAL_COMMUNITY): Payer: BC Managed Care – PPO

## 2021-06-15 ENCOUNTER — Other Ambulatory Visit: Payer: Self-pay | Admitting: Nurse Practitioner

## 2021-06-15 ENCOUNTER — Ambulatory Visit (INDEPENDENT_AMBULATORY_CARE_PROVIDER_SITE_OTHER): Payer: BC Managed Care – PPO

## 2021-06-15 DIAGNOSIS — F411 Generalized anxiety disorder: Secondary | ICD-10-CM

## 2021-06-15 DIAGNOSIS — M85851 Other specified disorders of bone density and structure, right thigh: Secondary | ICD-10-CM | POA: Diagnosis not present

## 2021-06-15 DIAGNOSIS — R4589 Other symptoms and signs involving emotional state: Secondary | ICD-10-CM

## 2021-06-15 DIAGNOSIS — Z780 Asymptomatic menopausal state: Secondary | ICD-10-CM

## 2021-06-15 DIAGNOSIS — Z7952 Long term (current) use of systemic steroids: Secondary | ICD-10-CM

## 2021-06-15 DIAGNOSIS — F332 Major depressive disorder, recurrent severe without psychotic features: Secondary | ICD-10-CM

## 2021-06-15 DIAGNOSIS — Z13820 Encounter for screening for osteoporosis: Secondary | ICD-10-CM
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
## 2021-06-15 NOTE — Addendum Note (Signed)
Addended by: Derrill Center on: 06/15/2021 02:05 PM ? ? Modules accepted: Orders ? ?

## 2021-06-15 NOTE — Therapy (Signed)
[facility] ?BE[facility]AVIOR[facility]IAL [facility]GRAM ?[place], [place], [address] ?Phone: [phone]   Fax:  [phone] ? ?Occupational Therapy Treatment ? ?Virtual Visit via Video Note ? ?I connected with Tracie Harrier Cincotta on 06/15/21 at  8:00 AM EDT by a video enabled telemedicine application and verified that I am speaking with the correct person using two identifiers. ? ?Location: ?Patient: home ?Provider: office ?  ?I discussed the limitations of evaluation and management by telemedicine and the availability of in person appointments. The patient expressed understanding and agreed to proceed. ? ?  ?The patient was advised to call back or seek an in-person evaluation if the symptoms worsen or if the condition fails to improve as anticipated. ? ?I provided 50 minutes of non-face-to-face time during this encounter.  ? ?Patient Details  ?Name: Rebekah Silva ?MRN: [id number] ?Date of Birth: [birth year] ?Referring Provider (OT): Lendon Colonel, PA ? ? ?Encounter Date: 06/15/2021 ? ? OT End of Session - 06/15/21 1428   ? ? Visit Number 4   ? Number of Visits 20   ? Date for OT Re-Evaluation 07/09/21   ? Authorization Type BCBS 1/1-12/31/2023   25.00 due toward bill   Deduct [id number] met   OOP [id number]   No Copay no Co Ins   No Auth Required   Blue E Portal   ? OT Start Time 1200   ? OT Stop Time 1250   ? OT Time Calculation (min) 50 min   ? Activity Tolerance Patient tolerated treatment well   ? ?  ?  ? ?  ? ? ?Past Medical History:  ?Diagnosis Date  ? Anxiety   ? Arthritis   ? ra  ? Colitis   ? Depression   ? GERD (gastroesophageal reflux disease)   ? Hypercholesterolemia   ? DR. WHITE  ? Migraines   ? PIH (pregnancy induced hypertension) yrs ago, none since  ? PONV (postoperative nausea and vomiting)   ? Wears glasses   ? ? ?Past Surgical History:  ?Procedure Laterality Date  ? ANAL FISSURE REPAIR N/A 04/09/2015  ? Procedure: ANAL CHEMICAL DENERVATION(BOTOX);  Surgeon:  ARalene Ok MD;  Location: [place]  Service: General;  Laterality: N/A;  ? ANAL FISSURE REPAIR  07/06/2016  ? CATARACT EXTRACTION Left   ? CESAREAN SECTION  2007  ? COLONOSCOPY    ? INTRAUTERINE DEVICE INSERTION  12/15/2005  ? MOUTH SURGERY    ? RECTAL EXAM UNDER ANESTHESIA N/A 02/17/2015  ? Procedure: RECTAL EXAM UNDER ANESTHESIA;  Surgeon: ARalene Ok MD;  [facility];  Service: General;  Laterality: N/A;  ? RETINAL DETACHMENT SURGERY  2019  ? X 5   ? SPHINCTEROTOMY N/A 02/17/2015  ? Procedure: LATERAL INTERNAL SPHINCTEROTOMY;  Surgeon: ARalene Ok MD;  [facility];  Service: General;  Laterality: N/A;  ? ? ?There were no vitals filed for this visit. ? ? Subjective Assessment - 06/15/21 1427   ? ? Currently in Pain? No/denies   ? Pain Score 0-No pain   ? Multiple Pain Sites No   ? ?  ?  ? ?  ? ? ? ?Group Session: ? ?S: Patient reported feeling anxious and depressed during the initial session. In previous sessions, Pt has learned various strategies on Systems and Goals, and today, she was able to identify multiple areas of her personal and work life that have benefited from the use of routines. Pt expressed interest in implementing new strategies to implement  and adhere to routines going forward. Patient shared that she has implemented a new system to improve her Rx mgmt and adherence w/ the use of apps / alarms to remind her to take Rx on time, specifically in the evening.  ? ?O: Pt tolerated the telehealth session well. She was able to engage in a conversation about the benefits of routines and how they have helped her manage those depression and anxiety symptoms that interfere w/ her daily life.  ? ?A: The patient continues to struggle with depression and anxiety, but has demonstrated improvement through the use of routines. She is motivated to continue implementing newly learned strategies to improve her mental health and overall quality of life. ? ?P: Continue to attend PHP OT group  sessions 5x week for 4 weeks to promote daily structure, social engagement, and opportunities to develop and utilize adaptive strategies to maximize functional performance in preparation for safe transition and integration back into school, work, and the community. Plan to address topic of Routines / part 2 in next OT group session. The next session will focus on continuing the discussion on the power of routines and implementing new strategies to manage depression and anxiety symptoms. The patient will be encouraged to continue practicing the routines and strategies learned in previous sessions. ? ? ? ? ? ? ? ? ? ? ? ? ? ? ? ? ? ? ? ? OT Education - 06/15/21 1428   ? ? Education Details The Power of Routines   ? Person(s) Educated Patient   ? Methods Explanation;Demonstration   ? Comprehension Verbalized understanding   ? ?  ?  ? ?  ? ? ? OT Short Term Goals - 06/09/21 1247   ? ?  ? OT SHORT TERM GOAL #1  ? Title Pt will apply psychosocial skills and coping mechanisms to daily activities in order to function independently and reintegrate into community.   ? Time 4   ? Period Weeks   ? Status New   ? Target Date 07/09/21   ?  ? OT SHORT TERM GOAL #2  ? Title Patient will actively engage in OT group sessions throughout duration of PHP programming, in order to promote daily structure, social engagement, and opportunities to develop and utilize adaptive strategies to maximize functional performance in preparation for safe transition and integration back into school, work, and community living.   ? Time 4   ? Period Weeks   ? Status New   ?  ? OT SHORT TERM GOAL #3  ? Title Patient will be educated and independent on strategies to improve communication skills and making requests of others.   ? Time 4   ? Period Weeks   ? Status New   ? ?  ?  ? ?  ? ? ? ? ? ? ? ? ? ? ? Plan - 06/15/21 1429   ? ? OT Occupational Profile and History --   ? Psychosocial Skills Coping Strategies;Routines and Behaviors;Interpersonal  Interaction;Habits   ? ?  ?  ? ?  ? ? ?Patient will benefit from skilled therapeutic intervention in order to improve the following deficits and impairments:   ?  ?  ?Psychosocial Skills: Coping Strategies, Routines and Behaviors, Interpersonal Interaction, Habits ? ? ?Visit Diagnosis: ?Difficulty coping ? ? ? ?Problem List ?Patient Active Problem List  ? Diagnosis Date Noted  ? Generalized anxiety disorder 06/06/2021  ? MDD (major depressive disorder), recurrent severe, without psychosis ([place]) 05/25/2021  ? IUD (  intrauterine device) in place 01/21/2016  ? Steroid long-term use 08/20/2012  ? Ulcerative colitis ([place]) 08/20/2012  ? Hypercholesterolemia 01/13/2011  ? ? ?Brantley Stage, OT ?06/15/2021, 2:39 PM ?Cornell Barman, OT ? ? ?[facility] ?[facility] PARTIAL [facility] ?[place], [place], [address] ?Phone: [phone]   Fax:  [phone] ? ?Name: Dorena Dorfman Kobayashi ?MRN: [id number] ?Date of Birth: [birth year] ? ?

## 2021-06-16 ENCOUNTER — Ambulatory Visit (HOSPITAL_COMMUNITY): Payer: BC Managed Care – PPO

## 2021-06-16 ENCOUNTER — Telehealth (HOSPITAL_COMMUNITY): Payer: Self-pay | Admitting: Family Medicine

## 2021-06-16 ENCOUNTER — Other Ambulatory Visit (HOSPITAL_COMMUNITY): Payer: BC Managed Care – PPO

## 2021-06-16 NOTE — BH Assessment (Signed)
Care Management - Follow Up [place] Discharges  ? ?Patient has been placed in an inpatient psychiatric hospital ([place]) on 05-25-2021.  ?

## 2021-06-17 ENCOUNTER — Encounter (HOSPITAL_COMMUNITY): Payer: Self-pay

## 2021-06-17 ENCOUNTER — Other Ambulatory Visit (HOSPITAL_COMMUNITY): Payer: BC Managed Care – PPO

## 2021-06-17 ENCOUNTER — Other Ambulatory Visit (HOSPITAL_COMMUNITY): Payer: BC Managed Care – PPO | Admitting: Licensed Clinical Social Worker

## 2021-06-17 DIAGNOSIS — F332 Major depressive disorder, recurrent severe without psychotic features: Secondary | ICD-10-CM

## 2021-06-17 DIAGNOSIS — R4589 Other symptoms and signs involving emotional state: Secondary | ICD-10-CM

## 2021-06-17 DIAGNOSIS — F411 Generalized anxiety disorder: Secondary | ICD-10-CM

## 2021-06-17 NOTE — Psych (Signed)
Virtual Visit via Video Note ? ?I connected with Rebekah Harrier Simmerman on 06/17/21 at  9:00 AM EDT by a video enabled telemedicine application and verified that I am speaking with the correct person using two identifiers. ? ?Location: ?Patient: pt's home ?Provider: clinical office ?  ?I discussed the limitations of evaluation and management by telemedicine and the availability of in person appointments. The patient expressed understanding and agreed to proceed. ?  ?I discussed the assessment and treatment plan with the patient. The patient was provided an opportunity to ask questions and all were answered. The patient agreed with the plan and demonstrated an understanding of the instructions. ?  ?The patient was advised to call back or seek an in-person evaluation if the symptoms worsen or if the condition fails to improve as anticipated. ? ?I provided 240 minutes of non-face-to-face time during this encounter. ? ? ?Heron Nay, LCSWA ? ? ?[facility] PHP THERAPIST PROGRESS NOTE ? ?Rebekah Silva ?[id number] ? ? ?Session Time: 9:00 am - 10:00 am ? ?Participation Level: Active ? ?Behavioral Response: CasualAlertAnxious and Depressed ? ?Type of Therapy: Group Therapy ? ?Treatment Goals addressed: Coping ? ?Progress Towards Goals: Progressing ? ?Interventions: CBT, DBT, Solution Focused, Strength-based, Supportive, and Reframing ? ?Therapist Response: Clinician led check-in regarding current stressors and situation, and review of patient completed daily inventory. Clinician utilized active listening and empathetic response and validated patient emotions. Clinician facilitated processing group on pertinent issues.?  ? ?Summary: Rebekah Silva is a 51yo female who presents with anxiety and depression symptoms. Patient arrived within time allowed. Patient rates her mood at a 7/8 on a scale of 1-10 with 10 being best. 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 She reports some  interrupted sleep last night and endorses feelings of letting people down and feeling apologetic for things that are outside of her control. She states she woke up with those thoughts. Met with group (moms of trans kids) yesterday and feels guilty that she didn?t share her struggles with mental health. Appetite yesterday was good, ate three meals. Denied SI/SH. Wants to process feeling apologetic. Pt denied experiencing SI/SH thoughts since last session. Pt able to process.?Pt engaged in discussion.?  ?  ? ? ?Session Time: 10:00 am - 11:00 am ? ?Participation Level: Active ? ?Behavioral Response: CasualAlertAnxious and Depressed ? ?Type of Therapy: Group Therapy ? ?Treatment Goals addressed: Coping ? ?Progress Towards Goals: Progressing ? ?Interventions: CBT, DBT, Solution Focused, Strength-based, Supportive, and Reframing ? ?Therapist Response: Clinician led group on feelings related to attempting mindfulness as well as guilt related to distancing ourselves from family members. Clinician utilized CBT principles to inform discussion.  ? ?Summary: Pt engaged in discussion. Pt reports she harbors self-doubt due to how she was raised. She also reports she can relate to the guilt associated with separating oneself from dysfunctional family members. ? ? ? ?Session Time: 11:00 am - 12:00 pm ? ?Participation Level: Active ? ?Behavioral Response: CasualAlertAnxious and Depressed ? ?Type of Therapy: Group Therapy ? ?Treatment Goals addressed: Coping ? ?Progress Towards Goals: Progressing ? ?Interventions: CBT, DBT, Solution Focused, Strength-based, Supportive, and Reframing ? ?Therapist Response: Cln led group on body image. Patients were educated about body image and asked to think about whether they have a healthy or unhealthy body image. Patients were led in a discussion about factors that contribute to body image, both internal and external. Patients were asked to discuss strengths of the human body outside of  appearance,  such as being able to fight off diseases and provide stress relief. Discussion was also held around how self-image is impacted by striving for perfection. Cln utilized CBT principals to inform discussion.  ? ?Summary: Pt engaged in discussion and reports she previously struggled more with her body image before she had her daughter. She reports she still feels self-conscious when feeling judged by others. Pt was receptive to feedback regarding finding ways to love one's body by focusing on strengths of the human body.  ? ? ?Session Time: 12:00 pm - 1:00 pm ? ?Participation Level: Active ? ?Behavioral Response: CasualAlertAnxious and Depressed ? ?Type of Therapy: Group Therapy ? ?Treatment Goals addressed: Coping ? ?Progress Towards Goals: Progressing ? ?Interventions: CBT, DBT, Solution Focused, Strength-based, Supportive, and Reframing ? ?Therapist Response: 12:00 - 12:50 pm: See OT note. 12:50 - 1:00 pm: Clinician led check-out. Clinician assessed for immediate needs, medication compliance and efficacy, and safety concerns? ? ?Summary: 12:00 - 12:50 pm: See OT note. 12:50 - 1:00 pm: At check-out, patient rates her mood at a 7/8 on a scale of 1-10 with 10 being great. Pt reports planning to help a friend run errands and spend time with family over the weekend. Patient demonstrates progress as evidenced by continued engagement and openness regarding emotions and stressors. . Patient denies SI/HI/self-harm thoughts at the end of group and agrees to seek help should those thoughts/feelings occur.?  ? ?Suicidal/Homicidal: Nowithout intent/plan ? ?Plan: ?Pt will continue in PHP and medication management while continuing to work on decreasing depression symptoms,?SI, and anxiety symptoms,?and increasing the ability to self manage symptoms.  ? ? ?Collaboration of Care: Medication Management AEB Ricky Ala, NP ? ?Patient/Guardian was advised Release of Information must be obtained prior to any record release  in order to collaborate their care with an outside provider. Patient/Guardian was advised if they have not already done so to contact the registration department to sign all necessary forms in order for [place] to release information regarding their care.  ? ?Consent: Patient/Guardian gives verbal consent for treatment and assignment of benefits for services provided during this visit. Patient/Guardian expressed understanding and agreed to proceed.  ? ?Diagnosis: MDD (major depressive disorder), recurrent severe, without psychosis ([place]) [F33.2] ?   ?1. MDD (major depressive disorder), recurrent severe, without psychosis ([place])   ?2. Generalized anxiety disorder   ? ? ? ? ?Heron Nay, LCSWA ?06/17/2021 ? ?

## 2021-06-17 NOTE — Therapy (Signed)
[place] ?[facility] ?[place], [place], [address] ?Phone: [phone]   Fax:  [phone] ? ?Occupational Therapy Treatment ? ?Virtual Visit via Video Note ? ?I connected with Tracie Harrier Ingwersen on 06/17/21 at  8:00 AM EDT by a video enabled telemedicine application and verified that I am speaking with the correct person using two identifiers. ? ?Location: ?Patient: home ?Provider: office ?  ?I discussed the limitations of evaluation and management by telemedicine and the availability of in person appointments. The patient expressed understanding and agreed to proceed. ? ?  ?The patient was advised to call back or seek an in-person evaluation if the symptoms worsen or if the condition fails to improve as anticipated. ? ?I provided 50 minutes of non-face-to-face time during this encounter. ? ? ?Patient Details  ?Name: Rebekah Silva ?MRN: [id number] ?Date of Birth: 1970/11/19 ?Referring Provider (OT): Lendon Colonel, PA ? ? ?Encounter Date: 06/17/2021 ? ? OT End of Session - 06/17/21 1607   ? ? Visit Number 5   ? Number of Visits 20   ? Date for OT Re-Evaluation 07/09/21   ? Authorization Type BCBS 1/1-12/31/2023   25.00 due toward bill   Deduct [id number] met   OOP [id number]   No Copay no Co Ins   No Auth Required   Blue E Portal   ? OT Start Time 1200   ? OT Stop Time 1250   ? OT Time Calculation (min) 50 min   ? ?  ?  ? ?  ? ? ?Past Medical History:  ?Diagnosis Date  ? Anxiety   ? Arthritis   ? ra  ? Colitis   ? Depression   ? GERD (gastroesophageal reflux disease)   ? Hypercholesterolemia   ? DR. WHITE  ? Migraines   ? PIH (pregnancy induced hypertension) yrs ago, none since  ? PONV (postoperative nausea and vomiting)   ? Wears glasses   ? ? ?Past Surgical History:  ?Procedure Laterality Date  ? ANAL FISSURE REPAIR N/A 04/09/2015  ? Procedure: ANAL CHEMICAL DENERVATION(BOTOX);  Surgeon: ARalene Ok MD;  [facility]  Service: General;   Laterality: N/A;  ? ANAL FISSURE REPAIR  07/06/2016  ? CATARACT EXTRACTION Left   ? CESAREAN SECTION  2007  ? COLONOSCOPY    ? INTRAUTERINE DEVICE INSERTION  12/15/2005  ? MOUTH SURGERY    ? RECTAL EXAM UNDER ANESTHESIA N/A 02/17/2015  ? Procedure: RECTAL EXAM UNDER ANESTHESIA;  Surgeon: ARalene Ok MD;  [facility];  Service: General;  Laterality: N/A;  ? RETINAL DETACHMENT SURGERY  2019  ? X 5   ? SPHINCTEROTOMY N/A 02/17/2015  ? Procedure: LATERAL INTERNAL SPHINCTEROTOMY;  Surgeon: ARalene Ok MD;  Location: [facility];  Service: General;  Laterality: N/A;  ? ? ?There were no vitals filed for this visit. ? ? Subjective Assessment - 06/17/21 1606   ? ? Currently in Pain? No/denies   ? Pain Score 0-No pain   ? ?  ?  ? ?  ? ? ? ? ? ? ?Group Session: ? ?S: "I really resonated with some of what I learned today, specifically the point of having trouble doing self care for the reason that it's so difficult to get out of bed, just getting started. I have neglected oral care for awhile and have now created a system of having floss near my bed so when I am having one of those difficult mornings and I can't seem  to get out of bed, I will defer to the floss I have nearby." ? ?O: In today's occupational therapy group, the topic of routines was addressed. The objective of the group was to educate patients on the importance of establishing a routine and how it can positively impact mental health. The therapist led a discussion on the benefits of having a consistent routine, such as reducing stress, increasing productivity, and improving overall mental health. The therapist also provided examples of different ways to establish a routine, such as setting a regular sleep schedule, scheduling time for self-care activities, and creating a daily task list. ? ?It was emphasized that establishing a routine may take time and effort, but the benefits can be significant. Patients were encouraged to start small and gradually  build up to a more structured routine, as well as to be flexible and adjust as needed. ? ?A:  Based on today's group, it appears that Laiana could benefit from implementing a more consistent routine in her daily life to help manage her symptoms of anxiety and depression in order to improve her ADL performance and overall quality of life.  ? ?P: Continue to attend PHP OT group sessions 5x week for 4 weeks to promote daily structure, social engagement, and opportunities to develop and utilize adaptive strategies to maximize functional performance in preparation for safe transition and integration back into school, work, and the community. Plan to address topic of reciprocity in next OT group session. ? ? ? ? ? ? ? ? ? ? ? ? ? ? ? ? ? OT Education - 06/17/21 1606   ? ? Education Details The Power of Routines   ? Person(s) Educated Patient   ? Methods Explanation;Demonstration   ? Comprehension Verbalized understanding   ? ?  ?  ? ?  ? ? ? OT Short Term Goals - 06/09/21 1247   ? ?  ? OT SHORT TERM GOAL #1  ? Title Pt will apply psychosocial skills and coping mechanisms to daily activities in order to function independently and reintegrate into community.   ? Time 4   ? Period Weeks   ? Status New   ? Target Date 07/09/21   ?  ? OT SHORT TERM GOAL #2  ? Title Patient will actively engage in OT group sessions throughout duration of PHP programming, in order to promote daily structure, social engagement, and opportunities to develop and utilize adaptive strategies to maximize functional performance in preparation for safe transition and integration back into school, work, and community living.   ? Time 4   ? Period Weeks   ? Status New   ?  ? OT SHORT TERM GOAL #3  ? Title Patient will be educated and independent on strategies to improve communication skills and making requests of others.   ? Time 4   ? Period Weeks   ? Status New   ? ?  ?  ? ?  ? ? ? ? ? ? ? ? ? ? ? Plan - 06/17/21 1608   ? ? Psychosocial Skills Coping  Strategies;Routines and Behaviors;Interpersonal Interaction;Habits   ? ?  ?  ? ?  ? ? ?Patient will benefit from skilled therapeutic intervention in order to improve the following deficits and impairments:   ?  ?  ?Psychosocial Skills: Coping Strategies, Routines and Behaviors, Interpersonal Interaction, Habits ? ? ?Visit Diagnosis: ?Difficulty coping ? ? ? ?Problem List ?Patient Active Problem List  ? Diagnosis Date Noted  ? Generalized  anxiety disorder 06/06/2021  ? MDD (major depressive disorder), recurrent severe, without psychosis ([place]) 05/25/2021  ? IUD (intrauterine device) in place 01/21/2016  ? Steroid long-term use 08/20/2012  ? Ulcerative colitis ([place]) 08/20/2012  ? Hypercholesterolemia 01/13/2011  ? ? ?Brantley Stage, OT ?06/17/2021, 4:09 PM ? ? ?[facility] ?[place], [place], [address] ?Phone: [phone]   Fax:  [phone] ? ?Name: Arah Aro Bihm ?MRN: [id number] ?Date of Birth: 28-Jul-1970 ? ?

## 2021-06-20 ENCOUNTER — Other Ambulatory Visit (HOSPITAL_COMMUNITY): Payer: BC Managed Care – PPO | Admitting: Licensed Clinical Social Worker

## 2021-06-20 ENCOUNTER — Encounter (HOSPITAL_COMMUNITY): Payer: Self-pay

## 2021-06-20 ENCOUNTER — Other Ambulatory Visit (HOSPITAL_COMMUNITY): Payer: BC Managed Care – PPO

## 2021-06-20 DIAGNOSIS — F332 Major depressive disorder, recurrent severe without psychotic features: Secondary | ICD-10-CM

## 2021-06-20 DIAGNOSIS — F411 Generalized anxiety disorder: Secondary | ICD-10-CM

## 2021-06-20 DIAGNOSIS — R4589 Other symptoms and signs involving emotional state: Secondary | ICD-10-CM

## 2021-06-20 NOTE — Therapy (Signed)
[facility] ?[facility] ?[place], [place], [address] ?Phone: [phone]   Fax:  [phone] ? ?Occupational Therapy Treatment ? ?Virtual Visit via Video Note ? ?I connected with Rebekah Silva on 06/20/21 at  8:00 AM EDT by a video enabled telemedicine application and verified that I am speaking with the correct person using two identifiers. ? ?Location: ?Patient: home ?Provider: office ?  ?I discussed the limitations of evaluation and management by telemedicine and the availability of in person appointments. The patient expressed understanding and agreed to proceed. ? ?  ?The patient was advised to call back or seek an in-person evaluation if the symptoms worsen or if the condition fails to improve as anticipated. ? ?I provided 60 minutes of non-face-to-face time during this encounter. ? ? ?Patient Details  ?Name: Rebekah Silva ?MRN: [id number] ?Date of Birth: 29-Jun-1970 ?Referring Provider (OT): Lendon Colonel, PA ? ? ?Encounter Date: 06/20/2021 ? ? OT End of Session - 06/20/21 1234   ? ? Visit Number 6   ? Number of Visits 20   ? Date for OT Re-Evaluation 07/09/21   ? Authorization Type [employer] 1/1-12/31/2023   25.00 due toward bill   Deduct 1250-754.08 met   OOP [id number]   No Copay no Co Ins   No Auth Required   Blue E Portal   ? OT Start Time 1100   ? OT Stop Time 1200   ? OT Time Calculation (min) 60 min   ? ?  ?  ? ?  ? ? ?Past Medical History:  ?Diagnosis Date  ? Anxiety   ? Arthritis   ? ra  ? Colitis   ? Depression   ? GERD (gastroesophageal reflux disease)   ? Hypercholesterolemia   ? DR. WHITE  ? Migraines   ? PIH (pregnancy induced hypertension) yrs ago, none since  ? PONV (postoperative nausea and vomiting)   ? Wears glasses   ? ? ?Past Surgical History:  ?Procedure Laterality Date  ? ANAL FISSURE REPAIR N/A 04/09/2015  ? Procedure: ANAL CHEMICAL DENERVATION(BOTOX);  Surgeon: ARalene Ok MD;  [facility]  Service: General;   Laterality: N/A;  ? ANAL FISSURE REPAIR  07/06/2016  ? CATARACT EXTRACTION Left   ? CESAREAN SECTION  2007  ? COLONOSCOPY    ? INTRAUTERINE DEVICE INSERTION  12/15/2005  ? MOUTH SURGERY    ? RECTAL EXAM UNDER ANESTHESIA N/A 02/17/2015  ? Procedure: RECTAL EXAM UNDER ANESTHESIA;  Surgeon: ARalene Ok MD;  [facility];  Service: General;  Laterality: N/A;  ? RETINAL DETACHMENT SURGERY  2019  ? X 5   ? SPHINCTEROTOMY N/A 02/17/2015  ? Procedure: LATERAL INTERNAL SPHINCTEROTOMY;  Surgeon: ARalene Ok MD;  [facility];  Service: General;  Laterality: N/A;  ? ? ?There were no vitals filed for this visit. ? ? Subjective Assessment - 06/20/21 1233   ? ? Currently in Pain? No/denies   ? Pain Score 0-No pain   ? ?  ?  ? ?  ? ? ?Group Session: ? ?S: Rebekah Silva attended virtual group occupational therapy session focused on the topic of reciprocity and fairness. She reported feeling anxious and stressed due to recent personal and work-related conflicts. During the session, she actively participated in the discussion and shared personal experiences related to the topic. She appeared to benefit from the group discussion and expressed interest in learning more about the topic. ? ?O: During the session, LTaceydemonstrated good attention  and engagement with the group. She shared personal experiences and perspectives on the topic and listened actively to other participants. Her body language was open and relaxed during the discussion. ? ?A: The virtual group occupational therapy session focused on reciprocity and fairness was beneficial for Rebekah Silva in terms of exploring personal experiences and perspectives on the topic. She appeared interested in learning more about the topic and developing strategies to apply the concepts in her daily life. ? ?P: Continue to attend PHP OT group sessions 5x week for 4 weeks to promote daily structure, social engagement, and opportunities to develop and utilize adaptive strategies to  maximize functional performance in preparation for safe transition and integration back into school, work, and the community. Plan to address topic of reciprocity part 2 in next OT group session. ? ? ? ? ? ? ? ? ? ? ? ? ? ? ? ? ? ? ? ? ? OT Education - 06/20/21 1233   ? ? Education Details The Power of Reciprocity in Daily Routines   ? Person(s) Educated Patient   ? Methods Explanation;Demonstration   ? Comprehension Verbalized understanding   ? ?  ?  ? ?  ? ? ? OT Short Term Goals - 06/09/21 1247   ? ?  ? OT SHORT TERM GOAL #1  ? Title Pt will apply psychosocial skills and coping mechanisms to daily activities in order to function independently and reintegrate into community.   ? Time 4   ? Period Weeks   ? Status New   ? Target Date 07/09/21   ?  ? OT SHORT TERM GOAL #2  ? Title Patient will actively engage in OT group sessions throughout duration of PHP programming, in order to promote daily structure, social engagement, and opportunities to develop and utilize adaptive strategies to maximize functional performance in preparation for safe transition and integration back into school, work, and community living.   ? Time 4   ? Period Weeks   ? Status New   ?  ? OT SHORT TERM GOAL #3  ? Title Patient will be educated and independent on strategies to improve communication skills and making requests of others.   ? Time 4   ? Period Weeks   ? Status New   ? ?  ?  ? ?  ? ? ? ? ? ? ? ? ? ? ? Plan - 06/20/21 1234   ? ? Psychosocial Skills Coping Strategies;Routines and Behaviors;Interpersonal Interaction;Habits   ? ?  ?  ? ?  ? ? ?Patient will benefit from skilled therapeutic intervention in order to improve the following deficits and impairments:   ?  ?  ?Psychosocial Skills: Coping Strategies, Routines and Behaviors, Interpersonal Interaction, Habits ? ? ?Visit Diagnosis: ?Difficulty coping ? ? ? ?Problem List ?Patient Active Problem List  ? Diagnosis Date Noted  ? Generalized anxiety disorder 06/06/2021  ? MDD (major  depressive disorder), recurrent severe, without psychosis ([place]) 05/25/2021  ? IUD (intrauterine device) in place 01/21/2016  ? Steroid long-term use 08/20/2012  ? Ulcerative colitis ([place]) 08/20/2012  ? Hypercholesterolemia 01/13/2011  ? ? ?Brantley Stage, OT ?06/20/2021, 12:35 PM ? ?[facility] ?[facility] ?[place], [place], [address] ?Phone: [phone]   Fax:  [phone] ? ?Name: Rebekah Silva ?MRN: [id number] ?Date of Birth: Apr 24, 1970 ? ?

## 2021-06-21 ENCOUNTER — Other Ambulatory Visit (HOSPITAL_COMMUNITY): Payer: BC Managed Care – PPO

## 2021-06-21 ENCOUNTER — Ambulatory Visit (HOSPITAL_COMMUNITY): Payer: BC Managed Care – PPO

## 2021-06-22 ENCOUNTER — Encounter (HOSPITAL_COMMUNITY): Payer: Self-pay

## 2021-06-22 ENCOUNTER — Other Ambulatory Visit (HOSPITAL_COMMUNITY): Payer: BC Managed Care – PPO

## 2021-06-22 ENCOUNTER — Other Ambulatory Visit (HOSPITAL_COMMUNITY): Payer: BC Managed Care – PPO | Admitting: Licensed Clinical Social Worker

## 2021-06-22 ENCOUNTER — Encounter (HOSPITAL_COMMUNITY): Payer: Self-pay | Admitting: Family

## 2021-06-22 DIAGNOSIS — R4589 Other symptoms and signs involving emotional state: Secondary | ICD-10-CM

## 2021-06-22 DIAGNOSIS — F411 Generalized anxiety disorder: Secondary | ICD-10-CM

## 2021-06-22 DIAGNOSIS — F332 Major depressive disorder, recurrent severe without psychotic features: Secondary | ICD-10-CM | POA: Diagnosis not present

## 2021-06-22 MED ORDER — QUETIAPINE FUMARATE 25 MG PO TABS: 25.0000 mg | ORAL_TABLET | Freq: Three times a day (TID) | ORAL | 0 refills | Status: DC

## 2021-06-22 MED ORDER — GABAPENTIN 300 MG PO CAPS: 300.0000 mg | ORAL_CAPSULE | Freq: Three times a day (TID) | ORAL | 0 refills | Status: AC

## 2021-06-22 NOTE — Progress Notes (Signed)
Spoke with patient via Webex video call, used 2 identifiers to correctly identify patient. States that groups are going well, enjoys hearing similar stories and that she isn't alone in her thoughts. Had a very irritable day yesterday and had some fleeting SI but was able to bring herself back to reality. Her family was getting on her nerves and fighting but it was resolved. Denies SI or HI today. Denies AV hallucinations. On scale ;1-10 as 10 being worst she rates depression at 4 and anxiety at 7. No side effects from medications. No issues or complaints.

## 2021-06-22 NOTE — Progress Notes (Signed)
BH MD/PA/NP OP Progress Note ? ?06/22/2021 3:18 PM ?Rebekah Silva  ?MRN:  [id number] ? ?Chief Complaint: Ongoing anxiety and symptoms of worry ? ?HPI: Rebekah Silva was seen and evaluated via WebEx.  She is awake alert and oriented x3.  Denying suicidal or homicidal ideations.  Denies auditory visual hallucinations.  Reports ongoing symptoms of worry related to reentering the workforce.  States she continues to struggle if this is her right career choice for her currently.   ? ?States she has been placing her energy into the LGBT community. "  I just want to help people, and not felt like people are watching me are out to get me."  She reports a good appetite.  States she is resting well throughout the night.  States she has been medication compliant without medication side effects.  Patient to continue partial hospitalization programming.  We will make medication refills available.  Support,  encouragement and reassurance was provided. ? ? ?Visit Diagnosis:  ?  ICD-10-CM   ?1. MDD (major depressive disorder), recurrent severe, without psychosis ([place])  F33.2   ?  ?2. Generalized anxiety disorder  F41.1   ?  ? ? ?Past Psychiatric History:  ? ?Past Medical History:  ?Past Medical History:  ?Diagnosis Date  ? Anxiety   ? Arthritis   ? ra  ? Colitis   ? Depression   ? GERD (gastroesophageal reflux disease)   ? Hypercholesterolemia   ? DR. WHITE  ? Migraines   ? PIH (pregnancy induced hypertension) yrs ago, none since  ? PONV (postoperative nausea and vomiting)   ? Wears glasses   ?  ?Past Surgical History:  ?Procedure Laterality Date  ? ANAL FISSURE REPAIR N/A 04/09/2015  ? Procedure: ANAL CHEMICAL DENERVATION(BOTOX);  Surgeon: Ralene Ok, MD;  Location: [facility];  Service: General;  Laterality: N/A;  ? ANAL FISSURE REPAIR  07/06/2016  ? CATARACT EXTRACTION Left   ? CESAREAN SECTION  2007  ? COLONOSCOPY    ? INTRAUTERINE DEVICE INSERTION  12/15/2005  ? MOUTH SURGERY    ? RECTAL EXAM UNDER ANESTHESIA N/A 02/17/2015   ? Procedure: RECTAL EXAM UNDER ANESTHESIA;  Surgeon: Ralene Ok, MD;  [facility];  Service: General;  Laterality: N/A;  ? RETINAL DETACHMENT SURGERY  2019  ? X 5   ? SPHINCTEROTOMY N/A 02/17/2015  ? Procedure: LATERAL INTERNAL SPHINCTEROTOMY;  Surgeon: Ralene Ok, MD;  [facility];  Service: General;  Laterality: N/A;  ? ? ?Family Psychiatric History:  ? ?Family History:  ?Family History  ?Problem Relation Age of Onset  ? Anxiety disorder Mother   ? Depression Mother   ? Breast cancer Mother   ?     mastectomy- all through the ducts of left breast   ? Hypertension Father   ? Breast cancer Maternal Aunt   ? Cancer Maternal Grandfather   ?     colon?  ? Diabetes Paternal Grandmother   ? Heart disease Paternal Grandmother   ? ? ?Social History:  ?Social History  ? ?Socioeconomic History  ? Marital status: Married  ?  Spouse name: Not on file  ? Number of children: 1  ? Years of education: Not on file  ? Highest education level: Bachelor's degree (e.g., BA, AB, BS)  ?Occupational History  ? Not on file  ?Tobacco Use  ? Smoking status: Never  ? Smokeless tobacco: Never  ?Vaping Use  ? Vaping Use: Never used  ?Substance and Sexual Activity  ? Alcohol use:  Yes  ?  Alcohol/week: 0.0 standard drinks  ?  Comment: occ  ? Drug use: Yes  ?  Types: Marijuana  ? Sexual activity: Yes  ?  Partners: Male  ?  Birth control/protection: I.U.D.  ?  Comment: 1st intercourse- 79, partners- 15, married- 54 yrs   ?Other Topics Concern  ? Not on file  ?Social History Narrative  ? Not on file  ? ?Social Determinants of Health  ? ?Financial Resource Strain: Not on file  ?Food Insecurity: Not on file  ?Transportation Needs: Not on file  ?Physical Activity: Not on file  ?Stress: Not on file  ?Social Connections: Not on file  ? ? ?Allergies:  ?Allergies  ?Allergen Reactions  ? Aspirin Other (See Comments)  ?  Ulcerative colitis, abdominal pain  ? Ceftin [Cefuroxime] Other (See Comments)  ?  Diarrhea, abdominal cramping  ?  Codeine Other (See Comments)  ?  Severe headaches  ? Lactose Other (See Comments)  ?  Dairy - sets off my colitis  ? Other Rash and Other (See Comments)  ?  Contact metal agents   ? Penicillins Rash and Other (See Comments)  ?  Has patient had a PCN reaction causing immediate rash, facial/tongue/throat swelling, SOB or lightheadedness with hypotension: unknown ?Has patient had a PCN reaction causing severe rash involving mucus membranes or skin necrosis: unknown ?Has patient had a PCN reaction that required hospitalization unknown ?Has patient had a PCN reaction occurring within the last 10 years: unknown ?If all of the above answers are "NO", then may proceed with Cephalosporin use. ?  ? ? ?Metabolic Disorder Labs: ?Lab Results  ?Component Value Date  ? HGBA1C 5.1 05/25/2021  ? MPG 100 05/25/2021  ? ?Lab Results  ?Component Value Date  ? PROLACTIN 13.0 05/25/2021  ? ?Lab Results  ?Component Value Date  ? CHOL 220 (H) 05/25/2021  ? TRIG 132 05/25/2021  ? HDL 55 05/25/2021  ? CHOLHDL 4.0 05/25/2021  ? VLDL 26 05/25/2021  ? LDLCALC 139 (H) 05/25/2021  ? ?Lab Results  ?Component Value Date  ? TSH 1.514 05/25/2021  ? ? ?Therapeutic Level Labs: ?No results found for: LITHIUM ?No results found for: VALPROATE ?No components found for:  CBMZ ? ?Current Medications: ?Current Outpatient Medications  ?Medication Sig Dispense Refill  ? atorvastatin (LIPITOR) 20 MG tablet Take 20 mg by mouth at bedtime.    ? buPROPion (WELLBUTRIN XL) 150 MG 24 hr tablet Take 150 mg by mouth every morning.    ? Calcium Carbonate-Vitamin D 600-400 MG-UNIT tablet Take 1 tablet by mouth at bedtime.    ? cetirizine (ZYRTEC) 10 MG tablet Take 10 mg by mouth daily.    ? DULoxetine (CYMBALTA) 60 MG capsule Take 1 capsule (60 mg total) by mouth daily. 30 capsule 0  ? gabapentin (NEURONTIN) 300 MG capsule Take 1 capsule (300 mg total) by mouth 3 (three) times daily. 90 capsule 0  ? hydrOXYzine (ATARAX) 25 MG tablet Take 1 tablet (25 mg total) by mouth 3  (three) times daily as needed for anxiety. 30 tablet 0  ? latanoprost (XALATAN) 0.005 % ophthalmic solution Place 1 drop into both eyes at bedtime.    ? mesalamine (LIALDA) 1.2 G EC tablet Take 4.8 g by mouth daily.    ? Multiple Vitamin (MULTIVITAMIN WITH MINERALS) TABS tablet Take 1 tablet by mouth daily.    ? promethazine (PHENERGAN) 50 MG suppository Place 50 mg rectally every 6 (six) hours as needed for nausea or vomiting.    ?  QUEtiapine (SEROQUEL) 25 MG tablet Take 1 tablet (25 mg total) by mouth 3 (three) times daily. 90 tablet 0  ? rizatriptan (MAXALT) 10 MG tablet Take 10 mg by mouth as needed for migraine. May repeat in 2 hours if needed    ? topiramate (TOPAMAX) 50 MG tablet Take 1 tablet (50 mg total) by mouth every 12 (twelve) hours. 60 tablet 0  ? ?Current Facility-Administered Medications  ?Medication Dose Route Frequency Provider Last Rate Last Admin  ? levonorgestrel (MIRENA) 20 MCG/24HR IUD   Intrauterine Once Terrance Mass, MD      ? ? ? ?Musculoskeletal: ?Strength & Muscle Tone: within normal limits ?Gait & Station: normal ?Patient leans: N/A ? ?Psychiatric Specialty Exam: ?Review of Systems  ?Eyes: Negative.   ?Respiratory: Negative.    ?Cardiovascular: Negative.   ?Musculoskeletal: Negative.   ?Skin: Negative.   ?Psychiatric/Behavioral:  The patient is nervous/anxious.   ?All other systems reviewed and are negative.  ?There were no vitals taken for this visit.There is no height or weight on file to calculate BMI.  ?General Appearance: Casual  ?Eye Contact:  Good  ?Speech:  Clear and Coherent  ?Volume:  Normal  ?Mood:  Anxious and Depressed  ?Affect:  Congruent  ?Thought Process:  Coherent  ?Orientation:  Full (Time, Place, and Person)  ?Thought Content: Logical   ?Suicidal Thoughts:  No  ?Homicidal Thoughts:  No  ?Memory:  Immediate;   Fair ?Recent;   Fair  ?Judgement:  Fair  ?Insight:  Fair  ?Psychomotor Activity:  Normal  ?Concentration:  Concentration: Good  ?Recall:  Good  ?Fund of  Knowledge: Good  ?Language: Good  ?Akathisia:  No  ?Handed:  Right  ?AIMS (if indicated): done  ?Assets:  Communication Skills ?Desire for Improvement ?Social Support  ?ADL's:  Intact  ?Cognition: WNL  ?Sleep

## 2021-06-22 NOTE — Plan of Care (Signed)
Pt agrees with treatment plan. ?

## 2021-06-22 NOTE — Therapy (Signed)
[facility] ?[facility] ?[place], [place], [address] ?Phone: [phone]   Fax:  [phone] ? ?Occupational Therapy Treatment ? ?Virtual Visit via Video Note ? ?I connected with Rebekah Silva on 06/22/21 at  8:00 AM EDT by a video enabled telemedicine application and verified that I am speaking with the correct person using two identifiers. ? ?Location: ?Patient: home ?Provider: office ?  ?I discussed the limitations of evaluation and management by telemedicine and the availability of in person appointments. The patient expressed understanding and agreed to proceed. ? ?  ?The patient was advised to call back or seek an in-person evaluation if the symptoms worsen or if the condition fails to improve as anticipated. ? ?I provided 50 minutes of non-face-to-face time during this encounter. ? ? ?Patient Details  ?Name: Rebekah Silva ?MRN: [id number] ?Date of Birth: 1970/03/26 ?Referring Provider (OT): Lendon Colonel, PA ? ? ?Encounter Date: 06/22/2021 ? ? OT End of Session - 06/22/21 1310   ? ? Visit Number 7   ? Number of Visits 20   ? Date for OT Re-Evaluation 07/09/21   ? Authorization Type BCBS 1/1-12/31/2023   25.00 due toward bill   Deduct [id number] met   OOP [id number]   No Copay no Co Ins   No Auth Required   Blue E Portal   ? OT Start Time 1200   ? OT Stop Time 1250   ? OT Time Calculation (min) 50 min   ? ?  ?  ? ?  ? ? ?Past Medical History:  ?Diagnosis Date  ? Anxiety   ? Arthritis   ? ra  ? Colitis   ? Depression   ? GERD (gastroesophageal reflux disease)   ? Hypercholesterolemia   ? DR. WHITE  ? Migraines   ? PIH (pregnancy induced hypertension) yrs ago, none since  ? PONV (postoperative nausea and vomiting)   ? Wears glasses   ? ? ?Past Surgical History:  ?Procedure Laterality Date  ? ANAL FISSURE REPAIR N/A 04/09/2015  ? Procedure: ANAL CHEMICAL DENERVATION(BOTOX);  Surgeon: ARalene Ok MD;  [facility]  Service: General;   Laterality: N/A;  ? ANAL FISSURE REPAIR  07/06/2016  ? CATARACT EXTRACTION Left   ? CESAREAN SECTION  2007  ? COLONOSCOPY    ? INTRAUTERINE DEVICE INSERTION  12/15/2005  ? MOUTH SURGERY    ? RECTAL EXAM UNDER ANESTHESIA N/A 02/17/2015  ? Procedure: RECTAL EXAM UNDER ANESTHESIA;  Surgeon: ARalene Ok MD;  [facility];  Service: General;  Laterality: N/A;  ? RETINAL DETACHMENT SURGERY  2019  ? X 5   ? SPHINCTEROTOMY N/A 02/17/2015  ? Procedure: LATERAL INTERNAL SPHINCTEROTOMY;  Surgeon: ARalene Ok MD;  Location: [facility];  Service: General;  Laterality: N/A;  ? ? ?There were no vitals filed for this visit. ? ? Subjective Assessment - 06/22/21 1309   ? ? Currently in Pain? No/denies   ? Pain Score 0-No pain   ? ?  ?  ? ?  ? ? ? ? ? ?Group Session: ? ?S: Patient reported feeling highly engaged in the session focused on communication and forgiveness. Patient shared personal experiences related to past conflicts and how forgiveness played a role in resolving them. Patient also reported feeling more aware of their own communication patterns and how they might be impacting their relationships. ? ?O:  During the session, patient actively participated in reflective sharing their thoughts and feelings with the group.  Patient also engaged in group discussion and shared personal anecdotes related to the topic. ? ?A: Patient demonstrated strong insight into their communication patterns and expressed a desire to continue working on improving their ability to communicate effectively. Therapist will continue to encourage patient to practice reflective writing and to use strategies learned in the session to improve their communication skills. ? ?P: Continue to attend PHP OT group sessions 5x week for 4 weeks to promote daily structure, social engagement, and opportunities to develop and utilize adaptive strategies to maximize functional performance in preparation for safe transition and integration back into school,  work, and the community. Plan to address topic of communication / forgiveness, part 2 in next OT group session. ? ? ? ? ? ? ? ? ? ? ? ? ? ? ? ? ? ? OT Education - 06/22/21 1309   ? ? Education Community education officer and Forgiveness for Improved Relationships   ? Person(s) Educated Patient   ? Methods Explanation;Demonstration   ? Comprehension Verbalized understanding   ? ?  ?  ? ?  ? ? ? OT Short Term Goals - 06/09/21 1247   ? ?  ? OT SHORT TERM GOAL #1  ? Title Pt will apply psychosocial skills and coping mechanisms to daily activities in order to function independently and reintegrate into community.   ? Time 4   ? Period Weeks   ? Status New   ? Target Date 07/09/21   ?  ? OT SHORT TERM GOAL #2  ? Title Patient will actively engage in OT group sessions throughout duration of PHP programming, in order to promote daily structure, social engagement, and opportunities to develop and utilize adaptive strategies to maximize functional performance in preparation for safe transition and integration back into school, work, and community living.   ? Time 4   ? Period Weeks   ? Status New   ?  ? OT SHORT TERM GOAL #3  ? Title Patient will be educated and independent on strategies to improve communication skills and making requests of others.   ? Time 4   ? Period Weeks   ? Status New   ? ?  ?  ? ?  ? ? ? ? ? ? ? ? ? ? ? Plan - 06/22/21 1310   ? ? Psychosocial Skills Coping Strategies;Routines and Behaviors;Interpersonal Interaction;Habits   ? ?  ?  ? ?  ? ? ?Patient will benefit from skilled therapeutic intervention in order to improve the following deficits and impairments:   ?  ?  ?Psychosocial Skills: Coping Strategies, Routines and Behaviors, Interpersonal Interaction, Habits ? ? ?Visit Diagnosis: ?Difficulty coping ? ? ? ?Problem List ?Patient Active Problem List  ? Diagnosis Date Noted  ? Generalized anxiety disorder 06/06/2021  ? MDD (major depressive disorder), recurrent severe, without psychosis ([place])  05/25/2021  ? IUD (intrauterine device) in place 01/21/2016  ? Steroid long-term use 08/20/2012  ? Ulcerative colitis ([place]) 08/20/2012  ? Hypercholesterolemia 01/13/2011  ? ? ?Brantley Stage, OT ?06/22/2021, 1:11 PM ?Cornell Barman, OT ? ? ?[facility] ?[facility] ?[place], [place], [address] ?Phone: [phone]   Fax:  [phone] ? ?Name: Rebekah Silva ?MRN: [id number] ?Date of Birth: October 11, 1970 ? ?

## 2021-06-23 ENCOUNTER — Other Ambulatory Visit (HOSPITAL_COMMUNITY): Payer: BC Managed Care – PPO | Admitting: Licensed Clinical Social Worker

## 2021-06-23 ENCOUNTER — Other Ambulatory Visit (HOSPITAL_COMMUNITY): Payer: BC Managed Care – PPO

## 2021-06-23 ENCOUNTER — Encounter (HOSPITAL_COMMUNITY): Payer: Self-pay

## 2021-06-23 DIAGNOSIS — F332 Major depressive disorder, recurrent severe without psychotic features: Secondary | ICD-10-CM | POA: Diagnosis not present

## 2021-06-23 DIAGNOSIS — R4589 Other symptoms and signs involving emotional state: Secondary | ICD-10-CM

## 2021-06-23 DIAGNOSIS — F411 Generalized anxiety disorder: Secondary | ICD-10-CM

## 2021-06-23 NOTE — Psych (Signed)
Virtual Visit via Video Note ? ?I connected with Tracie Harrier Suderman on 06/24/2021 at  9:00 AM EDT by a video enabled telemedicine application and verified that I am speaking with the correct person using two identifiers. ? ?Location: ?Patient: pt's home ?Provider: clinical home office ?  ?I discussed the limitations of evaluation and management by telemedicine and the availability of in person appointments. The patient expressed understanding and agreed to proceed. ? ?  ?I discussed the assessment and treatment plan with the patient. The patient was provided an opportunity to ask questions and all were answered. The patient agreed with the plan and demonstrated an understanding of the instructions. ?  ?The patient was advised to call back or seek an in-person evaluation if the symptoms worsen or if the condition fails to improve as anticipated. ? ?I provided 240 minutes of non-face-to-face time during this encounter. ? ? ?Heron Nay, LCSWA ? ? ?[facility] PHP THERAPIST PROGRESS NOTE ? ?Tracie Harrier [place] ?[id number] ? ? ?Session Time: 9:00 am - 10:00 am ? ?Participation Level: Active ? ?Behavioral Response: CasualAlertAnxious and Depressed ? ?Type of Therapy: Group Therapy ? ?Treatment Goals addressed: Coping ? ?Progress Towards Goals: Progressing ? ?Interventions: CBT, DBT, Solution Focused, Strength-based, Supportive, and Reframing ? ?Therapist Response: Clinician led check-in regarding current stressors and situation, and review of patient completed daily inventory. Clinician utilized active listening and empathetic response and validated patient emotions. Clinician facilitated processing group on pertinent issues.?  ? ?Summary: Rebekah Silva is a 51yo female who presents with depression and anxiety symptoms. Patient arrived within time allowed. Patient rates her mood at a 7 on a scale of 1-10 with 10 being best. Pt reported, "little bit of anxiety this morning, but nothing terrible. My spouse went to my work to  get some of my personal things I had been missing. Just knowing that he's there in that environment. My therapist says I have PTSD right now." States she was recommended by a friend to talk to another parent about their trans child, and states this felt good. "I like being thought of as a safe person." When asked about sleep and appetite, pt reports she experienced some anxiety and racing thoughts last night and had to take medication, but endorses good sleep. She reports her appetite is "coming back finally." Pt denied experiencing SI/SH thoughts since last session.  Pt stated she does not have a particular topic she wants to discuss. Pt able to process.?Pt engaged in discussion.?  ?  ? ? ?Session Time: 10:00 am - 11:00 am ? ?Participation Level: Active ? ?Behavioral Response: CasualAlertAnxious and Depressed ? ?Type of Therapy: Group Therapy ? ?Treatment Goals addressed: Coping ? ?Progress Towards Goals: Progressing ? ?Interventions: CBT, DBT, Solution Focused, Strength-based, Supportive, and Reframing ? ?Therapist Response: Clinician led group on forgiveness. Clinician utilized CBT principles to inform discussion.  ? ?Summary: Pt engaged in discussion. Pt reports "It's hard to forgive when you don't see them change the behavior." Pt also reports she was able to find forgiveness for her father because he has changed. Pt also reports, "I don't get angry often and hold onto anger, but if someone were to speak out against my daughter with the intent of hurting her, that would be hard for me to forgive." ? ? ? ?Session Time: 11:00 am - 12:00 pm ? ?Participation Level: Active ? ?Behavioral Response: CasualAlertAnxious and Depressed ? ?Type of Therapy: Group Therapy ? ?Treatment Goals addressed: Coping ? ?Progress Towards Goals: Progressing ? ?Interventions:  CBT, DBT, Solution Focused, Strength-based, Supportive, and Reframing ? ?Therapist Response: Clinician led group on Cognitive Distortions. Clinician listed  different types of cognitive distortions and provided examples. Pts were asked to provide personal examples of cognitive distortions they currently experience or have previously experienced.  ? ?Summary: Pt states she relates to overgeneralizing and fortune-telling. ? ? ?Session Time: 12:00 pm - 1:00 pm ? ?Participation Level: Active ? ?Behavioral Response: CasualAlertAnxious and Depressed ? ?Type of Therapy: Group Therapy ? ?Treatment Goals addressed: Coping ? ?Progress Towards Goals: Progressing ? ?Interventions: CBT, DBT, Solution Focused, Strength-based, Supportive, and Reframing ? ?Therapist Response: 12:00 - 12:50 pm: Clinician continued group on cognitive distortions. Pts were asked to identify a specific cognitive distortion they experience and clinician asked questions to identify facts, beliefs, and feelings contributing to the distortion. Clinician utilized CBT principles to inform discussion. 12:50 - 1:00 pm: Clinician led check-out. Clinician assessed for immediate needs, medication compliance and efficacy, and safety concerns? ? ?Summary: 12:00 - 12:50 pm: Pt engaged in discussion. Pt reports "I am forever seeing only the negative." Reports that when she gets evaluations at work, she only focuses on negative feedback. Pt receptive to questions and reframing feedback from cln. 12:50 - 1:00 pm: At check-out, patient rates her mood at an 8 on a scale of 1-10 with 10 being great. Pt reports planning to spend time with her in-laws over the weekend for [place] Easter. Patient demonstrates progress as evidenced by her continued engagement and openness in discussing emotions and stressors. Patient denies SI/HI/self-harm thoughts at the end of group and agrees to seek help should those thoughts/feelings occur.?  ? ?Suicidal/Homicidal: Nowithout intent/plan ? ?Plan: ?Pt will continue in PHP and medication management while continuing to work on decreasing depression symptoms,?SI, and anxiety symptoms,?and  increasing the ability to self manage symptoms.  ?  ?Collaboration of Care: Medication Management AEB Ricky Ala, NP ? ?Patient/Guardian was advised Release of Information must be obtained prior to any record release in order to collaborate their care with an outside provider. Patient/Guardian was advised if they have not already done so to contact the registration department to sign all necessary forms in order for [place] to release information regarding their care.  ? ?Consent: Patient/Guardian gives verbal consent for treatment and assignment of benefits for services provided during this visit. Patient/Guardian expressed understanding and agreed to proceed.  ? ?Diagnosis: MDD (major depressive disorder), recurrent severe, without psychosis ([place]) [F33.2] ?   ?1. MDD (major depressive disorder), recurrent severe, without psychosis ([place])   ?2. Generalized anxiety disorder   ? ? ? ? ?Heron Nay, LCSWA ?06/24/2021 ? ?

## 2021-06-23 NOTE — Therapy (Signed)
[facility] ?[facility] ?[place], [place], [address] ?Phone: [phone]   Fax:  [phone] ? ?Occupational Therapy Treatment ? ?Virtual Visit via Video Note ? ?I connected with Tracie Harrier Granholm on 06/23/21 at  8:00 AM EDT by a video enabled telemedicine application and verified that I am speaking with the correct person using two identifiers. ? ?Location: ?Patient: home ?Provider: office ?  ?I discussed the limitations of evaluation and management by telemedicine and the availability of in person appointments. The patient expressed understanding and agreed to proceed. ? ?  ?The patient was advised to call back or seek an in-person evaluation if the symptoms worsen or if the condition fails to improve as anticipated. ? ?I provided 60 minutes of non-face-to-face time during this encounter. ? ? ?Patient Details  ?Name: Rebekah Silva ?MRN: [id number] ?Date of Birth: 04-14-1970 ?Referring Provider (OT): Lendon Colonel, PA ? ? ?Encounter Date: 06/23/2021 ? ? OT End of Session - 06/23/21 1339   ? ? Visit Number 8   ? Number of Visits 20   ? Date for OT Re-Evaluation 07/09/21   ? Authorization Type BCBS 1/1-12/31/2023   25.00 due toward bill   Deduct [id number] met   OOP [id number]   No Copay no Co Ins   No Auth Required   Blue E Portal   ? OT Start Time 1100   ? OT Stop Time 1200   ? OT Time Calculation (min) 60 min   ? ?  ?  ? ?  ? ? ?Past Medical History:  ?Diagnosis Date  ? Anxiety   ? Arthritis   ? ra  ? Colitis   ? Depression   ? GERD (gastroesophageal reflux disease)   ? Hypercholesterolemia   ? DR. WHITE  ? Migraines   ? PIH (pregnancy induced hypertension) yrs ago, none since  ? PONV (postoperative nausea and vomiting)   ? Wears glasses   ? ? ?Past Surgical History:  ?Procedure Laterality Date  ? ANAL FISSURE REPAIR N/A 04/09/2015  ? Procedure: ANAL CHEMICAL DENERVATION(BOTOX);  Surgeon: ARalene Ok MD;  [facility]  Service: General;   Laterality: N/A;  ? ANAL FISSURE REPAIR  07/06/2016  ? CATARACT EXTRACTION Left   ? CESAREAN SECTION  2007  ? COLONOSCOPY    ? INTRAUTERINE DEVICE INSERTION  12/15/2005  ? MOUTH SURGERY    ? RECTAL EXAM UNDER ANESTHESIA N/A 02/17/2015  ? Procedure: RECTAL EXAM UNDER ANESTHESIA;  Surgeon: ARalene Ok MD;  [facility];  Service: General;  Laterality: N/A;  ? RETINAL DETACHMENT SURGERY  2019  ? X 5   ? SPHINCTEROTOMY N/A 02/17/2015  ? Procedure: LATERAL INTERNAL SPHINCTEROTOMY;  Surgeon: ARalene Ok MD;  Location: [facility];  Service: General;  Laterality: N/A;  ? ? ?There were no vitals filed for this visit. ? ? Subjective Assessment - 06/23/21 1339   ? ? Currently in Pain? No/denies   ? Pain Score 0-No pain   ? ?  ?  ? ?  ? ? ? ? ? ?Group Session: ? ?S: Patient reported feeling highly engaged in the session focused on communication and forgiveness. Patient shared personal experiences related to past conflicts and how forgiveness played a role in resolving them. Patient also reported feeling more aware of their own communication patterns and how they might be impacting their relationships. ? ?O: During the session, patient actively participated in reflective writing activities and shared their thoughts and feelings with  the group. Patient also engaged in group discussion and shared personal anecdotes related to the topic. ? ?A: Patient demonstrated strong insight into their communication patterns and expressed a desire to continue working on improving their ability to communicate effectively. Therapist will continue to encourage patient to practice reflective writing and to use strategies learned in the session to improve their communication skills. ? ?P: Continue to attend PHP OT group sessions 5x week for 4 weeks to promote daily structure, social engagement, and opportunities to develop and utilize adaptive strategies to maximize functional performance in preparation for safe transition and integration  back into school, work, and the community. Plan to address topic of coping skills and crisis mgmt in next OT group session. ? ? ? ? ? ? ? ? ? ? ? ? ? ? ? ? ? ? OT Education - 06/23/21 1339   ? ? Education Community education officer and Forgiveness for Improved Relationships / part 2   ? Person(s) Educated Patient   ? Methods Explanation;Demonstration   ? Comprehension Verbalized understanding   ? ?  ?  ? ?  ? ? ? OT Short Term Goals - 06/09/21 1247   ? ?  ? OT SHORT TERM GOAL #1  ? Title Pt will apply psychosocial skills and coping mechanisms to daily activities in order to function independently and reintegrate into community.   ? Time 4   ? Period Weeks   ? Status New   ? Target Date 07/09/21   ?  ? OT SHORT TERM GOAL #2  ? Title Patient will actively engage in OT group sessions throughout duration of PHP programming, in order to promote daily structure, social engagement, and opportunities to develop and utilize adaptive strategies to maximize functional performance in preparation for safe transition and integration back into school, work, and community living.   ? Time 4   ? Period Weeks   ? Status New   ?  ? OT SHORT TERM GOAL #3  ? Title Patient will be educated and independent on strategies to improve communication skills and making requests of others.   ? Time 4   ? Period Weeks   ? Status New   ? ?  ?  ? ?  ? ? ? ? ? ? ? ? ? ? ? Plan - 06/23/21 1340   ? ? Psychosocial Skills Coping Strategies;Routines and Behaviors;Interpersonal Interaction;Habits   ? ?  ?  ? ?  ? ? ?Patient will benefit from skilled therapeutic intervention in order to improve the following deficits and impairments:   ?  ?  ?Psychosocial Skills: Coping Strategies, Routines and Behaviors, Interpersonal Interaction, Habits ? ? ?Visit Diagnosis: ?Difficulty coping ? ? ? ?Problem List ?Patient Active Problem List  ? Diagnosis Date Noted  ? Generalized anxiety disorder 06/06/2021  ? MDD (major depressive disorder), recurrent severe,  without psychosis ([place]) 05/25/2021  ? IUD (intrauterine device) in place 01/21/2016  ? Steroid long-term use 08/20/2012  ? Ulcerative colitis ([place]) 08/20/2012  ? Hypercholesterolemia 01/13/2011  ? ? ?Brantley Stage, OT ?06/23/2021, 1:40 PM ? ?Cornell Barman, OT ? ? ?[facility] ?[facility] ?[place], [place], [address] ?Phone: [phone]   Fax:  [phone] ? ?Name: Shaley Leavens Deroche ?MRN: [id number] ?Date of Birth: Dec 08, 1970 ? ?

## 2021-06-24 ENCOUNTER — Other Ambulatory Visit (HOSPITAL_COMMUNITY): Payer: BC Managed Care – PPO

## 2021-06-24 ENCOUNTER — Other Ambulatory Visit (HOSPITAL_COMMUNITY): Payer: BC Managed Care – PPO | Admitting: Licensed Clinical Social Worker

## 2021-06-24 DIAGNOSIS — F332 Major depressive disorder, recurrent severe without psychotic features: Secondary | ICD-10-CM | POA: Diagnosis not present

## 2021-06-24 DIAGNOSIS — F411 Generalized anxiety disorder: Secondary | ICD-10-CM

## 2021-06-27 ENCOUNTER — Encounter (HOSPITAL_COMMUNITY): Payer: Self-pay

## 2021-06-27 ENCOUNTER — Other Ambulatory Visit (HOSPITAL_COMMUNITY): Payer: BC Managed Care – PPO | Admitting: Licensed Clinical Social Worker

## 2021-06-27 ENCOUNTER — Other Ambulatory Visit (HOSPITAL_COMMUNITY): Payer: BC Managed Care – PPO

## 2021-06-27 DIAGNOSIS — F332 Major depressive disorder, recurrent severe without psychotic features: Secondary | ICD-10-CM

## 2021-06-27 DIAGNOSIS — F411 Generalized anxiety disorder: Secondary | ICD-10-CM

## 2021-06-27 DIAGNOSIS — R4589 Other symptoms and signs involving emotional state: Secondary | ICD-10-CM

## 2021-06-27 NOTE — Therapy (Signed)
[facility] ?[facility] ?[place], [place], [address] ?Phone: [phone]   Fax:  [phone] ? ?Occupational Therapy Treatment ? ?Virtual Visit via Video Note ? ?I connected with Rebekah Silva on 06/27/21 at  8:00 AM EDT by a video enabled telemedicine application and verified that I am speaking with the correct person using two identifiers. ? ?Location: ?Patient: home ?Provider: office ?  ?I discussed the limitations of evaluation and management by telemedicine and the availability of in person appointments. The patient expressed understanding and agreed to proceed. ? ?  ?The patient was advised to call back or seek an in-person evaluation if the symptoms worsen or if the condition fails to improve as anticipated. ? ?I provided 60 minutes of non-face-to-face time during this encounter. ? ? ?Patient Details  ?Name: Rebekah Silva ?MRN: [id number] ?Date of Birth: Jan 10, 1971 ?Referring Provider (OT): Lendon Colonel, PA ? ? ?Encounter Date: 06/27/2021 ? ? OT End of Session - 06/27/21 1209   ? ? Visit Number 9   ? Number of Visits 20   ? Date for OT Re-Evaluation 07/09/21   ? Authorization Type BCBS 1/1-12/31/2023   25.00 due toward bill   Deduct [id number] met   OOP [id number]   No Copay no Co Ins   No Auth Required   Blue E Portal   ? OT Start Time 1100   ? OT Stop Time 1200   ? OT Time Calculation (min) 60 min   ? ?  ?  ? ?  ? ? ?Past Medical History:  ?Diagnosis Date  ? Anxiety   ? Arthritis   ? ra  ? Colitis   ? Depression   ? GERD (gastroesophageal reflux disease)   ? Hypercholesterolemia   ? DR. WHITE  ? Migraines   ? PIH (pregnancy induced hypertension) yrs ago, none since  ? PONV (postoperative nausea and vomiting)   ? Wears glasses   ? ? ?Past Surgical History:  ?Procedure Laterality Date  ? ANAL FISSURE REPAIR N/A 04/09/2015  ? Procedure: ANAL CHEMICAL DENERVATION(BOTOX);  Surgeon: ARalene Ok MD;  [facility]  Service: General;   Laterality: N/A;  ? ANAL FISSURE REPAIR  07/06/2016  ? CATARACT EXTRACTION Left   ? CESAREAN SECTION  2007  ? COLONOSCOPY    ? INTRAUTERINE DEVICE INSERTION  12/15/2005  ? MOUTH SURGERY    ? RECTAL EXAM UNDER ANESTHESIA N/A 02/17/2015  ? Procedure: RECTAL EXAM UNDER ANESTHESIA;  Surgeon: ARalene Ok MD;  [facility];  Service: General;  Laterality: N/A;  ? RETINAL DETACHMENT SURGERY  2019  ? X 5   ? SPHINCTEROTOMY N/A 02/17/2015  ? Procedure: LATERAL INTERNAL SPHINCTEROTOMY;  Surgeon: ARalene Ok MD;  Location: [facility];  Service: General;  Laterality: N/A;  ? ? ?There were no vitals filed for this visit. ? ? Subjective Assessment - 06/27/21 1207   ? ? Currently in Pain? No/denies   ? Pain Score 0-No pain   ? ?  ?  ? ?  ? ? ? ? ?Group Session: ? ?S: The patient attended today's telehealth group session focused on crisis management and coping strategies. She reported feeling very engaged and participated actively in the group discussion. She shared about her current coping mechanisms and expressed interest in learning new strategies. ? ?O: During the group session, the pt. shared her experiences with coloring via meditation, deep breathing and progressive muscle relaxation techniques. She appeared attentive and engaged, and offered  suggestions and feedback to other group members. ? ?A: Pt. demonstrated active participation and engagement during the group session. She displayed a willingness to learn and utilize new coping strategies. ? ?P: Continue to attend PHP OT group sessions 5x week for 4 weeks to promote daily structure, social engagement, and opportunities to develop and utilize adaptive strategies to maximize functional performance in preparation for safe transition and integration back into school, work, and the community. Plan to address topic of coping / crisis mgmt pt/2 in next OT group session. ? ? ? ? ? ? ? ? ? ? ? ? ? ? ? ? ? ? ? OT Education - 06/27/21 1208   ? ? Education Details ?Coping  with Crisis?Strategies for Managing Our Daily Challenges / part one of two   ? Person(s) Educated Patient   ? Methods Explanation;Demonstration   ? Comprehension Verbalized understanding   ? ?  ?  ? ?  ? ? ? OT Short Term Goals - 06/09/21 1247   ? ?  ? OT SHORT TERM GOAL #1  ? Title Pt will apply psychosocial skills and coping mechanisms to daily activities in order to function independently and reintegrate into community.   ? Time 4   ? Period Weeks   ? Status New   ? Target Date 07/09/21   ?  ? OT SHORT TERM GOAL #2  ? Title Patient will actively engage in OT group sessions throughout duration of PHP programming, in order to promote daily structure, social engagement, and opportunities to develop and utilize adaptive strategies to maximize functional performance in preparation for safe transition and integration back into school, work, and community living.   ? Time 4   ? Period Weeks   ? Status New   ?  ? OT SHORT TERM GOAL #3  ? Title Patient will be educated and independent on strategies to improve communication skills and making requests of others.   ? Time 4   ? Period Weeks   ? Status New   ? ?  ?  ? ?  ? ? ? ? ? ? ? ? ? ? ? Plan - 06/27/21 1209   ? ? Psychosocial Skills Coping Strategies;Routines and Behaviors;Interpersonal Interaction;Habits   ? ?  ?  ? ?  ? ? ?Patient will benefit from skilled therapeutic intervention in order to improve the following deficits and impairments:   ?  ?  ?Psychosocial Skills: Coping Strategies, Routines and Behaviors, Interpersonal Interaction, Habits ? ? ?Visit Diagnosis: ?Difficulty coping ? ? ? ?Problem List ?Patient Active Problem List  ? Diagnosis Date Noted  ? Generalized anxiety disorder 06/06/2021  ? MDD (major depressive disorder), recurrent severe, without psychosis ([place]) 05/25/2021  ? IUD (intrauterine device) in place 01/21/2016  ? Steroid long-term use 08/20/2012  ? Ulcerative colitis ([place]) 08/20/2012  ? Hypercholesterolemia 01/13/2011  ? ? ?Brantley Stage,  OT ?06/27/2021, 12:10 PM ?Cornell Barman, OT ? ? ?[facility] ?[facility] ?[place], [place], [address] ?Phone: [phone]   Fax:  [phone] ? ?Name: Rebekah Silva ?MRN: [id number] ?Date of Birth: January 24, 1971 ? ?

## 2021-06-28 ENCOUNTER — Ambulatory Visit (HOSPITAL_COMMUNITY): Payer: BC Managed Care – PPO

## 2021-06-28 ENCOUNTER — Other Ambulatory Visit (HOSPITAL_COMMUNITY): Payer: BC Managed Care – PPO

## 2021-06-28 NOTE — Psych (Addendum)
Virtual Visit via Video Note  I connected with Rebekah Silva on 06/27/21 at  9:00 AM EDT by a video enabled telemedicine application and verified that I am speaking with the correct person using two identifiers.  Location: Patient: home Provider: clinical home office   I discussed the limitations of evaluation and management by telemedicine and the availability of in person appointments. The patient expressed understanding and agreed to proceed.  I discussed the assessment and treatment plan with the patient. The patient was provided an opportunity to ask questions and all were answered. The patient agreed with the plan and demonstrated an understanding of the instructions.   The patient was advised to call back or seek an in-person evaluation if the symptoms worsen or if the condition fails to improve as anticipated.  Pt was provided 240 minutes of non-face-to-face time during this encounter.   Rebekah Glass, LCSW   [facility] PHP THERAPIST PROGRESS NOTE  Rebekah Silva [id number]  Session Time: 9:00 -10:00  Participation Level: Active  Behavioral Response: CasualAlertAnxious and Depressed  Type of Therapy: Group Therapy  Treatment Goals addressed: Coping  Progress Towards Goals: Progressing  Interventions: CBT, DBT, Solution Focused, Strength-based, Supportive, and Reframing  Summary: Clinician led check-in regarding current stressors and situation. Clinician utilized active listening and empathetic response and validated patient emotions. Clinician facilitated processing group on pertinent issues.  Therapist Response: Rebekah Silva is a 51 y.o. female who presents with depression and anxiety symptoms. Patient arrived within time allowed and reports that she is feeling "pretty good." Patient rates her mood at a 7.5 on a scale of 1-10 with 10 being great. Pt reports spending the weekend with in-laws for holiday and it went well however was "more exhausting" than  normal due to her mental state. Pt states she drove near her school today and had a panic attack. Pt states she did not handle it well.  Patient able to process. Patient engaged in discussion.      Session Time: 10:00- 11:00   Participation Level: Active   Behavioral Response: CasualAlertDepressed   Type of Therapy: Group Therapy   Treatment Goals addressed: Coping  Progress Towards Goals: Progressing   Interventions: CBT, DBT, Supportive, Reframing, and Strengths Based   Summary: Cln introduced topic of stress management and the model of the "4 A's of stress management:" avoid, alter, accept, and adapt. Group members worked through Advice worker and discussed barriers to utilizing the 4 A's for stressors.   Therapist Response: Pt engaged in discussion and reports understanding of how to utilize the 4 A's.      Session Time: 11:00 -12:00   Participation Level: Active   Behavioral Response: CasualAlertDepressed   Type of Therapy: Group Therapy, Occupational Therapy   Treatment Goals addressed: Coping  Progress Towards Goals: Progressing   Interventions: Supportive, Education   Summary:  Occupational Therapist, Rebekah Silva led group   Therapist Response: See OT note.       Session Time: 12:00 -1:00   Participation Level: Active   Behavioral Response: CasualAlertDepressed   Type of Therapy: Group therapy   Treatment Goals addressed: Coping  Progress Towards Goals: Progressing   Interventions: CBT; Solution focused; Supportive; Reframing   Summary: 12:00 - 12:50: Cln continued topic of boundaries and led a "boundary workshop" in which group members brought current boundary issues and group worked together to apply boundary concepts to help address the concern. Cln helped shape conversation to maintain fidelity.  12:50 -1:00 Clinician led check-out. Clinician  assessed for immediate needs, medication compliance and efficacy, and safety concerns   Therapist Response:  12:00 - 12:50: Pt engaged in discussion and shared a current boundary issue and reports gaining insight.  12:50 - 1:00: At check-out, patient reports no immediate concerns. Patient demonstrates some progress as evidenced by utilizing coping skills. Patient denies SI/HI/self-harm at the end of group.    Suicidal/Homicidal: Nowithout intent/plan  Plan: Pt will continue in PHP and medication management while continuing to work on decreasing depression symptoms, SI, and increasing the ability to self manage symptoms.  Collaboration of Care: None  Patient/Guardian was advised Release of Information must be obtained prior to any record release in order to collaborate their care with an outside provider. Patient/Guardian was advised if they have not already done so to contact the registration department to sign all necessary forms in order for [place] to release information regarding their care.   Consent: Patient/Guardian gives verbal consent for treatment and assignment of benefits for services provided during this visit. Patient/Guardian expressed understanding and agreed to proceed.   Diagnosis: MDD (major depressive disorder), recurrent severe, without psychosis ([place]) [F33.2]    1. MDD (major depressive disorder), recurrent severe, without psychosis ([place])   2. Generalized anxiety disorder       Rebekah Glass, LCSW

## 2021-06-29 ENCOUNTER — Other Ambulatory Visit (HOSPITAL_COMMUNITY): Payer: BC Managed Care – PPO | Admitting: Licensed Clinical Social Worker

## 2021-06-29 ENCOUNTER — Encounter (HOSPITAL_COMMUNITY): Payer: Self-pay

## 2021-06-29 ENCOUNTER — Other Ambulatory Visit (HOSPITAL_COMMUNITY): Payer: BC Managed Care – PPO

## 2021-06-29 DIAGNOSIS — F332 Major depressive disorder, recurrent severe without psychotic features: Secondary | ICD-10-CM | POA: Diagnosis not present

## 2021-06-29 DIAGNOSIS — R4589 Other symptoms and signs involving emotional state: Secondary | ICD-10-CM

## 2021-06-29 DIAGNOSIS — F411 Generalized anxiety disorder: Secondary | ICD-10-CM

## 2021-06-29 NOTE — Therapy (Signed)
[facility] ?[facility] ?[place], [place], [address] ?Phone: [phone]   Fax:  [phone] ? ?Occupational Therapy Treatment ? ?Virtual Visit via Video Note ? ?I connected with Rebekah Silva on 06/29/21 at  8:00 AM EDT by a video enabled telemedicine application and verified that I am speaking with the correct person using two identifiers. ? ?Location: ?Patient: home ?Provider: office ?  ?I discussed the limitations of evaluation and management by telemedicine and the availability of in person appointments. The patient expressed understanding and agreed to proceed. ? ?  ?The patient was advised to call back or seek an in-person evaluation if the symptoms worsen or if the condition fails to improve as anticipated. ? ?I provided 50 minutes of non-face-to-face time during this encounter. ? ? ?Patient Details  ?Name: Rebekah Silva ?MRN: [id number] ?Date of Birth: 1970/06/25 ?Referring Provider (OT): Lendon Colonel, PA ? ? ?Encounter Date: 06/29/2021 ? ? OT End of Session - 06/29/21 1423   ? ? Visit Number 10   ? Number of Visits 20   ? Date for OT Re-Evaluation 07/09/21   ? Authorization Type BCBS 1/1-12/31/2023   25.00 due toward bill   Deduct [id number] met   OOP [id number]   No Copay no Co Ins   No Auth Required   Blue E Portal   ? OT Start Time 1200   ? OT Stop Time 1250   ? OT Time Calculation (min) 50 min   ? ?  ?  ? ?  ? ? ?Past Medical History:  ?Diagnosis Date  ? Anxiety   ? Arthritis   ? ra  ? Colitis   ? Depression   ? GERD (gastroesophageal reflux disease)   ? Hypercholesterolemia   ? DR. WHITE  ? Migraines   ? PIH (pregnancy induced hypertension) yrs ago, none since  ? PONV (postoperative nausea and vomiting)   ? Wears glasses   ? ? ?Past Surgical History:  ?Procedure Laterality Date  ? ANAL FISSURE REPAIR N/A 04/09/2015  ? Procedure: ANAL CHEMICAL DENERVATION(BOTOX);  Surgeon: ARalene Ok MD;  [facility]  Service: General;   Laterality: N/A;  ? ANAL FISSURE REPAIR  07/06/2016  ? CATARACT EXTRACTION Left   ? CESAREAN SECTION  2007  ? COLONOSCOPY    ? INTRAUTERINE DEVICE INSERTION  12/15/2005  ? MOUTH SURGERY    ? RECTAL EXAM UNDER ANESTHESIA N/A 02/17/2015  ? Procedure: RECTAL EXAM UNDER ANESTHESIA;  Surgeon: ARalene Ok MD;  [facility];  Service: General;  Laterality: N/A;  ? RETINAL DETACHMENT SURGERY  2019  ? X 5   ? SPHINCTEROTOMY N/A 02/17/2015  ? Procedure: LATERAL INTERNAL SPHINCTEROTOMY;  Surgeon: ARalene Ok MD;  Location: [facility];  Service: General;  Laterality: N/A;  ? ? ?There were no vitals filed for this visit. ? ? Subjective Assessment - 06/29/21 1422   ? ? Currently in Pain? No/denies   ? Pain Score 0-No pain   ? ?  ?  ? ?  ? ? ? ? ? ? ? ?Group Session: ? ?S: During today's OT session, the patient reported feeling frustrated with her daily tasks, including dressing and grooming and stress related to her occupation. She expressed difficulty with coping skills related to work. Expressed her wish to continue OP rehab group Tx ? ?O: Pt was more quiet today than typical, she did however share and was able to offer examples in her past experiences where using  adaptation and modifications have helped her and stated she will begin to employ more of these strategies in the future.  ? 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 She continues to seek alternative methods to address these barriers and looks forward to cont'd OT group sessions to furhter explore skills to improve her overall occupational performance.  ? ?P: Continue to attend PHP OT group sessions 5x week for 4 weeks to promote daily structure, social engagement, and opportunities to develop and utilize adaptive strategies to maximize functional performance in preparation for safe transition and integration back into school, work, and the community. Plan to address topic of  adaptation and modification of daily tasks to improve performance pt 2 in next OT group session. ? ? ? ? ? ? ? ? ? ? ? ? ? ? ? ? OT Education - 06/29/21 1422   ? ? Education Details "Mind, Body, and Soul? Navigating Life's Challenges with Adaptive Strategies and Positive Change, Pt 1/2   ? Person(s) Educated Patient   ? Methods Explanation;Demonstration   ? Comprehension Verbalized understanding   ? ?  ?  ? ?  ? ? ? OT Short Term Goals - 06/09/21 1247   ? ?  ? OT SHORT TERM GOAL #1  ? Title Pt will apply psychosocial skills and coping mechanisms to daily activities in order to function independently and reintegrate into community.   ? Time 4   ? Period Weeks   ? Status New   ? Target Date 07/09/21   ?  ? OT SHORT TERM GOAL #2  ? Title Patient will actively engage in OT group sessions throughout duration of PHP programming, in order to promote daily structure, social engagement, and opportunities to develop and utilize adaptive strategies to maximize functional performance in preparation for safe transition and integration back into school, work, and community living.   ? Time 4   ? Period Weeks   ? Status New   ?  ? OT SHORT TERM GOAL #3  ? Title Patient will be educated and independent on strategies to improve communication skills and making requests of others.   ? Time 4   ? Period Weeks   ? Status New   ? ?  ?  ? ?  ? ? ? ? ? ? ? ? ? ? ? Plan - 06/29/21 1423   ? ? Psychosocial Skills Coping Strategies;Routines and Behaviors;Interpersonal Interaction;Habits   ? ?  ?  ? ?  ? ? ?Patient will benefit from skilled therapeutic intervention in order to improve the following deficits and impairments:   ?  ?  ?Psychosocial Skills: Coping Strategies, Routines and Behaviors, Interpersonal Interaction, Habits ? ? ?Visit Diagnosis: ?Difficulty coping ? ? ? ?Problem List ?Patient Active Problem List  ? Diagnosis Date Noted  ? Generalized anxiety disorder 06/06/2021  ? MDD (major depressive disorder), recurrent severe, without  psychosis ([place]) 05/25/2021  ? IUD (intrauterine device) in place 01/21/2016  ? Steroid long-term use 08/20/2012  ? Ulcerative colitis ([place]) 08/20/2012  ? Hypercholesterolemia 01/13/2011  ? ? ?Brantley Stage, OT ?06/29/2021, 2:24 PM ?Cornell Barman, OT ? ? ?[facility] ?[facility] ?[place], [place], [address] ?Phone: [phone]   Fax:  [phone] ? ?Name: Rebekah Silva ?MRN: [id number] ?Date of Birth: 08/03/1970 ? ?

## 2021-06-30 ENCOUNTER — Other Ambulatory Visit (HOSPITAL_COMMUNITY): Payer: BC Managed Care – PPO

## 2021-06-30 ENCOUNTER — Telehealth (HOSPITAL_COMMUNITY): Payer: Self-pay | Admitting: Psychiatry

## 2021-06-30 ENCOUNTER — Other Ambulatory Visit (HOSPITAL_COMMUNITY): Payer: BC Managed Care – PPO | Admitting: Licensed Clinical Social Worker

## 2021-06-30 DIAGNOSIS — F411 Generalized anxiety disorder: Secondary | ICD-10-CM

## 2021-06-30 DIAGNOSIS — F332 Major depressive disorder, recurrent severe without psychotic features: Secondary | ICD-10-CM

## 2021-06-30 IMAGING — MG MM DIGITAL DIAGNOSTIC UNILAT*L* W/ TOMO W/ CAD
4 series · 4 of 8 positions shown · non-contrast
Comparison: Previous exam(s).

ACR Breast Density Category a: The breast tissue is almost entirely
fatty.

CLINICAL DATA: The patient was called back for left breast focal
asymmetry.

EXAM:
DIGITAL DIAGNOSTIC LEFT MAMMOGRAM WITH TOMO
ULTRASOUND LEFT BREAST

[L CC synth-2D (1 of 2)]
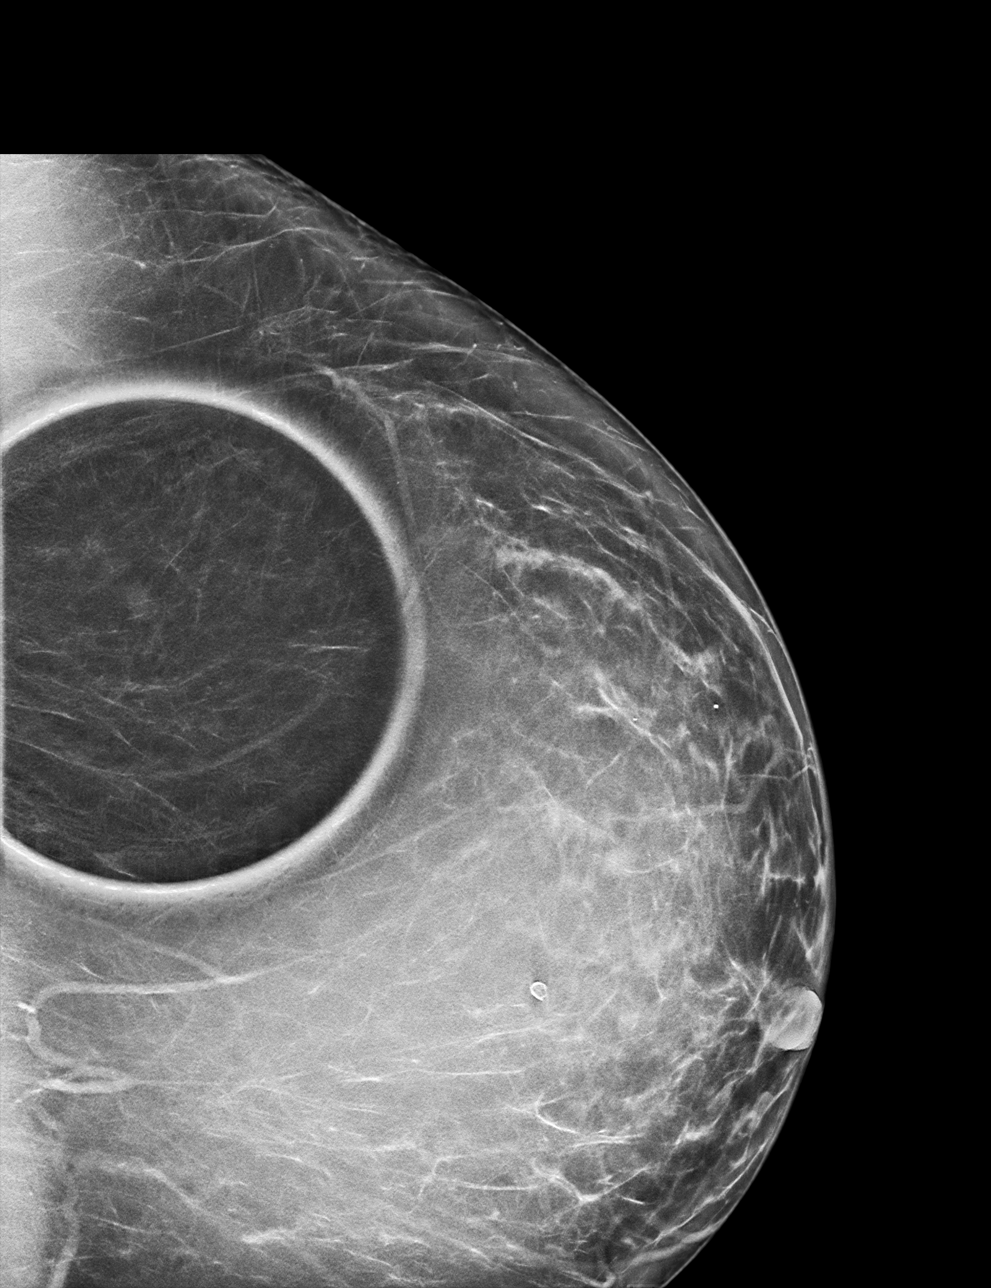

[L MLO synth-2D]
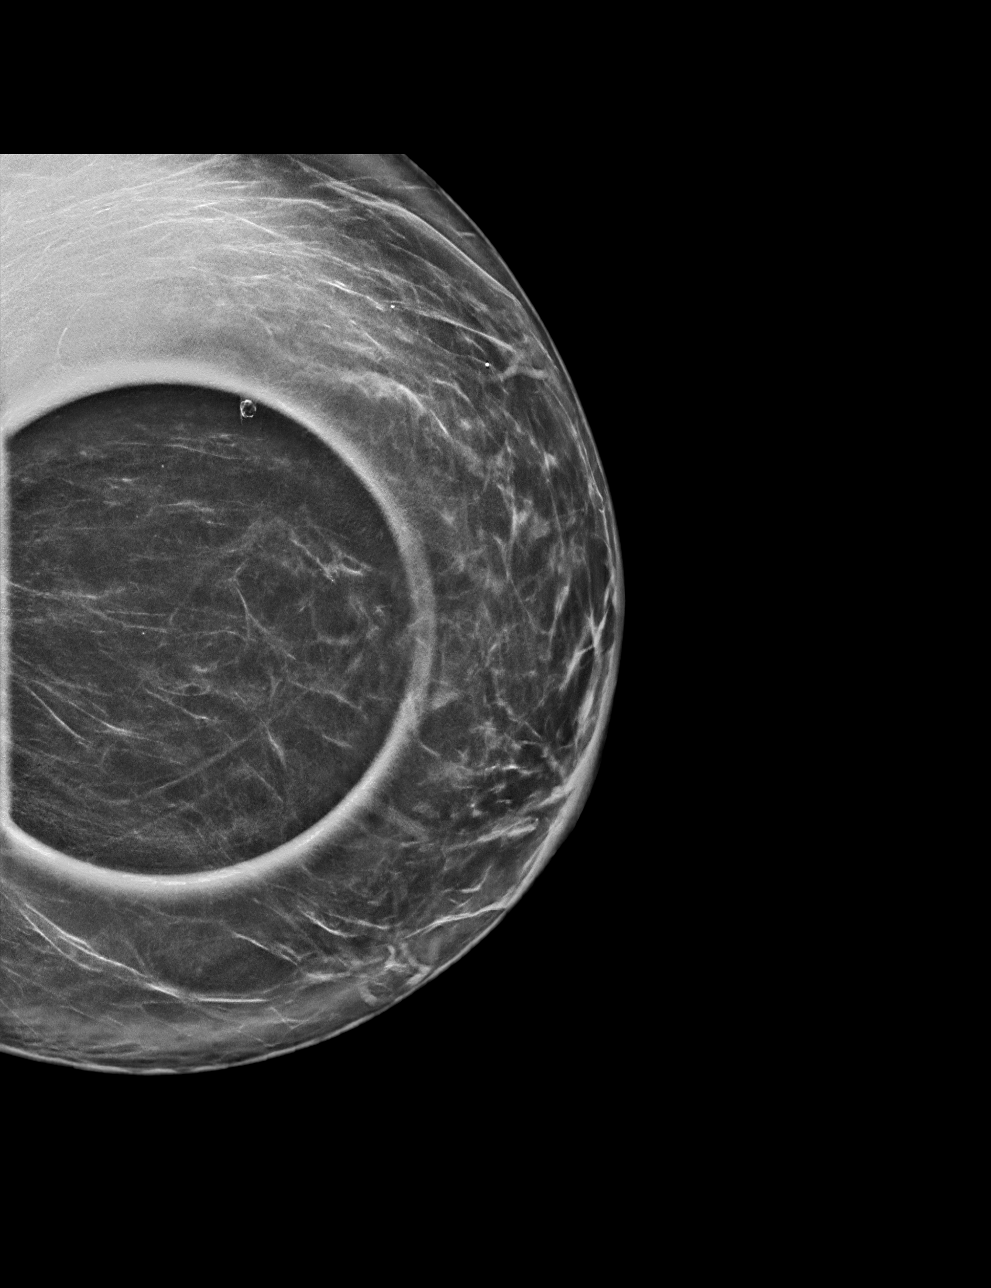

[L CC synth-2D (2 of 2)]
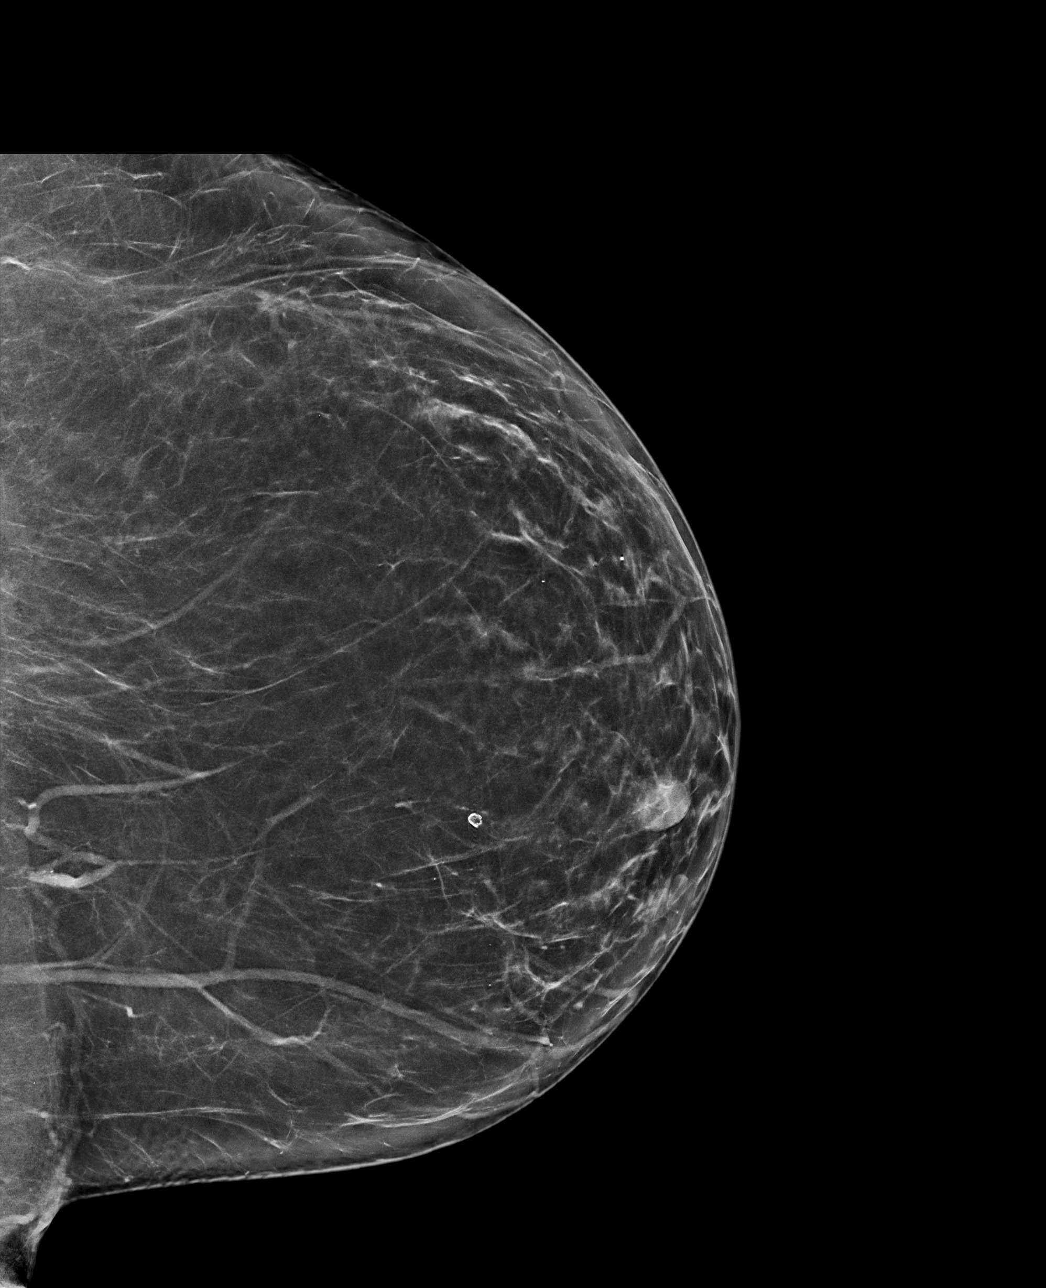

[L MLO tomo · tomo slice 41/80.0]
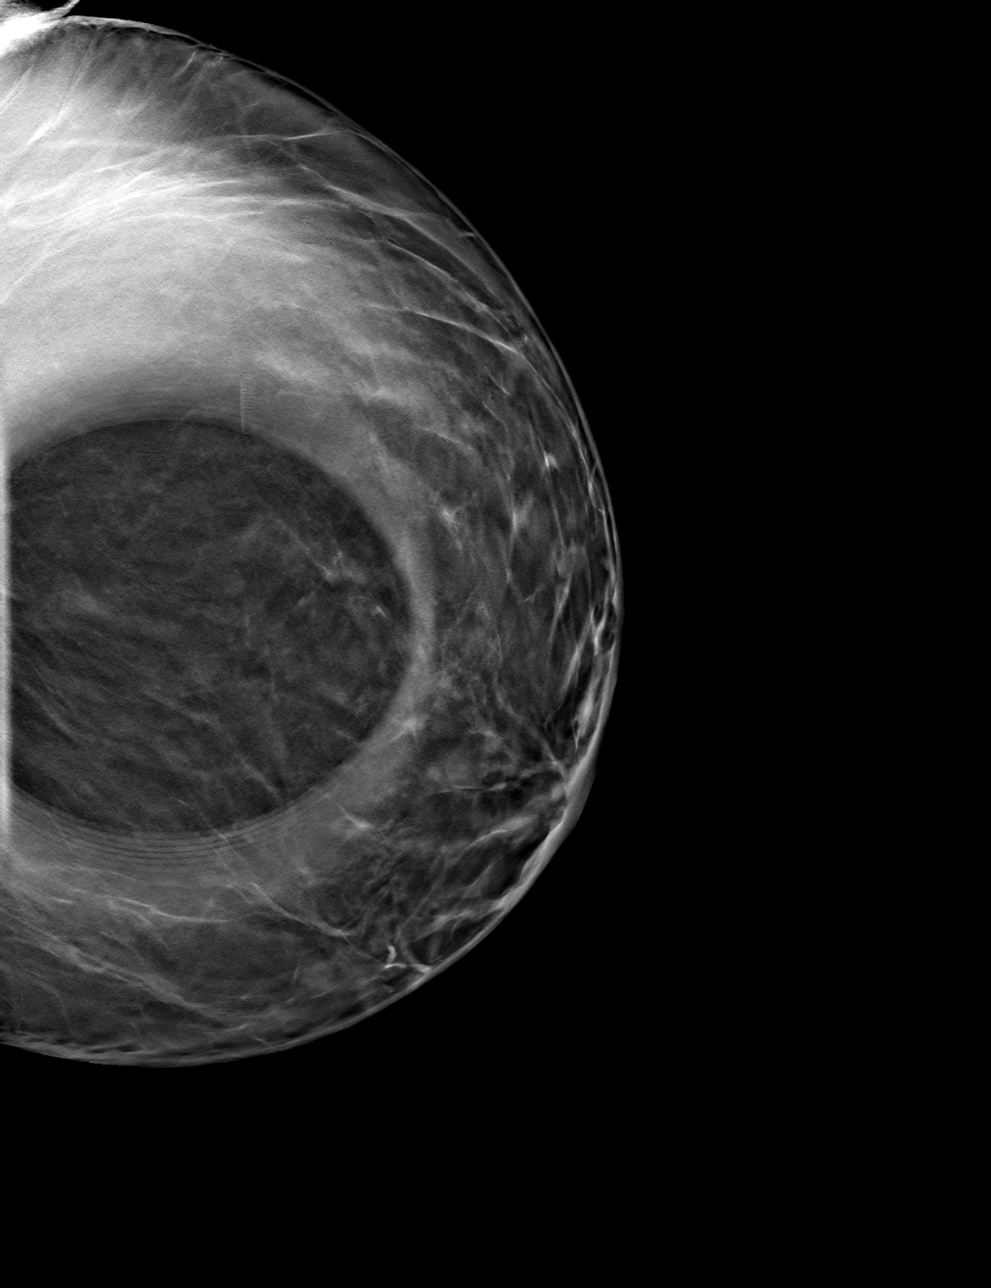

[4 of 8 positions shown; findings below may reference images not displayed]

FINDINGS: The left breast focal asymmetry has essentially resolved in the
interval, consistent with a benign process.

On physical exam, no suspicious lumps are identified.

Targeted ultrasound is performed, showing no sonographic
abnormalities in the region of the previously identified focal
asymmetry.
IMPRESSION: No mammographic or sonographic evidence of malignancy.

RECOMMENDATION:
Annual screening mammography.

Additionally, the patient's mother and 2 of her maternal aunts had
breast cancer. According to NCCN guidelines, the patient should be
assessed by a genetic counselor. If the patient is found to be
genetically positive for a breast cancer related genetic mutation or
if the patient is found have a lifetime risk of breast cancer of
greater than 20%, recommend annual breast MRI and annual
mammography.

I have discussed the findings and recommendations with the patient.
If applicable, a reminder letter will be sent to the patient
regarding the next appointment.

BI-RADS CATEGORY  2: Benign.

## 2021-06-30 IMAGING — US US BREAST*L* LIMITED INC AXILLA
1 series · 7 of 7 positions shown · non-contrast
Comparison: Previous exam(s).

ACR Breast Density Category a: The breast tissue is almost entirely
fatty.

CLINICAL DATA: The patient was called back for left breast focal
asymmetry.

EXAM:
DIGITAL DIAGNOSTIC LEFT MAMMOGRAM WITH TOMO
ULTRASOUND LEFT BREAST

[Series 1: us breast*left* limited inc axilla · 0.08mm/px · 7 of 7 slices shown]
[im 1/7]
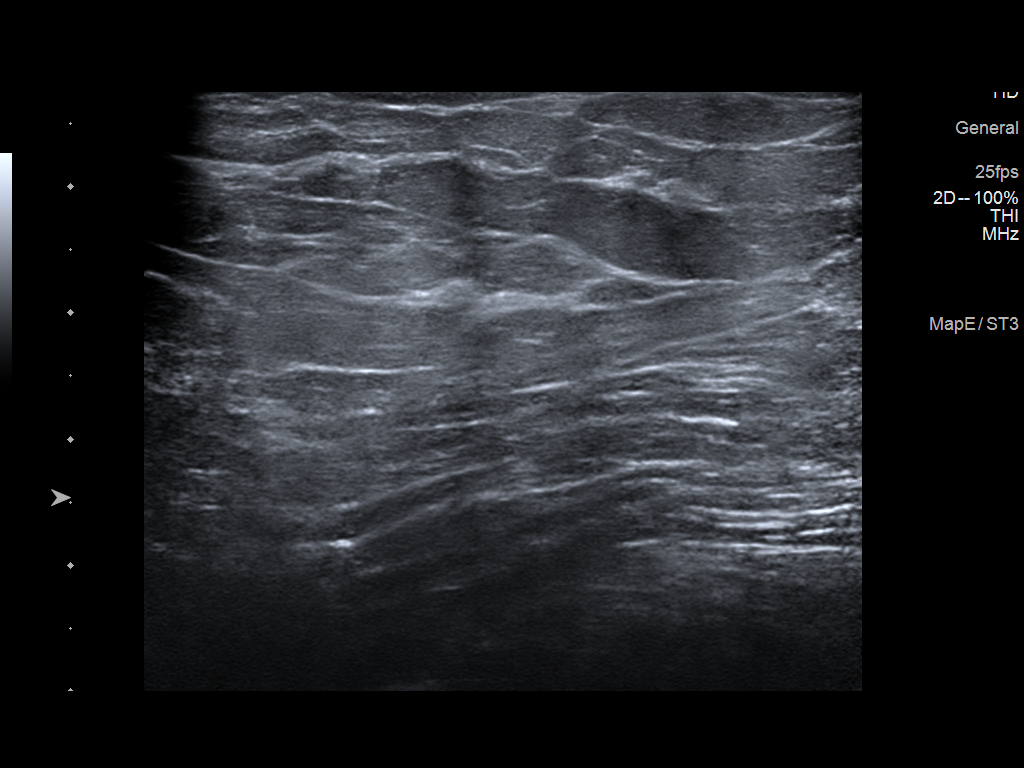
[im 2/7]
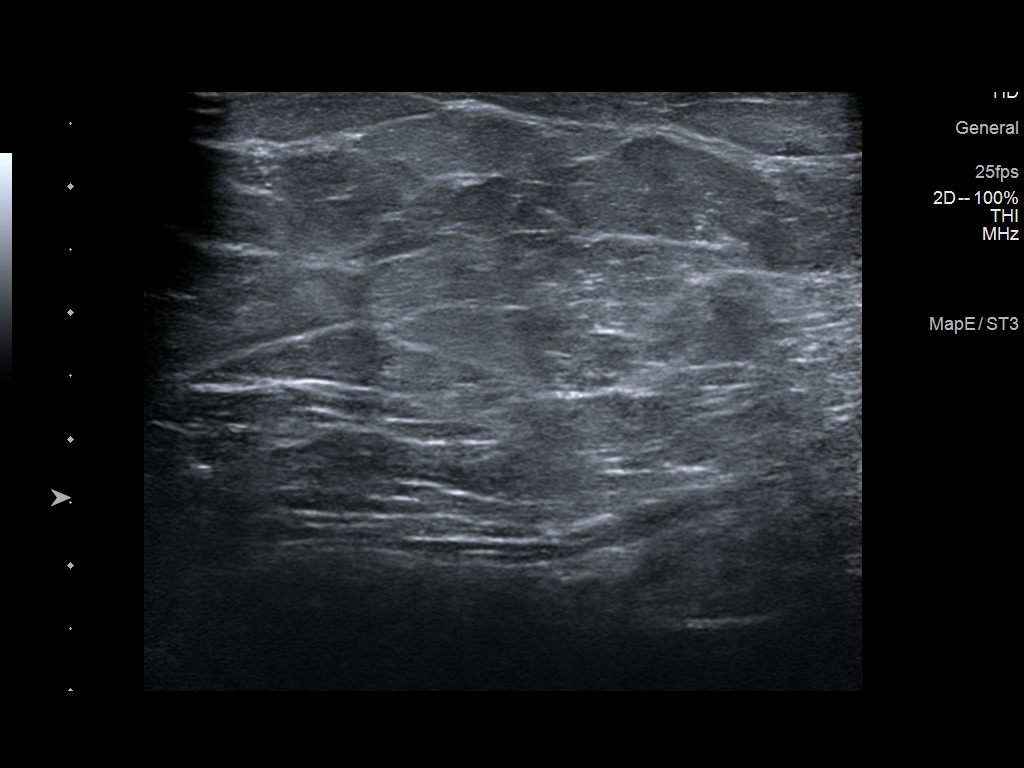
[im 3/7]
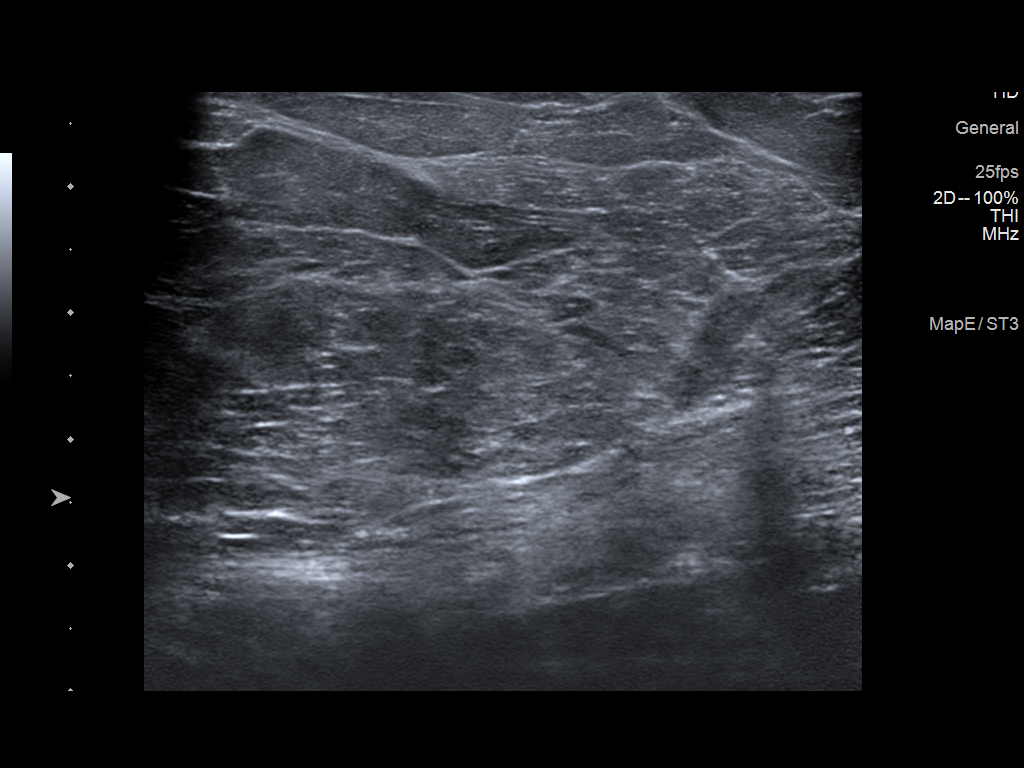
[im 4/7]
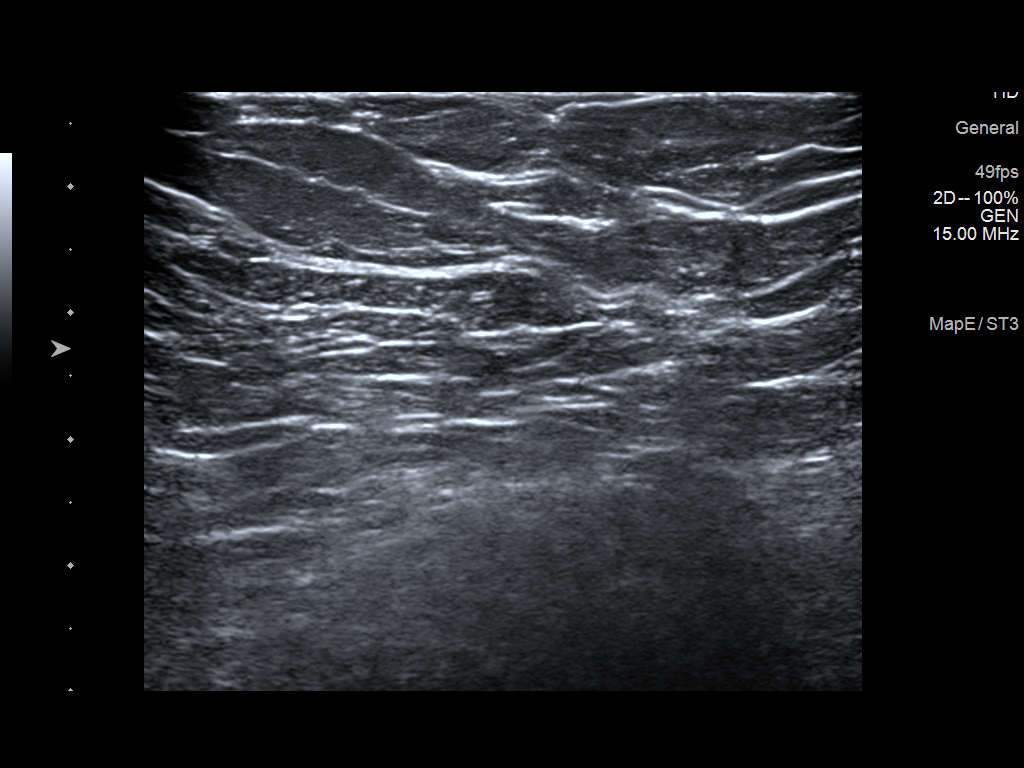
[im 5/7]
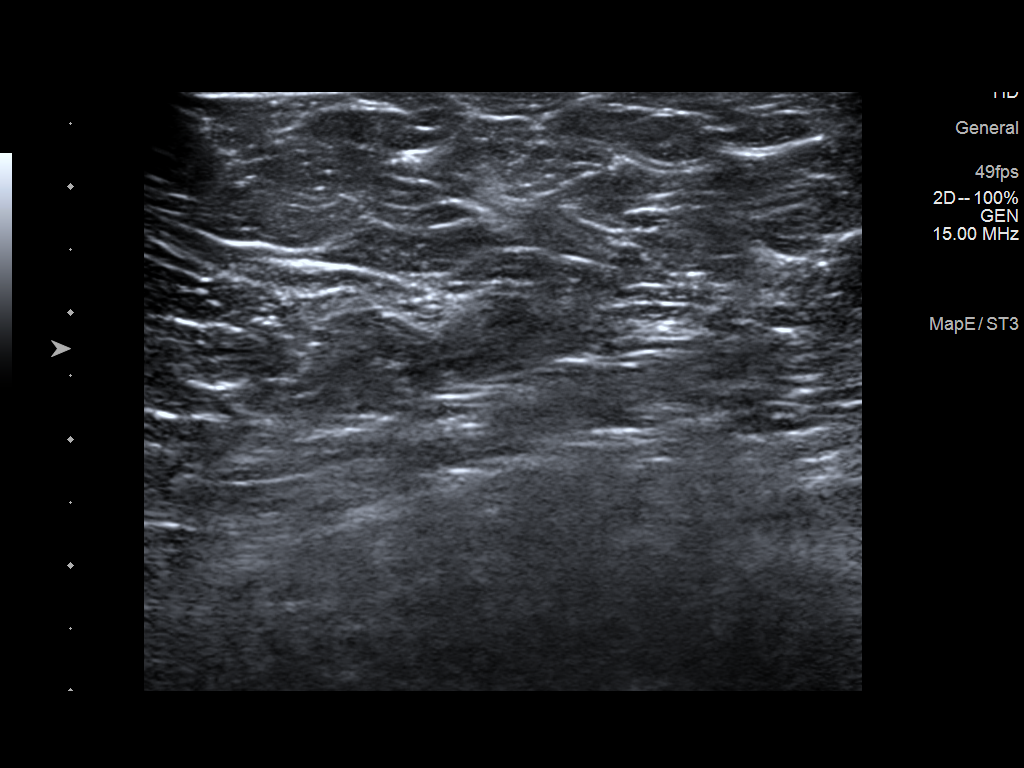
[im 6/7]
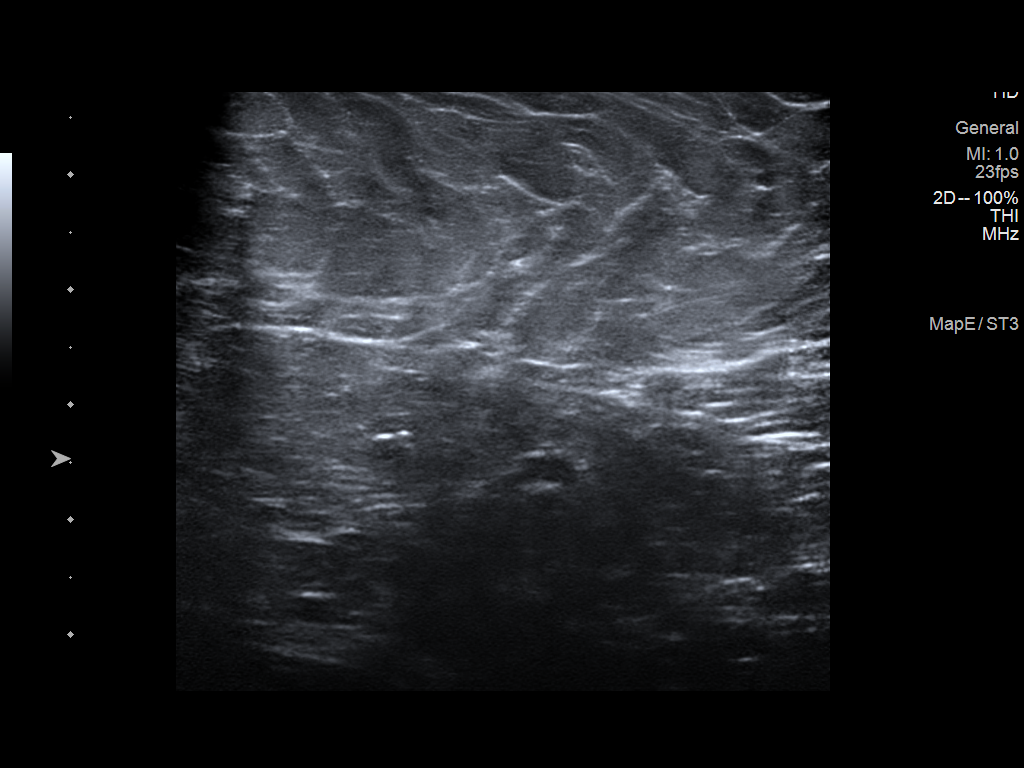
[im 7/7]
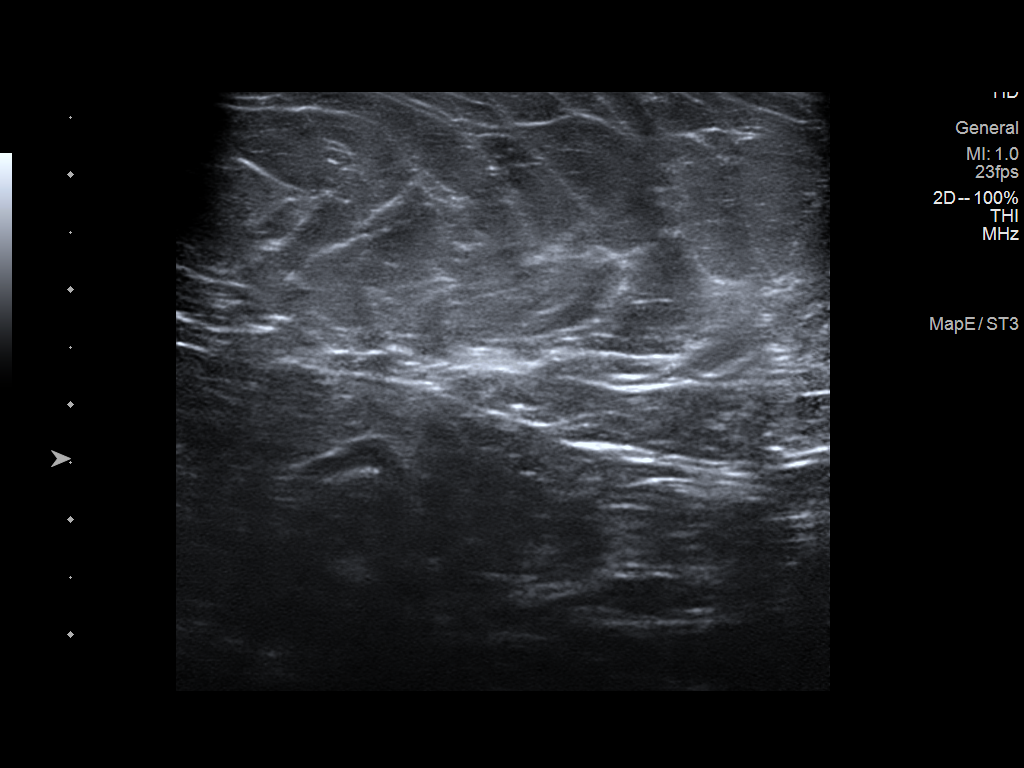

[7 of 7 positions shown; findings below may reference images not displayed]

FINDINGS: The left breast focal asymmetry has essentially resolved in the
interval, consistent with a benign process.

On physical exam, no suspicious lumps are identified.

Targeted ultrasound is performed, showing no sonographic
abnormalities in the region of the previously identified focal
asymmetry.
IMPRESSION: No mammographic or sonographic evidence of malignancy.

RECOMMENDATION:
Annual screening mammography.

Additionally, the patient's mother and 2 of her maternal aunts had
breast cancer. According to NCCN guidelines, the patient should be
assessed by a genetic counselor. If the patient is found to be
genetically positive for a breast cancer related genetic mutation or
if the patient is found have a lifetime risk of breast cancer of
greater than 20%, recommend annual breast MRI and annual
mammography.

I have discussed the findings and recommendations with the patient.
If applicable, a reminder letter will be sent to the patient
regarding the next appointment.

BI-RADS CATEGORY  2: Benign.

## 2021-06-30 NOTE — Psych (Signed)
Virtual Visit via Video Note ? ?I connected with Rebekah Silva on 06/14/21 at  9:00 AM EDT by a video enabled telemedicine application and verified that I am speaking with the correct person using two identifiers. ? ?Location: ?Patient: home ?Provider: clinical home office ?  ?I discussed the limitations of evaluation and management by telemedicine and the availability of in person appointments. The patient expressed understanding and agreed to proceed. ? ?Follow Up Instructions: ? ?  ?I discussed the assessment and treatment plan with the patient. The patient was provided an opportunity to ask questions and all were answered. The patient agreed with the plan and demonstrated an understanding of the instructions. ?  ?The patient was advised to call back or seek an in-person evaluation if the symptoms worsen or if the condition fails to improve as anticipated. ? ?I provided 240 minutes of non-face-to-face time during this encounter. ? ? ?Royetta Crochet, [facility] ? ? ?[facility] PHP THERAPIST PROGRESS NOTE ? ?Rebekah Harrier [place] ?[id number] ? ?Session Time: 9-10 ? ?Participation Level: Active ? ?Behavioral Response: CasualAlertAnxious and Depressed ? ?Type of Therapy: Group Therapy ? ?Treatment Goals addressed: Coping ? ?Progress Towards Goals: Progressing ? ?Interventions: CBT, DBT, Solution Focused, Strength-based, Supportive, and Reframing ? ?Summary: Clinician led check-in regarding current stressors and situation. Clinician utilized active listening and empathetic response and validated patient emotions. Clinician facilitated processing group on pertinent issues. ? ?Therapist Response: Rebekah Silva is a 51 y.o. female who presents with depression and anxiety symptoms. Patient arrived within time allowed and reports that she is feeling "ok." Patient rates her mood at a 6 on a scale of 1-10 with 10 being great. Pt reports she had weird dreams but good sleep. Pt states she decorated for Easter for the first time  in years and feels good about that. Pt reports concerns about going back to work. Patient able to process. Patient engaged in discussion. ?  ?  ? ?Session Time: 10:00- 11:00 ?  ?Participation Level: Active ?  ?Behavioral Response: CasualAlertDepressed ?  ?Type of Therapy: Group Therapy ?  ?Treatment Goals addressed: Coping ? ?Progress Towards Goals: Progressing ?  ?Interventions: CBT, DBT, Supportive, Reframing, and Strengths Based ?  ?Summary: Clinician led the group discussion about negative thought patterns and working with others within conflict with authority. ?  ?Therapist Response: Patient engaged in group. Patient reports she struggles with conflict with authority. Pt reports she knows she has had a wonderful career, but the actions and words of one supervisor are outweighing the other accomplishments. ?  ?  ?Session Time: 11:00 -12:00 ?  ?Participation Level: Active ?  ?Behavioral Response: CasualAlertDepressed ?  ?Type of Therapy: Group Therapy, Occupational Therapy ?  ?Treatment Goals addressed: Coping ? ?Progress Towards Goals: Progressing ?  ?Interventions: Supportive, Education ?  ?Summary:  Occupational Therapist, Tiburcio Bash, lead group. ?  ?Therapist Response: See OT note. ?  ?  ?  ?Session Time: 12:00 -1:00 ?  ?Participation Level: Active ?  ?Behavioral Response: CasualAlertDepressed ?  ?Type of Therapy: Group therapy ?  ?Treatment Goals addressed: Coping ? ?Progress Towards Goals: Progressing ?  ?Interventions: CBT; Solution focused; Supportive; Reframing ?  ?Summary: 12:00 - 12:50: Clinician continued topic of "Positive Psychology". Patients participated in trying 5 different ways to change their ?lens? to a more positive outlook. Patients identified 1 of the strategies they would be willing to try to change their "lens.?  ?12:50 -1:00 Clinician led check-out. Clinician assessed for immediate needs, medication compliance and  efficacy, and safety concerns ?  ?Therapist Response: 12:00 - 12:50:  Pt engaged in activity and reports they can utilize writing 3 gratitudes at nighttime.  ?12:50 - 1:00: At check-out, patient rates her mood at a 7 on a scale of 1-10 with 10 being great. Pt reports she is going to go for a walk and attend individual therapy. Patient demonstrates some progress as evidenced by increased self-care activities. Patient denies SI/HI/self-harm at the end of group. ? ? ? ?Suicidal/Homicidal: Nowithout intent/plan ? ?Plan: Pt will continue in PHP and medication management while continuing to work on decreasing depression symptoms, SI, and increasing the ability to self manage symptoms. ? ?Collaboration of Care: None ? ?Patient/Guardian was advised Release of Information must be obtained prior to any record release in order to collaborate their care with an outside provider. Patient/Guardian was advised if they have not already done so to contact the registration department to sign all necessary forms in order for [place] to release information regarding their care.  ? ?Consent: Patient/Guardian gives verbal consent for treatment and assignment of benefits for services provided during this visit. Patient/Guardian expressed understanding and agreed to proceed.  ? ?Diagnosis: Current moderate episode of major depressive disorder, unspecified whether recurrent ([place]) [F32.1] ?   ?1. Current moderate episode of major depressive disorder, unspecified whether recurrent ([place])   ?2. MDD (major depressive disorder), recurrent severe, without psychosis ([place])   ?3. Generalized anxiety disorder   ? ? ? ? ?Royetta Crochet, [facility] ?06/14/2021 ? ?

## 2021-07-01 ENCOUNTER — Encounter (HOSPITAL_COMMUNITY): Payer: Self-pay | Admitting: Family

## 2021-07-01 ENCOUNTER — Encounter (HOSPITAL_COMMUNITY): Payer: Self-pay

## 2021-07-01 ENCOUNTER — Other Ambulatory Visit (HOSPITAL_COMMUNITY): Payer: BC Managed Care – PPO | Admitting: Licensed Clinical Social Worker

## 2021-07-01 ENCOUNTER — Other Ambulatory Visit (HOSPITAL_COMMUNITY): Payer: BC Managed Care – PPO

## 2021-07-01 DIAGNOSIS — F332 Major depressive disorder, recurrent severe without psychotic features: Secondary | ICD-10-CM | POA: Diagnosis not present

## 2021-07-01 DIAGNOSIS — R4589 Other symptoms and signs involving emotional state: Secondary | ICD-10-CM

## 2021-07-01 DIAGNOSIS — F411 Generalized anxiety disorder: Secondary | ICD-10-CM

## 2021-07-01 NOTE — Progress Notes (Signed)
Spoke with patient via Webex video call, used 2 identifiers to correctly identify patient. States that groups are going well. Continues to have a lot of anxiety around her work. Anxious about returning and not sure what to do. Was told the psychiatrists office would charge her $175 for FMLA paperwork to be completed. She is not sure how she will pay for it but needs it. Will start IOP next week. Denies SI/HI, does hear music playing at night but no visual hallucinations. On scale 1-10 as 10 being worst she rates depression at 6 and anxiety at 8. No other issues or complaints. No side effects from medications.

## 2021-07-01 NOTE — Therapy (Signed)
[facility] ?[place], [place], [address] ?Phone: [phone]   Fax:  [phone] ? ?Occupational Therapy Treatment ? ?Patient Details  ?Name: Rebekah Silva ?MRN: [id number] ?Date of Birth: 12-26-1970 ?Referring Provider (OT): Lendon Colonel, PA ? ? ?Encounter Date: 07/01/2021 ? ? OT End of Session - 07/01/21 1401   ? ? Visit Number 11   ? Number of Visits 20   ? Date for OT Re-Evaluation 07/09/21   ? Authorization Type BCBS 1/1-12/31/2023   25.00 due toward bill   Deduct [phone] met   OOP [id number]   No Copay no Co Ins   No Auth Required   Blue E Portal   ? OT Start Time 1105   ? OT Stop Time 1205   ? OT Time Calculation (min) 60 min   ? ?  ?  ? ?  ? ? ?Past Medical History:  ?Diagnosis Date  ? Anxiety   ? Arthritis   ? ra  ? Colitis   ? Depression   ? GERD (gastroesophageal reflux disease)   ? Hypercholesterolemia   ? DR. WHITE  ? Migraines   ? PIH (pregnancy induced hypertension) yrs ago, none since  ? PONV (postoperative nausea and vomiting)   ? Wears glasses   ? ? ?Past Surgical History:  ?Procedure Laterality Date  ? ANAL FISSURE REPAIR N/A 04/09/2015  ? Procedure: ANAL CHEMICAL DENERVATION(BOTOX);  Surgeon: ARalene Ok MD;  [facility]  Service: General;  Laterality: N/A;  ? ANAL FISSURE REPAIR  07/06/2016  ? CATARACT EXTRACTION Left   ? CESAREAN SECTION  2007  ? COLONOSCOPY    ? INTRAUTERINE DEVICE INSERTION  12/15/2005  ? MOUTH SURGERY    ? RECTAL EXAM UNDER ANESTHESIA N/A 02/17/2015  ? Procedure: RECTAL EXAM UNDER ANESTHESIA;  Surgeon: ARalene Ok MD;  Location: [facility];  Service: General;  Laterality: N/A;  ? RETINAL DETACHMENT SURGERY  2019  ? X 5   ? SPHINCTEROTOMY N/A 02/17/2015  ? Procedure: LATERAL INTERNAL SPHINCTEROTOMY;  Surgeon: ARalene Ok MD;  Location: [facility];  Service: General;  Laterality: N/A;  ? ? ?There were no vitals filed for this visit. ? ? Subjective Assessment - 07/01/21 1401   ? ?  Currently in Pain? No/denies   ? Pain Score 0-No pain   ? ?  ?  ? ?  ? ? ?Group Session: ? ?S: Pt attended the group telehealth session focused on adaptation and modification strategies to improve ADLs and overall quality of life. "I am better able to now understand that the areas I have the most difficulty w/ in life often have these things in common, lack of structure or routine, etc. Looking at these areas through the lenses of "how can I change the environment or even the task to meet my abilities / my strengths and still get things done will be certainly helpful to me moving on" ? ?O:  ? ?Group's focus today was focused on how to identify deficits and strengths in order to better understand how we can use adaptive techniques and modification strategies to improve our overall ability to initiate and engage, complete our desired daily tasks. Group opened with members discussing / recapping part one of this topic which took place on 06/29/2021. ? ?Pt learned the differences of adaptive vs modification strategies and how we can use our identified strengths and deficit areas to better understand how we can engage in task restructuring so the implementation  of adaption / modification techniques are more easily identified to our specific needs and task specific.  ? ?Today the patient was alert and oriented t/out the session, with no signs of distress or discomfort. The patient was less engaged in the session today, but was able to cite strategies she learned in the previous session and has learned and plans to use going forward. 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 The patient was alert and oriented t/out this session with no signs of distress or discomfort.She actively participated in the session, sharing personal experiences of how she has used adaptation and modification  strategies in the past to improve her ADLs and quality of life while asking important questions of how she might be able to apply these strategies to the difficulties she has been experiencing associated w/ the symptoms of PTSD related to her teaching position. OT offered specific approaches of deficit identification and strength identification as a whole and then to narrow those attributes and limitations so they are task specific and easily accessible when needed. The patient was able to provided use case scenarios of how she plans to use some of these strategies going forward. The patient demonstrated a good understanding of the topic and engaged in productive discussions with the group members adding positivity and insight to the overall group milieu.   ? ?A: The patient demonstrated a fair, however more reserved level of engagement in the OT group session where the focus was on using adaptive / modification strategies, in her case the problem solving complications that stem from her PTSD and employment. She hopes to improve her performance in daily tasks, in her case successfully returning to work and overall engagement with the responsibilities and psychosocial elements that are involved in that environment. The patient was able to cite strategies she learned in the previous session and is actively and successfully using to improve her overall mental health and daily life.  ? ?P: Pt will DC from [facility] OT where today is her final session for this admission. All goals met at this time.  ? ? ? ? ? ? ? ? ? ? ? ? ? ? ? ? ? ? ? ? OT Education - 07/01/21 1401   ? ? Education Details "Mind, Body, and Soul? Navigating Life's Challenges with Adaptive Strategies and Positive Change, Pt 2/2   ? Person(s) Educated Patient   ? Methods Explanation;Demonstration   ? Comprehension Verbalized understanding   ? ?  ?  ? ?  ? ? ? OT Short Term Goals - 06/09/21 1247   ? ?  ? OT SHORT TERM GOAL #1  ? Title Pt will apply psychosocial  skills and coping mechanisms to daily activities in order to function independently and reintegrate into community.   ? Time 4   ? Period Weeks   ? Status met  ? Target Date 07/09/21   ?  ? OT SHORT TERM GOAL #2  ? Title Patient will actively engage in OT group sessions throughout duration of PHP programming, in order to promote daily structure, social engagement, and opportunities to develop and utilize adaptive strategies to maximize functional performance in preparation for safe transition and integration back into school, work, and community living.   ? Time 4   ? Period Weeks   ? Status met  ?  ? OT SHORT TERM GOAL #3  ?  Title Patient will be educated and independent on strategies to improve communication skills and making requests of others.   ? Time 4   ? Period Weeks   ? Status met  ? ?  ?  ? ?  ? ? ? ? ? ? ? ? ? ? ? Plan - 07/01/21 1402   ? ? Psychosocial Skills Coping Strategies;Routines and Behaviors;Interpersonal Interaction;Habits   ? ?  ?  ? ?  ? ? ?Patient will benefit from skilled therapeutic intervention in order to improve the following deficits and impairments:   ?  ?  ?Psychosocial Skills: Coping Strategies, Routines and Behaviors, Interpersonal Interaction, Habits ? ? ?Visit Diagnosis: ?Difficulty coping ? ? ? ?Problem List ?Patient Active Problem List  ? Diagnosis Date Noted  ? Generalized anxiety disorder 06/06/2021  ? MDD (major depressive disorder), recurrent severe, without psychosis ([place]) 05/25/2021  ? IUD (intrauterine device) in place 01/21/2016  ? Steroid long-term use 08/20/2012  ? Ulcerative colitis ([place]) 08/20/2012  ? Hypercholesterolemia 01/13/2011  ? ?OCCUPATIONAL THERAPY DISCHARGE SUMMARY ? ?Visits from Start of Care: 11 ? ?Current functional level related to goals / functional outcomes: ?Pt is independent w/ B/I ADL's, psychosocial skills / all goals met and is ready for DC.  ?  ?Remaining deficits: ?NA ?  ?Education / Equipment: ?Pt was educated on strategies to form and  implement systems and goals / routines, adaptive and modification strategies to improve overall mental health and quality of life.   ?Plan: ?Patient agrees to discharge.    ? ? ? ?Brantley Stage, OT ?07/01/2021, 2:

## 2021-07-01 NOTE — Progress Notes (Signed)
Virtual Visit via Video Note  I connected with Tracie Harrier Dehne on 07/01/21 at  9:00 AM EDT by a video enabled telemedicine application and verified that I am speaking with the correct person using two identifiers.  Location: Patient: Home Provider: Office   I discussed the limitations of evaluation and management by telemedicine and the availability of in person appointments. The patient expressed understanding and agreed to proceed.    I discussed the assessment and treatment plan with the patient. The patient was provided an opportunity to ask questions and all were answered. The patient agreed with the plan and demonstrated an understanding of the instructions.   The patient was advised to call back or seek an in-person evaluation if the symptoms worsen or if the condition fails to improve as anticipated.  I provided 15 minutes of non-face-to-face time during this encounter.   Derrill Center, NP   [facility]  Outpatient Program Discharge Summary  Mechille Varghese [id number]  Admission date: 06/13/2021 Discharge date: 07/01/2021  Reason for admission: per admission assessment note: Malvina Schadler is a 51 year old Caucasian female who presents with worsening depression and anxiety with passive suicidal ideation due to work event that she reports was blown out of proportion.  States she was having suicidal ideation due to anxiety, stress and feeling overwhelmed.  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  States she provides her self on integrity and honesty.  States she felt her principal was not fair on the matter.   Progress in Program Toward Treatment Goals: Progressing Patient attended and participated with daily group session when active and engaged participation.  Continues to deny suicidal or homicidal ideations.  Reports recently having vivid dreams unsure if it  is related to medications or stress.  She reports hearing music right before going to bed.  Does report recent sinus infection which may be attributing to her symptoms.  Patient is stepping down to intensive outpatient programming.  Currently attempting to seek and establish care with psychiatry and therapy services.  Patient remains focused on FMLA and work life balance.   Progress (rationale): Stepping down to intensive outpatient programming 07/04/2021  Collaboration of Care: Medication Management AEB patient reported she as established care with outpatient therapy services.  and Psychiatrist AEB    Patient/Guardian was advised Release of Information must be obtained prior to any record release in order to collaborate their care with an outside provider. Patient/Guardian was advised if they have not already done so to contact the registration department to sign all necessary forms in order for [place] to release information regarding their care.   Consent: Patient/Guardian gives verbal consent for treatment and assignment of benefits for services provided during this visit. Patient/Guardian expressed understanding and agreed to proceed.   Derrill Center, NP 07/01/2021

## 2021-07-04 ENCOUNTER — Other Ambulatory Visit (HOSPITAL_COMMUNITY): Payer: BC Managed Care – PPO

## 2021-07-04 ENCOUNTER — Ambulatory Visit (HOSPITAL_COMMUNITY): Payer: BC Managed Care – PPO

## 2021-07-05 ENCOUNTER — Ambulatory Visit (HOSPITAL_COMMUNITY): Payer: BC Managed Care – PPO

## 2021-07-05 ENCOUNTER — Other Ambulatory Visit (HOSPITAL_COMMUNITY): Payer: BC Managed Care – PPO | Admitting: Family

## 2021-07-05 ENCOUNTER — Encounter (HOSPITAL_COMMUNITY): Payer: Self-pay | Admitting: Psychiatry

## 2021-07-05 DIAGNOSIS — F411 Generalized anxiety disorder: Secondary | ICD-10-CM

## 2021-07-05 DIAGNOSIS — F332 Major depressive disorder, recurrent severe without psychotic features: Secondary | ICD-10-CM
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
## 2021-07-05 NOTE — Plan of Care (Signed)
Patient is an active participant in her treatment plan. ?

## 2021-07-05 NOTE — Progress Notes (Signed)
Virtual Visit via Video Note ?  ?I connected with Rebekah Silva, who prefers to go by ?Rebekah Silva? on 07/05/21 at  9:00 AM EDT by a video enabled telemedicine application and verified that I am speaking with the correct person using two identifiers. ?  ?At orientation to the IOP program, Case Manager discussed the limitations of evaluation and management by telemedicine and the availability of in person appointments. The patient expressed understanding and agreed to proceed with virtual visits throughout the duration of the program. ?  ?Location:  ?Patient: Patient Home ?Provider: Counselor Home Office ?  ?History of Present Illness: ?MDD and GAD  ?  ?Observations/Objective: ?Check In: Case Manager checked in with all participants to review discharge dates, insurance authorizations, work-related documents and needs from the treatment team regarding medications. ?Rebekah Silva? stated needs and engaged in discussion.  ?  ?Initial Therapeutic Activity: Counselor facilitated a check-in with Rebekah Silva, who prefers to go by ?Rebekah Silva? to assess for safety, sobriety and medication compliance.  Counselor also inquired about ?Rebekah Silva?'s current emotional ratings, as well as any significant changes in thoughts, feelings or behavior since previous check in.  ?Rebekah Silva? presented for session on time and was alert, oriented x5, with no evidence or self-report of active SI/HI or A/Rebekah Silva H.  ?Rebekah Silva? reported compliance with medication and denied use of alcohol or illicit substances.  ?Rebekah Silva? reported scores of 7/10 for depression, 6/10 for anxiety, and 0/10 for anger/irritability.  ?Rebekah Silva? denied any recent outbursts or panic attacks.  ?Rebekah Silva? reported that a recent success was attending a therapist appointment, spending time with her sister catching up, and painting for the first time in awhile.  ?Rebekah Silva? reported that a recent struggle has been struggling with getting to bed at night due to racing obsessional thoughts.  ?Rebekah Silva? 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     ? ?Second Therapeutic Activity: Counselor introduced [employer], [facility] Chaplain to provide psychoeducation on topic of Grief and Loss with members today.  Estill Bamberg began discussion by checking in with the group about their baseline mood today, general thoughts on what grief means to them and how it has affected them personally in the past.  Estill Bamberg provided information on how the process of grief/loss can differ depending upon one's unique culture, and categories of loss one could experience (i.e. loss of a person, animal, relationship, job, identity, etc).  Estill Bamberg encouraged members to be mindful of how pervasive loss can be, and how to recognize signs which could indicate that this is having an impact on one's overall mental health and wellbeing.  Intervention was effective, as evidenced by ?Rebekah Silva? participating in discussion with speaker on the subject, reporting that something which brings her joy and helps her cope with loss is random moments of humor that her child brings to her each day, including popping into the room to recite one liners which break tension in the household. ?Rebekah Silva? also successfully participated in a mindfulness meditation activity offered by speaker to improve present mindedness and sense of calm.     ? ?Third Therapeutic Activity: Counselor also acknowledged a graduating group member by prompting this individual to reflect on progress made, takeaways from treatment and plan for stepping down. Counselor and group members shared observations of growth, encouragement and support as graduating member transitions out of the program today.  Intervention was effective, as evidenced by ?Rebekah Silva? participating in completion  activity and offering words of encouragement and support to graduating member.   ? ?Assessment and Plan: ?Counselor recommends that ?Rebekah Silva? remain in IOP treatment to better manage mental health symptoms,  ensure stability and pursue completion of treatment plan goals. Counselor recommends adherence to crisis/safety plan, taking medications as prescribed, and following up with medical professionals if any issues arise. ?  ?Follow Up Instructions: ?Counselor will send Webex link for next session. ?Rebekah Silva? was advised to call back or seek an in-person evaluation if the symptoms worsen or if the condition fails to improve as anticipated. ?  ?Collaboration of Care:   Medication Management AEB Ricky Ala, NP  ?                                         Case Manager AEB Dellia Nims, CNA  ? ? ?Patient/Guardian was advised Release of Information must be obtained prior to any record release in order to collaborate their care with an outside provider. Patient/Guardian was advised if they have not already done so to contact the registration department to sign all necessary forms in order for [place] to release information regarding their care.  ? ?Consent: Patient/Guardian gives verbal consent for treatment and assignment of benefits for services provided during this visit. Patient/Guardian expressed understanding and agreed to proceed. ? ?I provided 180 minutes of non-face-to-face time during this encounter. ?  ?Shade Flood, LCSW, LCAS ?07/05/21  ?
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
## 2021-07-06 ENCOUNTER — Other Ambulatory Visit (HOSPITAL_COMMUNITY): Payer: BC Managed Care – PPO | Admitting: Psychiatry

## 2021-07-06 ENCOUNTER — Ambulatory Visit (HOSPITAL_COMMUNITY): Payer: BC Managed Care – PPO

## 2021-07-06 ENCOUNTER — Other Ambulatory Visit (HOSPITAL_COMMUNITY): Payer: BC Managed Care – PPO

## 2021-07-06 DIAGNOSIS — F332 Major depressive disorder, recurrent severe without psychotic features: Secondary | ICD-10-CM | POA: Diagnosis not present

## 2021-07-06 DIAGNOSIS — F411 Generalized anxiety disorder: Secondary | ICD-10-CM

## 2021-07-06 NOTE — Progress Notes (Signed)
Virtual Visit via Video Note ?  ?I connected with Ennis Forts. Torain, who prefers to go by ?V? on 07/06/21 at  9:00 AM EDT by a video enabled telemedicine application and verified that I am speaking with the correct person using two identifiers. ?  ?At orientation to the IOP program, Case Manager discussed the limitations of evaluation and management by telemedicine and the availability of in person appointments. The patient expressed understanding and agreed to proceed with virtual visits throughout the duration of the program. ?  ?Location:  ?Patient: Patient Home ?Provider: [facility] [place] Office ?  ?History of Present Illness: ?MDD and GAD  ?  ?Observations/Objective: ?Check In: Case Manager checked in with all participants to review discharge dates, insurance authorizations, work-related documents and needs from the treatment team regarding medications. ?V? stated needs and engaged in discussion.  ?  ?Initial Therapeutic Activity: Counselor facilitated a check-in with Arlinda, who prefers to go by ?V? to assess for safety, sobriety and medication compliance.  Counselor also inquired about ?V?'s current emotional ratings, as well as any significant changes in thoughts, feelings or behavior since previous check in.  ?V? presented for session on time and was alert, oriented x5, with no evidence or self-report of active SI/HI or A/V H.  ?V? reported compliance with medication and denied use of alcohol or illicit substances.  ?V? reported scores of 5/10 for depression, 5/10 for anxiety, and 2/10 for anger/irritability.  ?V? denied any recent outbursts or panic attacks.  ?V? 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  ?V? 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  ?V? reported that her goal today is to attend an appointment with her PCP about cold symptoms since  Saturday.      ? ?Second Therapeutic Activity: Counselor introduced topic of stress management today.  Counselor provided definition of stress as feeling tense, overwhelmed, worn out, and/or exhausted, and noted that in small amounts, stress can be motivating until things become too overwhelming to manage.  Counselor also explained how stress can be acute (brief but intense) or chronic (long-lasting) and this can impact the severity of symptoms one can experience in the physical, emotional, and behavioral categories.  Counselor inquired about members' specific stressors, how long they have been prevalent, and the various symptoms that tend to manifest as a result.  Counselor also offered several stress management strategies to help improve members' coping ability, including journaling, gratitude practice, relaxation techniques, and time management tips.  Counselor also explained that research has shown a strong support network composed of trusted family, friends, or community members can increase resilience in times of stress, and inquired about who members can reach out to for help in managing stressors.  Counselor encouraged members to consider discussing stressor 'red flags' with their close supports that can be monitored and strategies for assisting them in times of crisis.  Intervention was effective, as evidenced by ?V? actively participating in discussion on subject, reporting that her most significant stressors include changing jobs, pain/fatigue, money worries, loneliness, and family conflict.  ?V? was able to identify several warning signs related to stress, including grinding her teeth to the point of cracking them, muscle tension, digestive issues, headaches, sleep problems, and more.  ?V? reported that her stress management goal is to make sure to meditate each day for 10 minutes, stating ?It helps  me stay in the present moment so I'm not focused on the anxiety so much?.  ?V? also expressed  receptiveness to several stress management strategies practiced today in session, including keeping a stress tracker day to day which she can share with her therapist during sessions, progressive muscle relaxation, and deep breathing.      ? ?Assessment and Plan: ?Counselor recommends that ?V? remain in IOP treatment to better manage mental health symptoms, ensure stability and pursue completion of treatment plan goals. Counselor recommends adherence to crisis/safety plan, taking medications as prescribed, and following up with medical professionals if any issues arise. ?  ?Follow Up Instructions: ?Counselor will send Webex link for next session. ?V? was advised to call back or seek an in-person evaluation if the symptoms worsen or if the condition fails to improve as anticipated. ?  ?Collaboration of Care:   Medication Management AEB Ricky Ala, NP  ?                                         Case Manager AEB Dellia Nims, CNA  ? ? ?Patient/Guardian was advised Release of Information must be obtained prior to any record release in order to collaborate their care with an outside provider. Patient/Guardian was advised if they have not already done so to contact the registration department to sign all necessary forms in order for [place] to release information regarding their care.  ? ?Consent: Patient/Guardian gives verbal consent for treatment and assignment of benefits for services provided during this visit. Patient/Guardian expressed understanding and agreed to proceed. ? ?I provided 180 minutes of non-face-to-face time during this encounter. ?  ?Shade Flood, LCSW, LCAS ?07/06/21  ?

## 2021-07-07 ENCOUNTER — Other Ambulatory Visit (HOSPITAL_COMMUNITY): Payer: BC Managed Care – PPO

## 2021-07-07 ENCOUNTER — Other Ambulatory Visit (HOSPITAL_COMMUNITY): Payer: BC Managed Care – PPO | Admitting: Licensed Clinical Social Worker

## 2021-07-07 ENCOUNTER — Ambulatory Visit (HOSPITAL_COMMUNITY): Payer: BC Managed Care – PPO

## 2021-07-07 DIAGNOSIS — F332 Major depressive disorder, recurrent severe without psychotic features: Secondary | ICD-10-CM

## 2021-07-07 DIAGNOSIS — F411 Generalized anxiety disorder: Secondary | ICD-10-CM

## 2021-07-07 NOTE — Progress Notes (Signed)
Virtual Visit via Video Note ?  ?I connected with Rebekah Forts. Silva, who prefers to go by ?Rebekah Silva? on 07/07/21 at  9:00 AM EDT by a video enabled telemedicine application and verified that I am speaking with the correct person using two identifiers. ?  ?At orientation to the IOP program, Case Manager discussed the limitations of evaluation and management by telemedicine and the availability of in person appointments. The patient expressed understanding and agreed to proceed with virtual visits throughout the duration of the program. ?  ?Location:  ?Patient: Patient Home ?Provider: [facility] [place] [facility] ?  ?History of Present Illness: ?MDD and GAD  ?  ?Observations/Objective: ?Check In: Case Manager checked in with all participants to review discharge dates, insurance authorizations, work-related documents and needs from the treatment team regarding medications. ?Rebekah Silva? stated needs and engaged in discussion.  ?  ?Initial Therapeutic Activity: Counselor facilitated a check-in with Aryaa, who prefers to go by ?Rebekah Silva? to assess for safety, sobriety and medication compliance.  Counselor also inquired about ?Rebekah Silva?'s current emotional ratings, as well as any significant changes in thoughts, feelings or behavior since previous check in.  ?Rebekah Silva? presented for session on time and was alert, oriented x5, with no evidence or self-report of active SI/HI or A/Rebekah Silva H.  ?Rebekah Silva? reported compliance with medication and denied use of alcohol or illicit substances.  ?Rebekah Silva? reported scores of 5/10 for depression, 6/10 for anxiety, and 0/10 for anger/irritability.  ?Rebekah Silva? denied any recent outbursts.  ?Rebekah Silva? reported that a recent success was having her FMLA approved, stating ?That took a huge weight off of me?.  ?Rebekah Silva? reported that a recent struggle was experiencing a really bad anxiety attack yesterday during doctor visit, stating ?My mind was racing about 'what ifs' but the doctor helped talk me through it?.  ?Rebekah Silva? reported that her goal today is to prepare for a call from her  lawyer in the afternoon.      ? ?Second Therapeutic Activity: Counselor covered topic of distress tolerance skills today.  Counselor utilized a DBT handout which explained how distressing situations don't always have quick solutions, so the only choice is to sit with uncomfortable emotions until they pass.  Counselor offered the IMPROVE acronym as a solution to this problem, which outlined various skills (i.e. Imagery, Meaning, Prayer, Relaxation, 'One thing in the moment', Vacation, and Encouragement) that could be explored in order to improve ability to tolerate discomfort.  Counselor tasked members with identifying personalized strategies for each category which could have been implemented to handle a recent challenge more effectively.  Intervention was effective, as evidenced by ?Rebekah Silva? actively engaging in discussion on subject, reporting that she is currently going through a distressing situation due to concerns about her job, and needing to find new employment that isn't as triggering.  ?Rebekah Silva? was able to identify several strategies for handling ongoing stress in healthier ways, including beginning a tai chi routine again, imagining herself being at the beach, imagining how calm she will be when she gets through present challenge, meditating daily,  playing relaxing music while in the bath, burning essential oils, and reading an engrossing book. ? ?Assessment and Plan: ?Counselor recommends that ?Rebekah Silva? remain in IOP treatment to better manage mental health symptoms, ensure stability and pursue completion of treatment plan goals. Counselor recommends adherence to crisis/safety plan, taking medications as prescribed, and following up with medical professionals if any issues arise. ?  ?Follow Up Instructions: ?Counselor will send Webex link for next session. ?Rebekah Silva? was advised to call  back or seek an in-person evaluation if the symptoms worsen or if the condition fails to improve as anticipated. ?  ?Collaboration of Care:    Medication Management AEB Ricky Ala, NP  ?                                         Case Manager AEB Dellia Nims, CNA  ? ? ?Patient/Guardian was advised Release of Information must be obtained prior to any record release in order to collaborate their care with an outside provider. Patient/Guardian was advised if they have not already done so to contact the registration department to sign all necessary forms in order for [place] to release information regarding their care.  ? ?Consent: Patient/Guardian gives verbal consent for treatment and assignment of benefits for services provided during this visit. Patient/Guardian expressed understanding and agreed to proceed. ? ?I provided 180 minutes of non-face-to-face time during this encounter. ?  ?Shade Flood, LCSW, LCAS ?07/07/21  ?

## 2021-07-08 ENCOUNTER — Other Ambulatory Visit (HOSPITAL_COMMUNITY): Payer: BC Managed Care – PPO

## 2021-07-08 ENCOUNTER — Other Ambulatory Visit (HOSPITAL_COMMUNITY): Payer: BC Managed Care – PPO | Admitting: Licensed Clinical Social Worker

## 2021-07-08 DIAGNOSIS — F411 Generalized anxiety disorder: Secondary | ICD-10-CM

## 2021-07-08 DIAGNOSIS — F332 Major depressive disorder, recurrent severe without psychotic features: Secondary | ICD-10-CM

## 2021-07-08 NOTE — Progress Notes (Signed)
Virtual Visit via Video Note ?  ?I connected with Rebekah Forts. Silva, who prefers to go by ?Rebekah Silva? on 07/08/21 at  9:00 AM EDT by a video enabled telemedicine application and verified that I am speaking with the correct person using two identifiers. ?  ?At orientation to the IOP program, Case Manager discussed the limitations of evaluation and management by telemedicine and the availability of in person appointments. The patient expressed understanding and agreed to proceed with virtual visits throughout the duration of the program. ?  ?Location:  ?Patient: Patient Home ?Provider: [facility] ?  ?History of Present Illness: ?MDD and GAD  ?  ?Observations/Objective: ?Check In: Case Manager checked in with all participants to review discharge dates, insurance authorizations, work-related documents and needs from the treatment team regarding medications. ?Rebekah Silva? stated needs and engaged in discussion.  ?  ?Initial Therapeutic Activity: Counselor facilitated a check-in with Sameena, who prefers to go by ?Rebekah Silva? to assess for safety, sobriety and medication compliance.  Counselor also inquired about ?Rebekah Silva?'s current emotional ratings, as well as any significant changes in thoughts, feelings or behavior since previous check in.  ?Rebekah Silva? presented for session on time and was alert, oriented x5, with no evidence or self-report of active SI/HI or A/Rebekah Silva H.  ?Rebekah Silva? reported compliance with medication and denied use of alcohol or illicit substances.  ?Rebekah Silva? reported scores of 6/10 for depression, 6/10 for anxiety, and 0/10 for anger/irritability.  ?Rebekah Silva? denied any recent outbursts or panic attacks.  ?Rebekah Silva? denied any recent successes.  ?Rebekah Silva? reported that a recent struggle was sitting around the house yesterday waiting on the lawyer to call her, and not hearing anything back.  ?Rebekah Silva? reported that her goal today is to get her car inspected and stated ?I need to keep my mind preoccupied?.      ? ?Second Therapeutic Activity: Counselor covered topic of conflict  resolution today.  Counselor virtually shared a handout on subject with members which warned against the 'four horseman' of communication traps that should be avoided due to tendency to escalate and damage a relationship.  These included criticism, defensiveness, contempt, and stonewalling.  'Antidotes' to these harmful behaviors were offered as healthy replacements to improve communication and understanding, including approaching problems with a gentle startup approach, taking responsibility for one's behavior, sharing fondness/admiration, and using self-soothing to calm down and focus on the problem at hand.  Counselor encouraged members to share recent experiences with conflict that they have faced, which approach they utilized, and any changes that they would plan to implement in order to improve overall conflict resolution skills.  Intervention was effective, as evidenced by ?Rebekah Silva? actively participating in discussion on topic, reporting that they have engaged in some of these communication traps mentioned, including criticism and stonewalling.  ?Rebekah Silva? reported that this has led to consequences such as disagreements in the marriage, noting that when she was growing up her family didn't talk about problems, and encouraged compartmentalization instead, so now when she needs to address something with her husband, it can be difficult to articulate assertively.  ?Rebekah Silva? expressed receptiveness to alternative strategies offered in order to improve conflict resolution skills, including implementing ?I? statements to express her feelings in an acceptable manner, and taking timeouts when necessary if the conversation becomes too heated.  ?Rebekah Silva? reported that she believes this will be particularly helpful in setting boundaries with her mother, who can be passive aggressive and difficult to deal with.   ? ?Assessment and Plan: ?Counselor recommends that ?Rebekah Silva? remain  in IOP treatment to better manage mental health symptoms, ensure  stability and pursue completion of treatment plan goals. Counselor recommends adherence to crisis/safety plan, taking medications as prescribed, and following up with medical professionals if any issues arise. ?  ?Follow Up Instructions: ?Counselor will send Webex link for next session. ?Rebekah Silva? was advised to call back or seek an in-person evaluation if the symptoms worsen or if the condition fails to improve as anticipated. ?  ?Collaboration of Care:   Medication Management AEB Ricky Ala, NP  ?                                         Case Manager AEB Dellia Nims, CNA  ? ? ?Patient/Guardian was advised Release of Information must be obtained prior to any record release in order to collaborate their care with an outside provider. Patient/Guardian was advised if they have not already done so to contact the registration department to sign all necessary forms in order for [place] to release information regarding their care.  ? ?Consent: Patient/Guardian gives verbal consent for treatment and assignment of benefits for services provided during this visit. Patient/Guardian expressed understanding and agreed to proceed. ? ?I provided 180 minutes of non-face-to-face time during this encounter. ?  ?Shade Flood, LCSW, LCAS ?07/08/21  ?

## 2021-07-11 ENCOUNTER — Ambulatory Visit (HOSPITAL_COMMUNITY): Payer: BC Managed Care – PPO | Admitting: Psychiatry

## 2021-07-11 ENCOUNTER — Telehealth (HOSPITAL_COMMUNITY): Payer: Self-pay | Admitting: Psychiatry

## 2021-07-11 NOTE — Psych (Signed)
Virtual Visit via Video Note ? ?I connected with Rebekah Silva on 06/15/21 at  9:00 AM EDT by a video enabled telemedicine application and verified that I am speaking with the correct person using two identifiers. ? ?Location: ?Patient: home ?Provider: clinical home office ?  ?I discussed the limitations of evaluation and management by telemedicine and the availability of in person appointments. The patient expressed understanding and agreed to proceed. ? ?Follow Up Instructions: ? ?  ?I discussed the assessment and treatment plan with the patient. The patient was provided an opportunity to ask questions and all were answered. The patient agreed with the plan and demonstrated an understanding of the instructions. ?  ?The patient was advised to call back or seek an in-person evaluation if the symptoms worsen or if the condition fails to improve as anticipated. ? ?I provided 240 minutes of non-face-to-face time during this encounter. ? ? ?Royetta Crochet, [facility] ? ? ?[facility] PHP THERAPIST PROGRESS NOTE ? ?Rebekah Silva ?[id number] ? ?Session Time: 9-10 ? ?Participation Level: Active ? ?Behavioral Response: CasualAlertAnxious and Depressed ? ?Type of Therapy: Group Therapy ? ?Treatment Goals addressed: Coping ? ?Progress Towards Goals: Progressing ? ?Interventions: CBT, DBT, Solution Focused, Strength-based, Supportive, and Reframing ? ?Summary: Clinician led check-in regarding current stressors and situation. Clinician utilized active listening and empathetic response and validated patient emotions. Clinician facilitated processing group on pertinent issues. ? ?Therapist Response: Rebekah Silva is a 51 y.o. female who presents with depression and anxiety symptoms. Patient arrived within time allowed and reports that she is feeling "very anxious.? Patient rates her mood at a 4 on a scale of 1-10 with 10 being great. Pt reports she is concerned about political events happening and how they will affect her  family. Pt reports she struggles with focusing on what she can control vs what is outside of her control. Patient able to process. Patient engaged in discussion. ?  ?  ? ?Session Time: 10:00- 11:00 ?  ?Participation Level: Active ?  ?Behavioral Response: CasualAlertDepressed ?  ?Type of Therapy: Group Therapy ?  ?Treatment Goals addressed: Coping ? ?Progress Towards Goals: Progressing ?  ?Interventions: CBT, DBT, Supportive, Reframing, and Strengths Based ?  ?Summary: Clinician led the group on maintaining a work/life balance and self-care and activities patients can participate in to ?recharge.?  ?  ?Therapist Response: Patient engaged in group. Patient reports she has struggled with work/life balance in the past. Pt reports she likes to read, drive, and have quality time with family for self-care. ? ? ?Session Time: 11:00 -12:00 ?  ?Participation Level: Active ?  ?Behavioral Response: CasualAlertDepressed ?  ?Type of Therapy: Group Therapy, Spiritual Care ?  ?Treatment Goals addressed: Coping ? ?Progress Towards Goals: Progressing ?  ?Interventions: Supportive, Education ?  ?Summary:  Rebekah Silva, [place], led group. ?  ?Therapist Response: See [place] note. ?  ?  ?  ?Session Time: 11:00 -12:00 ?  ?Participation Level: Active ?  ?Behavioral Response: CasualAlertDepressed ?  ?Type of Therapy: Group Therapy, OT ?  ?Treatment Goals addressed: Coping ? ?Progress Towards Goals: Progressing ?  ?Interventions: Supportive, Education ?  ?Summary:  OT, Rebekah Silva, led group. ?  ?Therapist Response: 12:00-12:50: See OT note. ?12:50 -1:00 Clinician led check-out. Clinician assessed for immediate needs, medication compliance and efficacy, and safety concerns ?  ?Therapist Response: 12:00 - 12:50: Pt engaged in activity.  ?12:50 - 1:00: At check-out, patient rates her mood at a 6 on a scale of 1-10 with  10 being great. Pt reports she will have coffee with a friend and have a doctor's appointment. Pt demonstrates some  progress as evidenced by increased insight into what she can control vs what she cannot. Patient denies SI/HI/self-harm at the end of group. ? ? ?Suicidal/Homicidal: Nowithout intent/plan ? ?Plan: Pt will continue in PHP and medication management while continuing to work on decreasing depression symptoms, SI, and increasing the ability to self manage symptoms. ? ?Collaboration of Care: None ? ?Patient/Guardian was advised Release of Information must be obtained prior to any record release in order to collaborate their care with an outside provider. Patient/Guardian was advised if they have not already done so to contact the registration department to sign all necessary forms in order for [place] to release information regarding their care.  ? ?Consent: Patient/Guardian gives verbal consent for treatment and assignment of benefits for services provided during this visit. Patient/Guardian expressed understanding and agreed to proceed.  ? ?Diagnosis: MDD (major depressive disorder), recurrent severe, without psychosis ([place]) [F33.2] ?   ?1. MDD (major depressive disorder), recurrent severe, without psychosis ([place])   ?2. Generalized anxiety disorder   ? ? ? ? ?Royetta Crochet, [facility] ?06/15/2021 ? ?

## 2021-07-12 ENCOUNTER — Other Ambulatory Visit (HOSPITAL_COMMUNITY): Payer: BC Managed Care – PPO | Attending: Licensed Clinical Social Worker | Admitting: Licensed Clinical Social Worker

## 2021-07-12 DIAGNOSIS — F332 Major depressive disorder, recurrent severe without psychotic features: Secondary | ICD-10-CM | POA: Diagnosis present

## 2021-07-12 DIAGNOSIS — F411 Generalized anxiety disorder: Secondary | ICD-10-CM

## 2021-07-12 NOTE — Progress Notes (Signed)
Virtual Visit via Video Note ?  ?I connected with Ennis Forts. Engelken, who prefers to go by ?V? on 07/12/21 at  9:00 AM EDT by a video enabled telemedicine application and verified that I am speaking with the correct person using two identifiers. ?  ?At orientation to the IOP program, Case Manager discussed the limitations of evaluation and management by telemedicine and the availability of in person appointments. The patient expressed understanding and agreed to proceed with virtual visits throughout the duration of the program. ?  ?Location:  ?Patient: Patient Home ?Provider: [facility] [place] [facility] ?  ?History of Present Illness: ?MDD and GAD  ?  ?Observations/Objective: ?Check In: Case Manager checked in with all participants to review discharge dates, insurance authorizations, work-related documents and needs from the treatment team regarding medications. ?V? stated needs and engaged in discussion.  ?  ?Initial Therapeutic Activity: Counselor facilitated a check-in with ?V? to assess for safety, sobriety and medication compliance.  Counselor also inquired about ?V?'s current emotional ratings, as well as any significant changes in thoughts, feelings or behavior since previous check in.  ?V? presented for session on time and was alert, oriented x5, with no evidence or self-report of active SI/HI or A/V H.  ?V? reported compliance with medication and denied use of alcohol.  ?V? reported scores of 5/10 for depression, 3/10 for anxiety, and 0/10 for anger/irritability.  ?V? denied any recent outbursts or panic attacks.  ?V? reported that a recent success was starting a new painting.  ?V? reported that a recent struggle was smoking pot over the weekend when feeling anxious, stating ?It was the first time since I was in the hospital, and I was trying to get relaxed?Marland Kitchen  She denied any consequences of this use.  ?V? reported that her goal today is to continue working on her new painting as a healthy outlet for her depression.     ? ?Second Therapeutic Activity: Counselor introduced topic of self-care today.  Counselor explained how this can be defined as the things one does to maintain good health and improve well-being.  Counselor provided members with a self-care assessment form to complete.  This handout featured various sub-categories of self-care, including physical, psychological/emotional, social, spiritual, and professional.  Members were asked to rank their engagement in the activities listed for each dimension on a scale of 1-3, with 1 indicating 'Poor', 2 indicating '[place]', and 3 indicating 'Well'.  Counselor invited members to share results of their assessment, and inquired about which areas of self-care they are doing well in, as well as areas that require attention, and how they plan to begin addressing this during treatment.  Intervention was effective, as evidenced by ?V? successfully completing initial 2 sections of assessment and actively engaging in discussion on subject, reporting that she is excelling in areas such as getting enough sleep, going to preventative medical appointments, resting when sick, getting away from distractions, and participating in hobbies, but would benefit from focusing more on areas such as maintaining personal hygiene, exercising, wearing clothes that make her feel good, participating in fun activities, going on vacations or daytrips, recognizing strengths and achievements, or learning new things unrelated to work or school.  ?V? reported that she would work to improve self-care deficits by developing an exercise routine that is motivating with help from a supportive friend or family member, treating herself to some nice new clothes to boost self-esteem, looking into local water aerobics or dance classes, giving herself more credit for achievements such as  advocating for causes she considers meaningful in the community, singing, looking into calligraphy as a hobby, or taking a daytrip somewhere  like the natural science center or zoo.   ? ?Assessment and Plan: ?Counselor recommends that ?V? remain in IOP treatment to better manage mental health symptoms, ensure stability and pursue completion of treatment plan goals. Counselor recommends adherence to crisis/safety plan, taking medications as prescribed, and following up with medical professionals if any issues arise. ?  ?Follow Up Instructions: ?Counselor will send Webex link for next session. ?V? was advised to call back or seek an in-person evaluation if the symptoms worsen or if the condition fails to improve as anticipated. ?  ?Collaboration of Care:   Medication Management AEB Ricky Ala, NP  ?                                         Case Manager AEB Dellia Nims, CNA  ? ? ?Patient/Guardian was advised Release of Information must be obtained prior to any record release in order to collaborate their care with an outside provider. Patient/Guardian was advised if they have not already done so to contact the registration department to sign all necessary forms in order for [place] to release information regarding their care.  ? ?Consent: Patient/Guardian gives verbal consent for treatment and assignment of benefits for services provided during this visit. Patient/Guardian expressed understanding and agreed to proceed. ? ?I provided 180 minutes of non-face-to-face time during this encounter. ?  ?Shade Flood, LCSW, LCAS ?07/12/21  ?

## 2021-07-13 ENCOUNTER — Other Ambulatory Visit (HOSPITAL_COMMUNITY): Payer: BC Managed Care – PPO | Admitting: Licensed Clinical Social Worker

## 2021-07-13 DIAGNOSIS — F332 Major depressive disorder, recurrent severe without psychotic features: Secondary | ICD-10-CM

## 2021-07-13 DIAGNOSIS — F411 Generalized anxiety disorder: Secondary | ICD-10-CM

## 2021-07-13 NOTE — Progress Notes (Signed)
Virtual Visit via Video Note ?  ?I connected with Rebekah Silva, who prefers to go by ?Rebekah Silva? on 07/13/21 at  9:00 AM EDT by a video enabled telemedicine application and verified that I am speaking with the correct person using two identifiers. ?  ?At orientation to the IOP program, Case Manager discussed the limitations of evaluation and management by telemedicine and the availability of in person appointments. The patient expressed understanding and agreed to proceed with virtual visits throughout the duration of the program. ?  ?Location:  ?Patient: Patient Home ?Provider: [facility] ?  ?History of Present Illness: ?MDD and GAD  ?  ?Observations/Objective: ?Check In: Case Manager checked in with all participants to review discharge dates, insurance authorizations, work-related documents and needs from the treatment team regarding medications. ?Rebekah Silva? stated needs and engaged in discussion.  ?  ?Initial Therapeutic Activity: Counselor facilitated a check-in with ?Rebekah Silva? to assess for safety, sobriety and medication compliance.  Counselor also inquired about ?Rebekah Silva?'s current emotional ratings, as well as any significant changes in thoughts, feelings or behavior since previous check in.  ?Rebekah Silva? presented for session on time and was alert, oriented x5, with no evidence or self-report of active SI/HI or A/Rebekah Silva H.  ?Rebekah Silva? reported compliance with medication and denied use of alcohol or illicit substances.  ?Rebekah Silva? reported scores of 3/10 for depression, 5/10 for anxiety, and 3/10 for irritability.  ?Rebekah Silva? denied any recent outbursts or panic attacks.  ?Rebekah Silva? reported that a recent success was visiting the [place] to do some research with her daughter.  ?Rebekah Silva? reported that a current struggle is dealing with triggers that remind her of her job, such as seeing school buses.  ?Rebekah Silva? reported that her goal today is to help babysit her sick nephew.   ? ?Second Therapeutic Activity: Counselor introduced Einar Grad, [employer] Pharmacist, to provide  psychoeducation on topic of medication compliance with members today.  Jiles Garter provided psychoeducation on classes of medications such as antidepressants, antipsychotics, what symptoms they are intended to treat, and any side effects one might encounter while on a particular prescription.  Time was allowed for clients to ask any questions they might have of [facility] regarding this specialty.  Intervention effectiveness could not be measured, as ?Rebekah Silva? did not participate in discussion with speaker.   ? ?Third Therapeutic Activity: Counselor introduced topic of 'urge surfing' today.  Counselor explained how this technique can be used to avoid acting upon a behavior that needs to be reduced or stopped completely.  Counselor provided common examples of maladaptive behaviors, such as smoking, overeating, substance use, excessive spending, lashing out emotionally, and more.  Counselor explained how urges rarely last more than 30 minutes if they are not ruminated upon, and attempts to fight or suppress the urge can ultimately cause the problem to grow.  Counselor guided members through practice of combination mindfulness, relaxation, and visualization exercises in order to ?surf? an urge of their concern until it faded.  Intervention was effective, as evidenced by ?Rebekah Silva? participating in urge surfing activity and reporting that she felt more relaxed afterward, stating ?I meditate fairly regularly.  For me it's a matter of bringing it back to the moment, and letting go of my thoughts so I can just be present?.   ? ?Assessment and Plan: ?Counselor recommends that ?Rebekah Silva? remain in IOP treatment to better manage mental health symptoms, ensure stability and pursue completion of treatment plan goals. Counselor recommends adherence to crisis/safety plan, taking medications as prescribed, and following up  with medical professionals if any issues arise. ?  ?Follow Up Instructions: ?Counselor will send Webex link for next session. ?Rebekah Silva? was  advised to call back or seek an in-person evaluation if the symptoms worsen or if the condition fails to improve as anticipated. ?  ?Collaboration of Care:   Medication Management AEB Ricky Ala, NP  ?                                         Case Manager AEB Dellia Nims, CNA  ? ? ?Patient/Guardian was advised Release of Information must be obtained prior to any record release in order to collaborate their care with an outside provider. Patient/Guardian was advised if they have not already done so to contact the registration department to sign all necessary forms in order for [place] to release information regarding their care.  ? ?Consent: Patient/Guardian gives verbal consent for treatment and assignment of benefits for services provided during this visit. Patient/Guardian expressed understanding and agreed to proceed. ? ?I provided 180 minutes of non-face-to-face time during this encounter. ?  ?Shade Flood, LCSW, LCAS ?07/13/21  ?

## 2021-07-13 NOTE — Psych (Signed)
Virtual Visit via Video Note ? ?I connected with Tracie Harrier Mallo on 06/09/21 at  9:00 AM EDT by a video enabled telemedicine application and verified that I am speaking with the correct person using two identifiers. ? ?Location: ?Patient: patient home ?Provider: clinical home office ?  ?I discussed the limitations of evaluation and management by telemedicine and the availability of in person appointments. The patient expressed understanding and agreed to proceed. ? ?I discussed the assessment and treatment plan with the patient. The patient was provided an opportunity to ask questions and all were answered. The patient agreed with the plan and demonstrated an understanding of the instructions. ?  ?The patient was advised to call back or seek an in-person evaluation if the symptoms worsen or if the condition fails to improve as anticipated. ? ?Pt was provided 240 minutes of non-face-to-face time during this encounter. ? ? ?Lorin Glass, LCSW ? ? ?[facility] THERAPIST PROGRESS NOTE ? ?Tracie Harrier Buist ?[id number] ? ?Session Time: 9:00 - 10:00 ? ?Participation Level: Active ? ?Behavioral Response: CasualAlertDepressed ? ?Type of Therapy: Group Therapy ? ?Treatment Goals addressed: Coping ? ?Progress Towards Goals: Initial ? ?Interventions: CBT, DBT, Supportive, and Reframing ? ?Summary: Clinician led check-in regarding current stressors and situation. Clinician utilized active listening and empathetic response and validated patient emotions. Clinician facilitated processing group on pertinent issues. ? ?Therapist Response: Kelcie Currie is a 51 y.o. female who presents with depression and anxiety symptoms. Patient arrived within time allowed and reports that she is feeling "anxious." Patient rates her mood at a 5 on a scale of 1-10 with 10 being great. Pt reports she is feeling stressed regarding paperwork and details for being on work leave. Pt states she had an infusion yesterday and rested afterwards. Pt  reports poor sleep and racing thoughts. Pt able to process. Pt engaged in discussion. ? ? ? ?Session Time: 10:00 - 11:00 ?  ?Participation Level: Active ?  ?Behavioral Response: CasualAlertDepressed ?  ?Type of Therapy: Group Therapy ?  ?Treatment Goals addressed: Coping ?  ?Interventions: CBT, DBT, Supportive and Reframing ?  ?Summary: Cln led discussion on accountability and the balance between taking responsibility and not beating ourselves up. Group members shared current consequences they are dealing with and how they are processing them. Cln encouraged pt's to utilize the Best Friend Test to make it easier to offer themselves kindness.  ?   ?Therapist Response: Pt engaged in discussion and is able to process.  ? ?  ?  ?  ?  ?Session Time: 11:00- 12:00 ?  ?Participation Level: Active ?  ?Behavioral Response: CasualAlertDepressed ?  ?Type of Therapy: Group Therapy ?  ?Treatment Goals addressed: Coping ?  ?Interventions: Skills training ?  ?Summary: OT group with cln, B. Murray on the circle of control ?  ?Therapist Response: Pt engaged in discussion.  ?  ?  ?  ?  ?Session Time: 12:00 -1:00 ?  ?Participation Level: Active ?  ?Behavioral Response: CasualAlertDepressed ?  ?Type of Therapy: Group therapy ?  ?Treatment Goals addressed: Coping ?  ?Interventions: CBT; Solution focused; Supportive; Reframing ?  ?Summary: 12:00 - 12:50: Cln continued topic of DBT distress tolerance skills. Cln introduced Self-Soothe skills. Group discussed ways they can utilize the five senses to soothe themselves when struggling.  ?12:50 -1:00 Clinician led check-out. Clinician assessed for immediate needs, medication compliance and efficacy, and safety concerns ?  ?Therapist Response: 12:00 - 12:50: Pt engaged in discussion and reports ways they can practice  the skills. ?12:50 - 1:00: At check-out, patient rates her mood at a 7 on a scale of 1-10 with 10 being great. Pt reports afternoon plans of taking a walk and doing paperwork. Pt  demonstrates some progress as evidenced by participating in first group session. Patient denies SI/HI at the end of group. ? ? ? ?Suicidal/Homicidal: Nowithout intent/plan ? ?Plan: Pt will continue in PHP while working to decrease depression and anxiety symptoms, increase daily functioning, and increase ability to manage symptoms in a healthy manner. ? ?Collaboration of Care: Medication Management AEB T. lewis ? ?Patient/Guardian was advised Release of Information must be obtained prior to any record release in order to collaborate their care with an outside provider. Patient/Guardian was advised if they have not already done so to contact the registration department to sign all necessary forms in order for [place] to release information regarding their care.  ? ?Consent: Patient/Guardian gives verbal consent for treatment and assignment of benefits for services provided during this visit. Patient/Guardian expressed understanding and agreed to proceed.  ? ?Diagnosis: MDD (major depressive disorder), recurrent severe, without psychosis ([place]) [F33.2] ?   ?1. MDD (major depressive disorder), recurrent severe, without psychosis ([place])   ?2. Generalized anxiety disorder   ? ? ? ? ?Lorin Glass, LCSW ? ? ?

## 2021-07-14 ENCOUNTER — Other Ambulatory Visit (HOSPITAL_COMMUNITY): Payer: BC Managed Care – PPO | Admitting: Licensed Clinical Social Worker

## 2021-07-14 DIAGNOSIS — F332 Major depressive disorder, recurrent severe without psychotic features: Secondary | ICD-10-CM | POA: Diagnosis not present

## 2021-07-14 DIAGNOSIS — F411 Generalized anxiety disorder: Secondary | ICD-10-CM

## 2021-07-14 NOTE — Progress Notes (Signed)
Virtual Visit via Video Note ?  ?I connected with Ennis Forts. Catarino, who prefers to go by ?V? on 07/14/21 at  9:00 AM EDT by a video enabled telemedicine application and verified that I am speaking with the correct person using two identifiers. ?  ?At orientation to the IOP program, Case Manager discussed the limitations of evaluation and management by telemedicine and the availability of in person appointments. The patient expressed understanding and agreed to proceed with virtual visits throughout the duration of the program. ?  ?Location:  ?Patient: Patient Home ?Provider: [facility] ?  ?History of Present Illness: ?MDD and GAD  ?  ?Observations/Objective: ?Check In: Case Manager checked in with all participants to review discharge dates, insurance authorizations, work-related documents and needs from the treatment team regarding medications. ?V? stated needs and engaged in discussion.  ?  ?Initial Therapeutic Activity: Counselor facilitated a check-in with ?V? to assess for safety, sobriety and medication compliance.  Counselor also inquired about ?V?'s current emotional ratings, as well as any significant changes in thoughts, feelings or behavior since previous check in.  ?V? presented for session on time and was alert, oriented x5, with no evidence or self-report of active SI/HI or A/V H.  ?V? reported compliance with medication and denied use of alcohol or illicit substances.  ?V? reported scores of 3/10 for depression, 3/10 for anxiety, and 0/10 for anger/irritability.  ?V? denied any recent outbursts or panic attacks.  ?V? 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  ?V? denied any new struggles.  She reported that her goal today is to grab groceries this afternoon.     ? ?Second Therapeutic Activity: Counselor covered topic of attachment styles today.  Counselor virtually shared a  handout with the group on this topic which defined attachment styles as how people think about and behave in relationships.  Styles were broken down by category, including secure attachment where one believes close relationships are trustworthy, compared to insecure attachment (i.e. anxious, avoidant, or anxious-avoidant) where one is distrusting or worries about their bond with others.  Counselor inquired about which attachment style members most related to, how this has influenced their mental health/well-being, and whether they intend to begin making any changes.  Intervention was effective, as evidenced by ?V? participating in discussion, and reporting that she most identified with the secure attachment style due to positive traits with her partner such as having little difficult resolving conflict between them that arises, being accepting of their differences, and showing a healthy degree of independence.  ?V? reported that they would appreciate more affection from her partner at times, as this would alleviate some occasional fears of them abruptly leaving her.  ?V? reported that by discussing this subject, she realized that she has an anxious attachment style towards her job, noting that she is afraid of abandonment, rejection, and conflict related to her boss, sensitive to criticism, and hungry for approval.  ?V? stated ?I'm realizing it's the unhealthy attachment to my job that is the primary source of my depression.  I lost myself in my job, and I need to set better boundaries to fix this.  This has been very revealing to me today?.   ? ?Assessment and Plan: ?Counselor recommends that ?V? remain in IOP treatment to better manage mental health symptoms, ensure stability and pursue completion of treatment plan goals. Counselor recommends  adherence to crisis/safety plan, taking medications as prescribed, and following up with medical professionals if any issues arise. ?  ?Follow Up Instructions: ?Counselor  will send Webex link for next session. ?V? was advised to call back or seek an in-person evaluation if the symptoms worsen or if the condition fails to improve as anticipated. ?  ?Collaboration of Care:   Medication Management AEB Ricky Ala, NP  ?                                         Case Manager AEB Dellia Nims, CNA  ? ? ?Patient/Guardian was advised Release of Information must be obtained prior to any record release in order to collaborate their care with an outside provider. Patient/Guardian was advised if they have not already done so to contact the registration department to sign all necessary forms in order for [place] to release information regarding their care.  ? ?Consent: Patient/Guardian gives verbal consent for treatment and assignment of benefits for services provided during this visit. Patient/Guardian expressed understanding and agreed to proceed. ? ?I provided 180 minutes of non-face-to-face time during this encounter. ?  ?Shade Flood, LCSW, LCAS ?07/14/21  ?

## 2021-07-15 ENCOUNTER — Other Ambulatory Visit (HOSPITAL_COMMUNITY): Payer: BC Managed Care – PPO | Admitting: Licensed Clinical Social Worker

## 2021-07-15 DIAGNOSIS — F332 Major depressive disorder, recurrent severe without psychotic features: Secondary | ICD-10-CM | POA: Diagnosis not present

## 2021-07-15 DIAGNOSIS — F411 Generalized anxiety disorder: Secondary | ICD-10-CM

## 2021-07-15 NOTE — Progress Notes (Signed)
Virtual Visit via Video Note ?  ?I connected with Rebekah Forts. Silva, who prefers to go by ?Rebekah Silva? on 07/15/21 at  9:00 AM EDT by a video enabled telemedicine application and verified that I am speaking with the correct person using two identifiers. ?  ?At orientation to the IOP program, Case Manager discussed the limitations of evaluation and management by telemedicine and the availability of in person appointments. The patient expressed understanding and agreed to proceed with virtual visits throughout the duration of the program. ?  ?Location:  ?Patient: Patient Home ?Provider: [facility] ?  ?History of Present Illness: ?MDD and GAD  ?  ?Observations/Objective: ?Check In: Case Manager checked in with all participants to review discharge dates, insurance authorizations, work-related documents and needs from the treatment team regarding medications. ?Rebekah Silva? stated needs and engaged in discussion.  ?  ?Initial Therapeutic Activity: Counselor facilitated a check-in with ?Rebekah Silva? to assess for safety, sobriety and medication compliance.  Counselor also inquired about ?Rebekah Silva?'s current emotional ratings, as well as any significant changes in thoughts, feelings or behavior since previous check in.  ?Rebekah Silva? presented for session on time and was alert, oriented x5, with no evidence or self-report of active SI/HI or A/Rebekah Silva H.  ?Rebekah Silva? reported compliance with medication and denied use of alcohol or illicit substances.  ?Rebekah Silva? reported scores of 3/10 for depression, 2/10 for anxiety, and 0/10 for anger/irritability.  ?Rebekah Silva? denied any recent outbursts or panic attacks.  ?Rebekah Silva? reported that a recent struggle was experiencing ?Misplaced irritability that lingered yesterday?.  ?Rebekah Silva? reported that her daughter called her for assistance, and this caused her plans to change abruptly, which triggered her irritability and led her to become more fixated on annoyances that arose.  ?Rebekah Silva? reported that a recent success was helping a homeless man get to his destination  in order to do a good deed.  ?Rebekah Silva? reported that her goal is to attend a birthday for a friend's child, as well as an award ceremony for a family member.     ? ?Second Therapeutic Activity: Counselor introduced topic of self-esteem today and defined this as the value an individual places on oneself, based upon assessment of personal worth as a human being and approval/disapproval of one's behavior. Counselor asked members to assess their level of self-esteem at this time based upon common indicators of high self-esteem, including: accepting oneself unconditionally;  having self-respect and deep seated belief that one matters; being unaffected by other people's opinions/criticisms; and showing good control over emotions.  Counselor also explained concept of one's inner critic which serves to highlight faults and minimize strengths, directly influencing low sense of self-esteem.  Counselor then provided handout on 'strengths and qualities', which featured questions to guide discussion and increase awareness of each member's unique individual abilities which could reinforce higher self-esteem. Examples of questions included: 'things I am good at', 'challenges I have overcome', and 'what I like about myself'.  Intervention was effective, as evidenced by ?Rebekah Silva? actively engaging in discussion on topic, and completing a self-esteem assessment, receiving a score which indicated a 'good' level of self-esteem at this time due to traits such as taking calculated risks with confidence, being creative artistically, avoiding self-centeredness or arrogance, assuming responsibility for her life and personal choices.  ?Rebekah Silva? reported that there are some areas she would like to work on, including accepting who she is including flaws, feeling proud of personal accomplishments, reducing passivity around others, and feeling less anxious during interactions.  ?Rebekah Silva? was receptive to several  strategies offered today for increasing self-esteem  during treatment, including getting new additions to wardrobe or getting a haircut to boost appearance, keeping a record of personal accomplishments related to academic/career successes, recognizing person strengths such as creativity, empathy, kindness, patience, love of learning, forgiveness, and fairness; engaging in simple indulgences such as pedicures, and staying away from negative people that bring her down emotionally.   ? ?Assessment and Plan: ?Counselor recommends that ?Rebekah Silva? remain in IOP treatment to better manage mental health symptoms, ensure stability and pursue completion of treatment plan goals. Counselor recommends adherence to crisis/safety plan, taking medications as prescribed, and following up with medical professionals if any issues arise. ?  ?Follow Up Instructions: ?Counselor will send Webex link for next session. ?Rebekah Silva? was advised to call back or seek an in-person evaluation if the symptoms worsen or if the condition fails to improve as anticipated. ?  ?Collaboration of Care:   Medication Management AEB Ricky Ala, NP  ?                                         Case Manager AEB Dellia Nims, CNA  ? ? ?Patient/Guardian was advised Release of Information must be obtained prior to any record release in order to collaborate their care with an outside provider. Patient/Guardian was advised if they have not already done so to contact the registration department to sign all necessary forms in order for [place] to release information regarding their care.  ? ?Consent: Patient/Guardian gives verbal consent for treatment and assignment of benefits for services provided during this visit. Patient/Guardian expressed understanding and agreed to proceed. ? ?I provided 180 minutes of non-face-to-face time during this encounter. ?  ?Shade Flood, LCSW, LCAS ?07/15/21  ?

## 2021-07-18 ENCOUNTER — Other Ambulatory Visit (HOSPITAL_COMMUNITY): Payer: BC Managed Care – PPO | Admitting: Professional

## 2021-07-18 DIAGNOSIS — F332 Major depressive disorder, recurrent severe without psychotic features: Secondary | ICD-10-CM | POA: Diagnosis not present

## 2021-07-18 DIAGNOSIS — F411 Generalized anxiety disorder: Secondary | ICD-10-CM

## 2021-07-19 ENCOUNTER — Ambulatory Visit (HOSPITAL_COMMUNITY): Payer: BC Managed Care – PPO | Admitting: Psychiatry

## 2021-07-19 ENCOUNTER — Telehealth (HOSPITAL_COMMUNITY): Payer: Self-pay | Admitting: Psychiatry

## 2021-07-19 NOTE — Progress Notes (Signed)
Virtual Visit via Video Note ?  ?I connected with Rebekah Silva. Heying, who prefers to go by ?V? on 07/15/21 at  9:00 AM EDT by a video enabled telemedicine application and verified that I am speaking with the correct person using two identifiers. ?  ?At orientation to the IOP program, Case Manager discussed the limitations of evaluation and management by telemedicine and the availability of in person appointments. The patient expressed understanding and agreed to proceed with virtual visits throughout the duration of the program. ?  ?Location:  ?Patient: Patient Home ?Provider: Counselor Home Office ?  ?History of Present Illness: ?MDD and GAD  ?  ?Observations/Objective: ?Check In: Case Manager checked in with all participants to review discharge dates, insurance authorizations, work-related documents and needs from the treatment team regarding medications. ?V? stated needs and engaged in discussion.  ?  ?Initial Therapeutic Activity: Counselor facilitated a check-in with ?V? to assess for safety, sobriety and medication compliance.  Counselor also inquired about ?V?'s current emotional ratings, as well as any significant changes in thoughts, feelings or behavior since previous check in.  ?V? presented for session on time and was alert, oriented x5, with no evidence or self-report of active SI/HI or A/V H.  ?V? reported compliance with medication and denied use of alcohol or illicit substances.   Patient rates her mood at an 5 on a scale of 1-10 with 10 being great. Pt reports she spent the weekend watching movies with her daughter. Pt reports she is concerned about her mental health issues effecting her daughter. Pt reports she is constantly questioning if she is doing the right thing as a parent. Patient able to process. Patient engaged in discussion     ? ?Second Therapeutic Activity: Clinician introduced "Mindfulness". Group discussed "What" and "How" skills of mindfulness. Patients identified what types of  activities would help them practice mindfulness. Group discussed practicing mindfulness during "autopilot" activities such as driving, showering, eating, or doing chores. Group identified timed-practice skills for mindfulness such as doing puzzles, coloring, meditation, PMR, and 5-4-3-2-1.  ? ?Assessment and Plan: ?Counselor recommends that ?V? remain in IOP treatment to better manage mental health symptoms, ensure stability and pursue completion of treatment plan goals. Counselor recommends adherence to crisis/safety plan, taking medications as prescribed, and following up with medical professionals if any issues arise. ?  ?Follow Up Instructions: ?Counselor will send Webex link for next session. ?V? was advised to call back or seek an in-person evaluation if the symptoms worsen or if the condition fails to improve as anticipated. ?  ?Collaboration of Care:   Case Manager AEB Dellia Nims, CNA  ? ? ?Patient/Guardian was advised Release of Information must be obtained prior to any record release in order to collaborate their care with an outside provider. Patient/Guardian was advised if they have not already done so to contact the registration department to sign all necessary forms in order for [place] to release information regarding their care.  ? ?Consent: Patient/Guardian gives verbal consent for treatment and assignment of benefits for services provided during this visit. Patient/Guardian expressed understanding and agreed to proceed. ? ?I provided 180 minutes of non-face-to-face time during this encounter. ?  ?Loistine Chance, [facility] ?07/18/21 ?

## 2021-07-20 ENCOUNTER — Other Ambulatory Visit (HOSPITAL_COMMUNITY): Payer: Self-pay | Admitting: Family

## 2021-07-20 ENCOUNTER — Other Ambulatory Visit (HOSPITAL_COMMUNITY): Payer: BC Managed Care – PPO | Admitting: Psychiatry

## 2021-07-20 NOTE — Patient Instructions (Signed)
D:  Patient completed MH-IOP today.  A:  Discharge today.  Follow up with Noel Gerold, NP on 07-26-21 and Davonna Belling, LMFT-A on 08-09-21.  Strongly recommended groups at [facility].  Continue attending the Mom's with Trans Kids Group weekly.  R:  Patient receptive. ?

## 2021-07-20 NOTE — Progress Notes (Signed)
Virtual Visit via Video Note ? ?I connected with Rebekah Silva on @TODAY @ at  9:00 AM EDT by a video enabled telemedicine application and verified that I am speaking with the correct person using two identifiers. ? ?Location: ?Patient: at home ?Provider: at office ?  ?I discussed the limitations of evaluation and management by telemedicine and the availability of in person appointments. The patient expressed understanding and agreed to proceed. ? ?I discussed the assessment and treatment plan with the patient. The patient was provided an opportunity to ask questions and all were answered. The patient agreed with the plan and demonstrated an understanding of the instructions. ?  ?The patient was advised to call back or seek an in-person evaluation if the symptoms worsen or if the condition fails to improve as anticipated. ? ?I provided 20 minutes of non-face-to-face time during this encounter. ? ? ?[place], [place], [place] ? ? ?Patient ID: Rebekah Silva, female   DOB: 29-Jan-1971, 51 y.o.   MRN: [id number] ?As per previous CCA states:  "Pt reports for PHP per inpatient stepdown. Pt reports stressors: 1) Anxiety, Mental Health; SI: recent inpt stay 2) Job: Applying for new jobs. 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 Pt reports principal had ?a negative relationship with the prior theater teacher and I feel like he has been looking for a reason for me to screw up.? 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 People know me. I have a good reputation.? Pt reports she took the kids on a fieldtrip and incidents got magnified and principal was ready to take to HR over them. ?I felt like I was a kid trying to please her parent. And there was no connection.? Pt reports ?teaching has been my passion for me for so long.? 4) ?My daughter is trans. She is 15.?  ?She is who she is and I will never turn away from that. My parents have called me a bad parent for allowing her to be herself. I feel like I?ve lost my Mom though she?s not gone.? ?We weren?t allowed to come down for Christmas.? Pt reports treatment history includes 2 years of therapy between 2016-2018 and 2 visits with a virtual therapist this year; and 1 hospitalization March 2023 for SI with plan of running car off road. Pt denies suicide attempts. Pt reports continued passive SI and denies HI/AVH. Pt reports self-harm acts of pulling hair and biting nails until her fingers bleed. Pt reports medical diagnosis include Ulcerative Colitis and requires infusions for treatment; pt reports surgery for rectal tear 4x last year. Pt reports family history includes mother with MDD and panic disorder. ?  ?Current Symptoms/Problems: increased anxiety; racing thoughts; SI; avoidance; hopelessness; worthlessness; heart racing; picking hair and chewing nails; 30lb weight loss; decreased appetite; interrupted sleep; decreased sleep; decreased ADLs (?I don?t shower when I should. That?s embarrassing. I didn?t shower the whole time I was in the hospital. I showered the second day after I got home.? ?I will go a week or 2 without showering at home. I still go to work.? Not brushing teeth regularly. Spouse and daughter help with cooking and cleaning.) anhedonia; work problems; poor concentration; decreased sexual interest." ?  ?Pt transitioned from PHP to [place] today.  Reports PHP went well.  Pt continues to be struggling with the thought of returning to work.  Pt inquiring if MH-IOP can writer her out for the rest of the school year.  On a scale of 1-10 (10 being the worst), pt rates her depression at a 6 and anxiety at a 8.  Denies V/hallucinations; but admits to hearing music play at night.  Denies SI/HI.   ? ?Pt completed all days in MH-IOP.  Pt reports that she continues to struggle with anxiety.  On a scale of 1-10 (10 being  the worst), pt rated her anxiety at a 5 and depression at a 4 today.  Denies SI/HI or A/V hallucinations.  Denies paranoia.  Reports she [place] working with [place] this morning.  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 ?A:  Discharge today.  F/U with Noel Gerold, NP on 07-26-21 and Davonna Belling, LMFT-A on 08-09-21.  Strongly recommended groups at [facility].  Pt will continue attending the moms with trans kids group weekly.  Pt was advised of ROI must be obtained prior to any records release in order to collaborate her care with an outside provider.  Pt was advised if she has not already done so to contact the front desk to sign all necessary forms in order for MH-IOP to release info re: her care.  ?Consent:  Pt gives verbal consent for tx and assignment of benefits for services provided during this telehealth group process.  Pt expressed understanding and agreed to proceed. ?Collaboration of care:  Collaborate with Ricky Ala, NP, group leader Lorin Glass, [place]), PCP (Dr. Harlan Stains), Noel Gerold, NP and Davonna Belling, LMFT-A .  R:  Pt receptive. ? ?Dellia Nims, M.Ed,CNA ?  ?

## 2021-07-21 ENCOUNTER — Ambulatory Visit (HOSPITAL_COMMUNITY): Payer: BC Managed Care – PPO

## 2021-07-22 ENCOUNTER — Ambulatory Visit (HOSPITAL_COMMUNITY): Payer: BC Managed Care – PPO

## 2021-09-01 ENCOUNTER — Encounter: Payer: Self-pay | Admitting: Allergy

## 2021-09-01 ENCOUNTER — Ambulatory Visit: Payer: BC Managed Care – PPO | Admitting: Allergy

## 2021-09-01 VITALS — BP 122/84 | HR 84 | Temp 97.8°F | Resp 18 | Ht 66.0 in | Wt 278.2 lb

## 2021-09-01 DIAGNOSIS — J3089 Other allergic rhinitis: Secondary | ICD-10-CM

## 2021-09-01 DIAGNOSIS — H1013 Acute atopic conjunctivitis, bilateral: Secondary | ICD-10-CM

## 2021-09-01 DIAGNOSIS — T781XXD Other adverse food reactions, not elsewhere classified, subsequent encounter: Secondary | ICD-10-CM | POA: Diagnosis not present

## 2021-09-01 DIAGNOSIS — L2481 Irritant contact dermatitis due to metals: Secondary | ICD-10-CM | POA: Diagnosis not present

## 2021-09-01 DIAGNOSIS — T50905D Adverse effect of unspecified drugs, medicaments and biological substances, subsequent encounter: Secondary | ICD-10-CM

## 2021-09-01 MED ORDER — RYALTRIS 665-25 MCG/ACT NA SUSP
NASAL | 5 refills | Status: DC
Start: 2021-09-01 — End: 2022-06-12

## 2021-09-01 NOTE — Progress Notes (Signed)
New Patient Note  RE: Rebekah Silva MRN: [id number] DOB: 04/18/1970 Date of Office Visit: 09/01/2021  Primary care provider: Harlan Stains, MD  Chief Complaint: allergies  History of present illness: Rebekah Silva is a 51 y.o. female presenting today for evaluation of possible food allergy and environmental allergy.   She believes that dairy is an issue for her.   She states it will cause terrible gas and bloating that occurs within the hour of ingestion. She also states she has blood in stool when she has dairy as well.  She also has history of ulcerative colitis and is wondering if dairy is a trigger food for this.  A year before pandemic she did a "autoimmune protocol diet" where she limited her diet to see what could drive her inflammation and at that time isolated dairy and egg.  She is avoiding dairy in diet.  Denies cutaneous, respiratory or CV related symptoms with dairy or egg ingestion.    As a kid she reports having an egg allergy.  As a kid with egg she states her skin would get red and bumpy.  This resolved and she was able to tolerate egg in the diet. She does not completely avoid egg in the diet and currently.  Now she does report some gas and bloating but not as bad as dairy.    She also reports getting sinus infections at least 2/year.  Symptoms include congestion, drainage, sore throat, "heavy in the head", ear congestion/pain.  She states the drainage can then turn into green thick gunk at which time she usually will get treated with 1 round of antibiotic.  Has had prednisone at times especially with ear involvement.  Has also had tessalon Perls.    In spring and fall she notes symptoms of congestion, drainage, sneezing, itchy/watery eyes.  She use to take claritin daily but switched Zyrtec and seems to be helpful.  She does not use nose sprays.  Occasional use of eye drop. She can get migraines however feels it weather related.    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 She avoids metals in jewelry, clothing, shoes etc.    PCN - she was told she developed a rash as a child.  She has been avoiding since.  ASA - as a child was told she was allergic but has no idea why.  This causes issues with UC now however thus she avoids.  Codeine - caused headaches as a chid.  Thus she avoids.   Review of systems in the past 4 week: Review of Systems  Constitutional: Negative.   HENT:         See HPI  Eyes:        See HPI  Respiratory: Negative.    Cardiovascular: Negative.   Gastrointestinal: Negative.   Musculoskeletal: Negative.   Skin: Negative.   Allergic/Immunologic: Negative.   Neurological: Negative.     All other systems negative unless noted above in HPI  Past medical history: Past Medical History:  Diagnosis Date   Anxiety    Arthritis    ra   Colitis    Depression    GERD (gastroesophageal reflux disease)    Hypercholesterolemia    DR. WHITE   Migraines    PIH (pregnancy induced hypertension) yrs ago, none since   PONV (postoperative nausea and vomiting)    Wears glasses     Past surgical history: Past Surgical History:  Procedure Laterality Date   ANAL FISSURE REPAIR N/A 04/09/2015   Procedure: ANAL CHEMICAL DENERVATION(BOTOX);  Surgeon: Ralene Ok, MD;  Location: [facility];  Service: General;  Laterality: N/A;   ANAL FISSURE REPAIR  07/06/2016   CATARACT EXTRACTION Left    CESAREAN SECTION  2007   COLONOSCOPY     INTRAUTERINE DEVICE INSERTION  12/15/2005   MOUTH SURGERY     RECTAL EXAM UNDER ANESTHESIA N/A 02/17/2015   Procedure: RECTAL EXAM UNDER ANESTHESIA;  Surgeon: Ralene Ok, MD;  Location: [facility];  Service: General;  Laterality: N/A;   RETINAL DETACHMENT SURGERY  2019   X 5    SPHINCTEROTOMY N/A 02/17/2015   Procedure: LATERAL INTERNAL SPHINCTEROTOMY;  Surgeon: Ralene Ok, MD;  Location: [facility];  Service: General;  Laterality: N/A;    Family history:   Family History  Problem Relation Age of Onset   Anxiety disorder Mother    Depression Mother    Breast cancer Mother        mastectomy- all through the ducts of left breast    Hypertension Father    Breast cancer Maternal Aunt    Cancer Maternal Grandfather        colon?   Diabetes Paternal Grandmother    Heart disease Paternal Grandmother     Social history: Lives in a townhome with carpeting in the bedroom with gas heating and central/fan cooling.  Cats in the home.  No concern for water damage, mildew or roaches in the home.  She is a Pharmacist, hospital.  Denies smoking history.    Medication List: Current Outpatient Medications  Medication Sig Dispense Refill   atorvastatin (LIPITOR) 20 MG tablet Take 20 mg by mouth at bedtime.     buPROPion (WELLBUTRIN XL) 150 MG 24 hr tablet Take 150 mg by mouth every morning.     Calcium Carbonate-Vitamin D 600-400 MG-UNIT tablet Take 1 tablet by mouth at bedtime.     cetirizine (ZYRTEC) 10 MG tablet Take 10 mg by mouth daily.     DULoxetine (CYMBALTA) 60 MG capsule Take 1 capsule (60 mg total) by mouth daily. 30 capsule 0   gabapentin (NEURONTIN) 300 MG capsule Take 1 capsule (300 mg total) by mouth 3 (three) times daily. 90 capsule 0   hydrOXYzine (ATARAX) 25 MG tablet Take 1 tablet (25 mg total) by mouth 3 (three) times daily as needed for anxiety. 30 tablet 0   latanoprost (XALATAN) 0.005 % ophthalmic solution Place 1 drop into both eyes at bedtime.     Multiple Vitamin (MULTIVITAMIN WITH MINERALS) TABS tablet Take 1 tablet by mouth daily.     Olopatadine-Mometasone (RYALTRIS) [id number] MCG/ACT SUSP Place 2 sprays per nostril 1-2 times daily as needed for nasal congestion or drainage. 29 g 5   QUEtiapine (SEROQUEL) 25 MG tablet Take 1 tablet (25 mg total) by mouth 3 (three) times daily. 90 tablet 0   rizatriptan (MAXALT) 10 MG tablet Take 10 mg by mouth as needed for migraine. May repeat in 2 hours if needed     topiramate (TOPAMAX) 50 MG tablet Take  1 tablet (50 mg total) by mouth every 12 (twelve) hours. 60 tablet 0   mesalamine (LIALDA) 1.2 G EC tablet Take 4.8 g by mouth daily.     promethazine (PHENERGAN) 50 MG suppository Place 50 mg rectally every 6 (six) hours as needed for nausea or vomiting. (Patient not taking: Reported on 09/01/2021)     Current Facility-Administered Medications  Medication Dose Route Frequency Provider  Last Rate Last Admin   levonorgestrel (MIRENA) 20 MCG/24HR IUD   Intrauterine Once Terrance Mass, MD        Known medication allergies: Allergies  Allergen Reactions   Aspirin Other (See Comments)    Ulcerative colitis, abdominal pain   Ceftin [Cefuroxime] Other (See Comments)    Diarrhea, abdominal cramping   Codeine Other (See Comments)    Severe headaches   Lactose Other (See Comments)    Dairy - sets off my colitis   Other Rash and Other (See Comments)    Contact metal agents    Penicillins Rash and Other (See Comments)    Has patient had a PCN reaction causing immediate rash, facial/tongue/throat swelling, SOB or lightheadedness with hypotension: unknown Has patient had a PCN reaction causing severe rash involving mucus membranes or skin necrosis: unknown Has patient had a PCN reaction that required hospitalization unknown Has patient had a PCN reaction occurring within the last 10 years: unknown If all of the above answers are "NO", then may proceed with Cephalosporin use.      Physical examination: Blood pressure 122/84, pulse 84, temperature 97.8 F (36.6 C), resp. rate 18, height 5' 6"  (1.676 m), weight 278 lb 4 oz (126.2 kg), SpO2 97 %.  General: Alert, interactive, in no acute distress. HEENT: PERRLA, TMs pearly gray, turbinates non-edematous without discharge, post-pharynx non erythematous. Neck: Supple without lymphadenopathy. Lungs: Clear to auscultation without wheezing, rhonchi or rales. {no increased work of breathing. CV: Normal S1, S2 without murmurs. Abdomen:  Nondistended, nontender. Skin: Warm and dry, without lesions or rashes. Extremities:  No clubbing, cyanosis or edema. Neuro:   Grossly intact.  Diagnositics/Labs:  Allergy testing:   Airborne Adult Perc - 09/01/21 0900     Time Antigen Placed 5409    Allergen Manufacturer Lavella Hammock    Location Back    Number of Test 59    1. Control-Buffer 50% Glycerol Negative    2. Control-Histamine 1 mg/ml 2+    3. Albumin saline Negative    4. [place] Negative    5. [place] Negative    6. Johnson Negative    7. [place] Blue Negative    8. Meadow Fescue Negative    9. Perennial Rye Negative    10. Sweet Vernal Negative    11. Timothy Negative    12. Cocklebur Negative    13. Burweed Marshelder Negative    14. Ragweed, short Negative    15. Ragweed, Giant Negative    16. Plantain,  English Negative    17. Lamb's Quarters Negative    18. Sheep Sorrell Negative    19. Rough Pigweed Negative    20. Marsh Elder, Rough Negative    21. Mugwort, Common Negative    22. Ash mix Negative    23. Birch mix Negative    24. Beech American Negative    25. Box, Elder Negative    26. Cedar, red Negative    27. Cottonwood, [place] Negative    28. Elm mix Negative    29. Hickory Negative    30. Maple mix Negative    31. Oak, [place] mix Negative    32. Pecan Pollen Negative    33. Pine mix Negative    34. Sycamore Eastern Negative    35. Golden Grove, Black Pollen Negative    36. Alternaria alternata Negative    37. Cladosporium Herbarum Negative    38. Aspergillus mix Negative    39. Penicillium mix Negative    40.  Bipolaris sorokiniana (Helminthosporium) Negative    41. Drechslera spicifera (Curvularia) Negative    42. Mucor plumbeus Negative    43. Fusarium moniliforme Negative    44. Aureobasidium pullulans (pullulara) Negative    45. Rhizopus oryzae Negative    46. Botrytis cinera Negative    47. Epicoccum nigrum 2+    48. Phoma betae Negative    49. Candida Albicans Negative    50.  Trichophyton mentagrophytes Negative    51. Mite, D Farinae  5,000 AU/ml Negative    52. Mite, D Pteronyssinus  5,000 AU/ml 2+    53. Cat Hair 10,000 BAU/ml Negative    54.  Dog Epithelia Negative    55. Mixed Feathers Negative    56. Horse Epithelia Negative    57. Cockroach, German Negative    58. Mouse Negative    59. Tobacco Leaf Negative             Food Perc - 09/01/21 1200       Food   5. Milk, cow Negative    6. Egg White, chicken Negative             Intradermal - 09/01/21 1200     Time Antigen Placed 1000    Allergen Manufacturer Greer    Location Back    Number of Test 14    Control Negative    Guatem[place] Negative    Johnson 2+    7 Grass 3+    Ragweed mix Negative    Weed mix 2+    Tree mix 2+    Mold 1 Negative    Mold 2 Negative    Mold 3 Negative    Mold 4 2+    Cat 3+    Dog 2+    Cockroach Negative    Mite mix Omitted             Allergy testing results were read and interpreted by provider, documented by clinical staff.   Assessment and plan:   Adverse food reaction - Skin testing to milk and egg are negative thus not concerned for IgE mediated food allergy but most likely to be intolerant foods.  Avoidance is still best to prevent GI related symptoms - We have discussed the following in regards to foods:   Allergy: food allergy is when you have eaten a food, developed an allergic reaction after eating the food and have IgE to the food (positive food testing either by skin testing or blood testing).  Food allergy could lead to life threatening symptoms  Sensitivity: occurs when you have IgE to a food (positive food testing either by skin testing or blood testing) but is a food you eat without any issues.  This is not an allergy and we recommend keeping the food in the diet  Intolerance: this is when you have negative testing by either skin testing or blood testing thus not allergic but the food causes symptoms (like belly pain, bloating,  diarrhea etc) with ingestion.  These foods should be avoided to prevent symptoms.    Allergic rhinitis with conjunctivitis - Testing today showed: grasses, weeds, trees, outdoor molds, dust mites, cat, and dog. - Copy of test results provided.  - Avoidance measures provided. - Continue with: Zyrtec (cetirizine) 42m tablet once daily as needed.  If this becomes less effective can rotate every 3-6 months between other antihistamines like Allegra, Xyzal or Claritin - Start taking: Ryaltris (olopatadine/mometasone) two sprays per nostril 1-2 times daily as needed  for nasal congestion or drainage.  Pataday (olopatadine) one drop per eye daily as needed for itchy/watery eyes - You can use an extra dose of the antihistamine, if needed, for breakthrough symptoms.  - continue nasal saline rinses when needed to remove allergens from the nasal cavities as well as help with mucous clearance (this is especially helpful to do before the nasal sprays are given) - Consider allergy shots as a means of long-term control. - Allergy shots "re-train" and "reset" the immune system to ignore environmental allergens and decrease the resulting immune response to those allergens (sneezing, itchy watery eyes, runny nose, nasal congestion, etc).    - Allergy shots improve symptoms in 80-85% of patients.  - We can discuss more at a future appointment if the medications are not working for you.  Medication allergy - at this time it is very unlikely you are still allergic/reactive to penicillins.  Recommend performing a graded drug challenge.  You would be given 2-3 doses of penicillin product every 20 minutes with an hour observation.   - continue to avoid penicillin, aspirin and codeine (side effect) products  Contact dermatitis (contact allergy) - continue to avoid metals - if you ever need surgery or other reason to need exposure to metals we can perform metal patch testing. Patch testing is the test of choice to  evaluate for contact dermatitis.  Patches are best placed on a Monday with return to office on Wednesday and Friday of same week for readings.  Once patches are in place to do not get them wet.  You can take antihistamines while patches are in place.   Follow-up in 6 months or sooner if needed (sooner for penicillin challenge)    I appreciate the opportunity to take part in Rebekah Silva's care. Please do not hesitate to contact me with questions.  Sincerely,   Prudy Feeler, MD Allergy/Immunology [facility] [place]

## 2021-09-01 NOTE — Patient Instructions (Signed)
Adverse food reaction - Skin testing to milk and egg are negative thus not concerned for IgE mediated food allergy but most likely to be intolerant foods.  Avoidance is still best to prevent GI related symptoms - We have discussed the following in regards to foods:   Allergy: food allergy is when you have eaten a food, developed an allergic reaction after eating the food and have IgE to the food (positive food testing either by skin testing or blood testing).  Food allergy could lead to life threatening symptoms  Sensitivity: occurs when you have IgE to a food (positive food testing either by skin testing or blood testing) but is a food you eat without any issues.  This is not an allergy and we recommend keeping the food in the diet  Intolerance: this is when you have negative testing by either skin testing or blood testing thus not allergic but the food causes symptoms (like belly pain, bloating, diarrhea etc) with ingestion.  These foods should be avoided to prevent symptoms.    Environmental allergy - Testing today showed: grasses, weeds, trees, outdoor molds, dust mites, cat, and dog. - Copy of test results provided.  - Avoidance measures provided. - Continue with: Zyrtec (cetirizine) 59m tablet once daily as needed.  If this becomes less effective can rotate every 3-6 months between other antihistamines like Allegra, Xyzal or Claritin - Start taking: Ryaltris (olopatadine/mometasone) two sprays per nostril 1-2 times daily as needed for nasal congestion or drainage.  Pataday (olopatadine) one drop per eye daily as needed for itchy/watery eyes - You can use an extra dose of the antihistamine, if needed, for breakthrough symptoms.  - continue nasal saline rinses when needed to remove allergens from the nasal cavities as well as help with mucous clearance (this is especially helpful to do before the nasal sprays are given) - Consider allergy shots as a means of long-term control. - Allergy shots  "re-train" and "reset" the immune system to ignore environmental allergens and decrease the resulting immune response to those allergens (sneezing, itchy watery eyes, runny nose, nasal congestion, etc).    - Allergy shots improve symptoms in 80-85% of patients.  - We can discuss more at a future appointment if the medications are not working for you.  Medication allergy - at this time it is very unlikely you are still allergic/reactive to penicillins.  Recommend performing a graded drug challenge.  You would be given 2-3 doses of penicillin product every 20 minutes with an hour observation.   - continue to avoid penicillin, aspirin and codeine (side effect) products  Contact dermatitis (contact allergy) - continue to avoid metals - if you ever need surgery or other reason to need exposure to metals we can perform metal patch testing. Patch testing is the test of choice to evaluate for contact dermatitis.  Patches are best placed on a Monday with return to office on Wednesday and Friday of same week for readings.  Once patches are in place to do not get them wet.  You can take antihistamines while patches are in place.   Follow-up in 6 months or sooner if needed (sooner for penicillin challenge)

## 2021-09-14 NOTE — Psych (Signed)
Virtual Visit via Video Note  I connected with Rebekah Silva on 06/20/21 at  9:00 AM EDT by a video enabled telemedicine application and verified that I am speaking with the correct person using two identifiers.  Location: Patient: patient's home Provider: clinical home office   I discussed the limitations of evaluation and management by telemedicine and the availability of in person appointments. The patient expressed understanding and agreed to proceed.   I discussed the assessment and treatment plan with the patient. The patient was provided an opportunity to ask questions and all were answered. The patient agreed with the plan and demonstrated an understanding of the instructions.   The patient was advised to call back or seek an in-person evaluation if the symptoms worsen or if the condition fails to improve as anticipated.  Pt was provided 240 minutes of non-face-to-face time during this encounter.   Lorin Glass, LCSW   [facility] PHP THERAPIST PROGRESS NOTE  Rebekah Silva [id number]   Session Time: 9:00 am - 10:00 am  Participation Level: Active  Behavioral Response: CasualAlertAnxious and Depressed  Type of Therapy: Group Therapy  Treatment Goals addressed: Coping  Progress Towards Goals: Progressing  Interventions: CBT, DBT, Solution Focused, Strength-based, Supportive, and Reframing  Therapist Response: Clinician led check-in regarding current stressors and situation, and review of patient completed daily inventory. Clinician utilized active listening and empathetic response and validated patient emotions. Clinician facilitated processing group on pertinent issues.?   Summary: Rebekah Silva is a 51yo female who presents with anxiety and depression symptoms. Patient arrived within time allowed. Patient rates her mood at a 8 on a scale of 1-10 with 10 being best. Pt reports her weekend was "low key" and her mood was "pretty good." Pt states she had an issue  with her spouse that she ruminated on and struggled to move past. Pt reports feeling "out of sync" due to the holiday weekend being different than normal. Pt struggles with managing big feelings. Pt able to process.?Pt engaged in discussion.?      Session Time: 10:00 am - 11:00 am  Participation Level: Active  Behavioral Response: CasualAlertAnxious and Depressed  Type of Therapy: Group Therapy  Treatment Goals addressed: Coping  Progress Towards Goals: Progressing  Interventions: CBT, DBT, Solution Focused, Strength-based, Supportive, and Reframing  Therapist Response: Cln led processing group for pt's current struggles. Group members shared stressors and provided support and feedback. Cln brought in topics of boundaries, healthy relationships, and unhealthy thought processes to inform discussion.    Therapist Response: Pt able to process and provide support to group.        Session Time: 11:00 -12:00   Participation Level: Active   Behavioral Response: CasualAlertDepressed   Type of Therapy: Group Therapy, Occupational Therapy   Treatment Goals addressed: Coping  Progress Towards Goals: Progressing   Interventions: Supportive, Education   Summary:  Occupational Therapy group   Therapist Response: See OT note.       Session Time: 12:00 -1:00   Participation Level: Active   Behavioral Response: CasualAlertDepressed   Type of Therapy: Group therapy   Treatment Goals addressed: Coping  Progress Towards Goals: Progressing   Interventions: CBT; Solution focused; Supportive; Reframing   Summary: 12:00 - 12:50: Cln introduced DBT concept of radical acceptance. Cln discussed radical acceptance as a strategy to decrease distress and to manage situations outside of their control. Group volunteered struggles they are experiencing with control and cln helped group apply radical acceptance.  12:50 -1:00 Clinician  led check-out. Clinician assessed for immediate needs,  medication compliance and efficacy, and safety concerns   Therapist Response: 12:00 - 12:50: Pt engaged in discussion and identified ways they can apply radical acceptance in their life.  12:50 - 1:00 pm: At check-out, patient rates her mood at a 6.5 on a scale of 1-10 with 10 being great. Pt reports afternoon plans of seeing a friend. Patient demonstrates progress as evidenced by self-managing mood swings over the weekend. Patient denies SI/HI/self-harm thoughts at the end of group.  Suicidal/Homicidal: Nowithout intent/plan  Plan: ?Pt will continue in PHP and medication management while continuing to work on decreasing depression symptoms,?SI, and anxiety symptoms,?and increasing the ability to self manage symptoms.    Collaboration of Care: Medication Management AEB Ricky Ala, NP  Patient/Guardian was advised Release of Information must be obtained prior to any record release in order to collaborate their care with an outside provider. Patient/Guardian was advised if they have not already done so to contact the registration department to sign all necessary forms in order for [place] to release information regarding their care.   Consent: Patient/Guardian gives verbal consent for treatment and assignment of benefits for services provided during this visit. Patient/Guardian expressed understanding and agreed to proceed.   Diagnosis: MDD (major depressive disorder), recurrent severe, without psychosis ([place]) [F33.2]    1. MDD (major depressive disorder), recurrent severe, without psychosis ([place])   2. Generalized anxiety disorder       Lorin Glass, LCSW

## 2021-09-27 NOTE — Psych (Signed)
Virtual Visit via Video Note  I connected with Rebekah Silva on 06/22/21 at  9:00 AM EDT by a video enabled telemedicine application and verified that I am speaking with the correct person using two identifiers.  Location: Patient: patient's home Provider: clinical home office   I discussed the limitations of evaluation and management by telemedicine and the availability of in person appointments. The patient expressed understanding and agreed to proceed.   I discussed the assessment and treatment plan with the patient. The patient was provided an opportunity to ask questions and all were answered. The patient agreed with the plan and demonstrated an understanding of the instructions.   The patient was advised to call back or seek an in-person evaluation if the symptoms worsen or if the condition fails to improve as anticipated.  Pt was provided 240 minutes of non-face-to-face time during this encounter.   Lorin Glass, LCSW   [facility] PHP THERAPIST PROGRESS NOTE  Rebekah Silva [id number]   Session Time: 9:00 am - 10:00 am  Participation Level: Active  Behavioral Response: CasualAlertAnxious and Depressed  Type of Therapy: Group Therapy  Treatment Goals addressed: Coping  Progress Towards Goals: Progressing  Interventions: CBT, DBT, Solution Focused, Strength-based, Supportive, and Reframing  Therapist Response: Clinician led check-in regarding current stressors and situation, and review of patient completed daily inventory. Clinician utilized active listening and empathetic response and validated patient emotions. Clinician facilitated processing group on pertinent issues.?   Summary: Rebekah Silva is a 51yo female who presents with anxiety and depression symptoms. Patient arrived within time allowed. Patient rates her mood at a 7 on a scale of 1-10 with 10 being best. Pt states she feels "kind of anxious." Pt reports struggling with a migraine yesterday. Pt states  she spent time with her sister and it was good for her heart. Pt reports struggles with difficulties between her husband and daughter. Pt able to process.?Pt engaged in discussion.?      Session Time: 10:00 am - 11:00 am  Participation Level: Active  Behavioral Response: CasualAlertAnxious and Depressed  Type of Therapy: Group Therapy  Treatment Goals addressed: Coping  Progress Towards Goals: Progressing  Interventions: CBT, DBT, Solution Focused, Strength-based, Supportive, and Reframing  Therapist Response: Cln led processing group for pt's current struggles. Group members shared stressors and provided support and feedback. Cln brought in topics of boundaries, healthy relationships, and unhealthy thought processes to inform discussion.    Therapist Response: Pt able to process and provide support to group.       Session Time: 11:00 -12:00   Participation Level: Active   Behavioral Response: CasualAlertDepressed   Type of Therapy: Group Therapy, Spiritual Care   Treatment Goals addressed: Coping  Progress Towards Goals: Progressing   Interventions: Supportive, Education   Summary:  Rebekah Silva, Chaplain, led group.   Therapist Response: Pt participated        Session Time: 12:00 -1:00   Participation Level: Active   Behavioral Response: CasualAlertDepressed   Type of Therapy: Group Therapy, OT   Treatment Goals addressed: Coping  Progress Towards Goals: Progressing   Interventions: Supportive, Education   Summary:  OT, Cornell Barman, led group. 12:50 -1:00 Clinician led check-out. Clinician assessed for immediate needs, medication compliance and efficacy, and safety concerns   Therapist Response: 12:00 - 12:50: Pt engaged in activity. 12:50 - 1:00 pm: At check-out, patient rates her mood at a 6.5 on a scale of 1-10 with 10 being great. Pt reports afternoon plans of  resting for her migraine. Patient demonstrates progress as evidenced by seeking self  care activities. Patient denies SI/HI/self-harm thoughts at the end of group.  Suicidal/Homicidal: Nowithout intent/plan  Plan: ?Pt will continue in PHP and medication management while continuing to work on decreasing depression symptoms,?SI, and anxiety symptoms,?and increasing the ability to self manage symptoms.    Collaboration of Care: Medication Management AEB Ricky Ala, NP  Patient/Guardian was advised Release of Information must be obtained prior to any record release in order to collaborate their care with an outside provider. Patient/Guardian was advised if they have not already done so to contact the registration department to sign all necessary forms in order for [place] to release information regarding their care.   Consent: Patient/Guardian gives verbal consent for treatment and assignment of benefits for services provided during this visit. Patient/Guardian expressed understanding and agreed to proceed.   Diagnosis: MDD (major depressive disorder), recurrent severe, without psychosis ([place]) [F33.2]    1. MDD (major depressive disorder), recurrent severe, without psychosis ([place])   2. Generalized anxiety disorder       Lorin Glass, LCSW

## 2021-10-19 NOTE — Psych (Signed)
Virtual Visit via Video Note  I connected with Tracie Harrier Grieves on 06/30/21 at  9:00 AM EDT by a video enabled telemedicine application and verified that I am speaking with the correct person using two identifiers.  Location: Patient: home Provider: clinical home office   I discussed the limitations of evaluation and management by telemedicine and the availability of in person appointments. The patient expressed understanding and agreed to proceed.  I discussed the assessment and treatment plan with the patient. The patient was provided an opportunity to ask questions and all were answered. The patient agreed with the plan and demonstrated an understanding of the instructions.   The patient was advised to call back or seek an in-person evaluation if the symptoms worsen or if the condition fails to improve as anticipated.  Pt was provided 240 minutes of non-face-to-face time during this encounter.   Lorin Glass, LCSW   [facility] PHP THERAPIST PROGRESS NOTE  Jeneane Pieczynski [id number]  Session Time: 9:00 -10:00  Participation Level: Active  Behavioral Response: CasualAlertAnxious and Depressed  Type of Therapy: Group Therapy  Treatment Goals addressed: Coping  Progress Towards Goals: Progressing  Interventions: CBT, DBT, Solution Focused, Strength-based, Supportive, and Reframing  Summary: Clinician led check-in regarding current stressors and situation. Clinician utilized active listening and empathetic response and validated patient emotions. Clinician facilitated processing group on pertinent issues.  Therapist Response: Parthenia Tellefsen Scull is a 51 y.o. female who presents with depression and anxiety symptoms. Patient arrived within time allowed and reports that she is feeling "okay." Patient rates her mood at a 7 on a scale of 1-10 with 10 being great. Pt reports having a "busy" mind today and feeling more affected by negativity than normal. Pt reports issues with social  media, traffic, and her daughter. Pt reports using PRN meds to address her racing thoughts. Patient able to process. Patient engaged in discussion.      Session Time: 10:00- 11:00   Participation Level: Active   Behavioral Response: CasualAlertDepressed   Type of Therapy: Group Therapy   Treatment Goals addressed: Coping  Progress Towards Goals: Progressing   Interventions: CBT, DBT, Supportive, Reframing, and Strengths Based   Summary: Cln introduced "I" statements and the ways in which they can improve assertiveness and overall communication. Cln educated group on "I" statement formula and led practice.    Therapist Response: Pt engaged in discussion and demonstrates understanding of how to utilize "I" statements.         Session Time: 11:00 -12:00   Participation Level: Active   Behavioral Response: CasualAlertDepressed   Type of Therapy: Group Therapy, Occupational Therapy   Treatment Goals addressed: Coping   Progress Towards Goals: Progressing   Interventions: Supportive, Education   Summary:  Occupational Therapy group   Therapist Response: See OT note.        Session Time: 12:00 -1:00   Participation Level: Active   Behavioral Response: CasualAlertDepressed   Type of Therapy: Group therapy   Treatment Goals addressed: Coping   Progress Towards Goals: Progressing   Interventions: CBT; Solution focused; Supportive; Reframing   Summary: 12:00 - 12:50: Cln introduced topic of DBT distress tolerance skills. Cln provided context for distress tolerance skills and how to practice them. Cln introduced the ACCEPTS distraction skills and group discusses ways to utilize "A" activities.  12:50 - 1:00: At check-out, patient reports no immediate concerns. Patient demonstrates some progress as evidenced by engaging with creativity. Patient denies SI/HI/self-harm at the end of group.  Suicidal/Homicidal: Nowithout intent/plan  Plan: Pt will continue in PHP  and medication management while continuing to work on decreasing depression symptoms, SI, and increasing the ability to self manage symptoms.  Collaboration of Care: None  Patient/Guardian was advised Release of Information must be obtained prior to any record release in order to collaborate their care with an outside provider. Patient/Guardian was advised if they have not already done so to contact the registration department to sign all necessary forms in order for [place] to release information regarding their care.   Consent: Patient/Guardian gives verbal consent for treatment and assignment of benefits for services provided during this visit. Patient/Guardian expressed understanding and agreed to proceed.   Diagnosis: MDD (major depressive disorder), recurrent severe, without psychosis ([place]) [F33.2]    1. MDD (major depressive disorder), recurrent severe, without psychosis ([place])   2. Generalized anxiety disorder       Lorin Glass, LCSW

## 2021-10-19 NOTE — Psych (Signed)
Virtual Visit via Video Note  I connected with [name] on 07/01/21 at  9:00 AM EDT by a video enabled telemedicine application and verified that I am speaking with the correct person using two identifiers.  Location: Patient: home Provider: clinical home office   I discussed the limitations of evaluation and management by telemedicine and the availability of in person appointments. The patient expressed understanding and agreed to proceed.  I discussed the assessment and treatment plan with the patient. The patient was provided an opportunity to ask questions and all were answered. The patient agreed with the plan and demonstrated an understanding of the instructions.   The patient was advised to call back or seek an in-person evaluation if the symptoms worsen or if the condition fails to improve as anticipated.  Pt was provided 240 minutes of non-face-to-face time during this encounter.   Lorin Glass, LCSW   [facility] PHP THERAPIST PROGRESS NOTE  Rebekah Silva [id number]  Session Time: 9:00 -10:00  Participation Level: Active  Behavioral Response: CasualAlertAnxious and Depressed  Type of Therapy: Group Therapy  Treatment Goals addressed: Coping  Progress Towards Goals: Progressing  Interventions: CBT, DBT, Solution Focused, Strength-based, Supportive, and Reframing  Summary: Clinician led check-in regarding current stressors and situation. Clinician utilized active listening and empathetic response and validated patient emotions. Clinician facilitated processing group on pertinent issues.  Therapist Response: Rebekah Posner Silva is a 51 y.o. female who presents with depression and anxiety symptoms. Patient arrived within time allowed and reports that she is feeling "okay." Patient rates her mood at a 7 on a scale of 1-10 with 10 being great. Pt reports she is "on the struggle bus" and feels scattered due to having a cold. Pt reports her brain feels foggy.   Patient able to process. Patient engaged in discussion.      Session Time: 10:00- 11:00   Participation Level: Active   Behavioral Response: CasualAlertDepressed   Type of Therapy: Group Therapy   Treatment Goals addressed: Coping  Progress Towards Goals: Progressing   Interventions: CBT, DBT, Supportive, Reframing, and Strengths Based   Summary: Cln continued topic of DBT distress tolerance skills and the ACCEPTS distraction skill. Group reviewed C-C-E skills and discussed how they can practice them in their every day life.    Therapist Response: Pt engaged in discussion and determines ways to practice each skill.          Session Time: 11:00 -12:00   Participation Level: Active   Behavioral Response: CasualAlertDepressed   Type of Therapy: Group Therapy, Occupational Therapy   Treatment Goals addressed: Coping   Progress Towards Goals: Progressing   Interventions: Supportive, Education   Summary:  Occupational Therapy group   Therapist Response: See OT note.        Session Time: 12:00 -1:00   Participation Level: Active   Behavioral Response: CasualAlertDepressed   Type of Therapy: Group therapy   Treatment Goals addressed: Coping   Progress Towards Goals: Progressing   Interventions: CBT; Solution focused; Supportive; Reframing   Summary: 12:00 - 12:50: Cln continued topic of DBT distress tolerance skills and the ACCEPTS distraction skill. Group reviewed P-T-S skills and discussed how they can practice them in their every day life.  12:50 -1:00 Clinician led check-out. Clinician assessed for immediate needs, medication compliance and efficacy, and safety concerns   Therapist Response: 12:00 - 12:50: Pt engaged in discussion and determines ways to practice each skill.  12:50 - 1:00: At check-out, patient reports no  immediate concerns. Patient demonstrates some progress as evidenced by increased mood stability and management. Patient denies  SI/HI/self-harm at the end of group.    Suicidal/Homicidal: Nowithout intent/plan  Plan: Pt will discharge from PHP due to meeting treatment goals of decreased depression symptoms, SI, and increased ability to self manage symptoms. Pt will step-down to IOP within this agency beginning 4/25. Pt and provider are aligned with discharge plan. Pt denies SI/HI at time of discharge.  Collaboration of Care: None  Patient/Guardian was advised Release of Information must be obtained prior to any record release in order to collaborate their care with an outside provider. Patient/Guardian was advised if they have not already done so to contact the registration department to sign all necessary forms in order for Rebekah Silva to release information regarding their care.   Consent: Patient/Guardian gives verbal consent for treatment and assignment of benefits for services provided during this visit. Patient/Guardian expressed understanding and agreed to proceed.   Diagnosis: MDD (major depressive disorder), recurrent severe, without psychosis ([place]) [F33.2]    1. MDD (major depressive disorder), recurrent severe, without psychosis ([place])   2. Difficulty coping   3. Generalized anxiety disorder       Lorin Glass, LCSW

## 2021-10-19 NOTE — Psych (Signed)
Virtual Visit via Video Note  I connected with Tracie Harrier Hersch on 06/29/21 at  9:00 AM EDT by a video enabled telemedicine application and verified that I am speaking with the correct person using two identifiers.  Location: Patient: home Provider: clinical home office   I discussed the limitations of evaluation and management by telemedicine and the availability of in person appointments. The patient expressed understanding and agreed to proceed.  I discussed the assessment and treatment plan with the patient. The patient was provided an opportunity to ask questions and all were answered. The patient agreed with the plan and demonstrated an understanding of the instructions.   The patient was advised to call back or seek an in-person evaluation if the symptoms worsen or if the condition fails to improve as anticipated.  Pt was provided 240 minutes of non-face-to-face time during this encounter.   Lorin Glass, LCSW   [facility] PHP THERAPIST PROGRESS NOTE  Keimya Briddell [id number]  Session Time: 9:00 -10:00  Participation Level: Active  Behavioral Response: CasualAlertAnxious and Depressed  Type of Therapy: Group Therapy  Treatment Goals addressed: Coping  Progress Towards Goals: Progressing  Interventions: CBT, DBT, Solution Focused, Strength-based, Supportive, and Reframing  Summary: Clinician led check-in regarding current stressors and situation. Clinician utilized active listening and empathetic response and validated patient emotions. Clinician facilitated processing group on pertinent issues.  Therapist Response: Aanvi Voyles Conkey is a 51 y.o. female who presents with depression and anxiety symptoms. Patient arrived within time allowed and reports that she is feeling "not great." Patient rates her mood at a 5 on a scale of 1-10 with 10 being great. Pt reports having an appt yesterday that didn't go well and "drudged up stuff." Pt reports panic re: a financial  issue and struggling to manage. Pt reports all of this impacted her sleep negatively and she has been up since 3am. Patient able to process. Patient engaged in discussion.      Session Time: 10:00- 11:00   Participation Level: Active   Behavioral Response: CasualAlertDepressed   Type of Therapy: Group Therapy   Treatment Goals addressed: Coping  Progress Towards Goals: Progressing   Interventions: CBT, DBT, Supportive, Reframing, and Strengths Based   Summary: Cln led discussion on change. Group members shared ways in which they struggle with change and what makes it difficult. Cln brought in DBT radical acceptance and CBT thought challenging as a way to manage issues with change.    Therapist Response: Pt engaged in discussion.       Session Time: 11:00 -12:00   Participation Level: Active   Behavioral Response: CasualAlertDepressed   Type of Therapy: Group Therapy, Spiritual Care   Treatment Goals addressed: Coping  Progress Towards Goals: Progressing   Interventions: Supportive, Education   Summary:  Alain Marion, Chaplain, led group.   Therapist Response: Pt participated       Session Time: 12:00 -1:00   Participation Level: Active   Behavioral Response: CasualAlertDepressed   Type of Therapy: Group Therapy, OT   Treatment Goals addressed: Coping  Progress Towards Goals: Progressing   Interventions: Supportive, Education   Summary:  OT, Cornell Barman, led group. 12:50 -1:00 Clinician led check-out. Clinician assessed for immediate needs, medication compliance and efficacy, and safety concerns   Therapist Response: 12:00 - 12:50: See OT note  12:50 - 1:00: At check-out, patient reports no immediate concerns. Patient demonstrates some progress as evidenced by attempting to add positive events. Patient denies SI/HI/self-harm at the end of  group.    Suicidal/Homicidal: Nowithout intent/plan  Plan: Pt will continue in PHP and medication management  while continuing to work on decreasing depression symptoms, SI, and increasing the ability to self manage symptoms.  Collaboration of Care: None  Patient/Guardian was advised Release of Information must be obtained prior to any record release in order to collaborate their care with an outside provider. Patient/Guardian was advised if they have not already done so to contact the registration department to sign all necessary forms in order for [place] to release information regarding their care.   Consent: Patient/Guardian gives verbal consent for treatment and assignment of benefits for services provided during this visit. Patient/Guardian expressed understanding and agreed to proceed.   Diagnosis: MDD (major depressive disorder), recurrent severe, without psychosis ([place]) [F33.2]    1. MDD (major depressive disorder), recurrent severe, without psychosis ([place])   2. Generalized anxiety disorder       Lorin Glass, LCSW

## 2021-10-19 NOTE — Psych (Signed)
Virtual Visit via Video Note  I connected with Rebekah Silva on 06/23/21 at  9:00 AM EDT by a video enabled telemedicine application and verified that I am speaking with the correct person using two identifiers.  Location: Patient: patient's home Provider: clinical home office   I discussed the limitations of evaluation and management by telemedicine and the availability of in person appointments. The patient expressed understanding and agreed to proceed.   I discussed the assessment and treatment plan with the patient. The patient was provided an opportunity to ask questions and all were answered. The patient agreed with the plan and demonstrated an understanding of the instructions.   The patient was advised to call back or seek an in-person evaluation if the symptoms worsen or if the condition fails to improve as anticipated.  Pt was provided 240 minutes of non-face-to-face time during this encounter.   Lorin Glass, LCSW   [facility] PHP THERAPIST PROGRESS NOTE  Rebekah Silva [id number]   Session Time: 9:00 am - 10:00 am  Participation Level: Active  Behavioral Response: CasualAlertAnxious and Depressed  Type of Therapy: Group Therapy  Treatment Goals addressed: Coping  Progress Towards Goals: Progressing  Interventions: CBT, DBT, Solution Focused, Strength-based, Supportive, and Reframing  Therapist Response: Clinician led check-in regarding current stressors and situation, and review of patient completed daily inventory. Clinician utilized active listening and empathetic response and validated patient emotions. Clinician facilitated processing group on pertinent issues.?   Summary: Rebekah Silva is a 51yo female who presents with anxiety and depression symptoms. Patient arrived within time allowed. Patient rates her mood at a 6.5 on a scale of 1-10 with 10 being best. Pt states she feels "pretty good." Pt reports waking up with "crazy energy" and completed  chores before group this morning. Pt denies symptoms of mania. Pt states struggles with partner last night that she is still processing. Pt continues to struggle with mood dependent thinking. Pt able to process.?Pt engaged in discussion.?      Session Time: 10:00 am - 11:00 am  Participation Level: Active  Behavioral Response: CasualAlertAnxious and Depressed  Type of Therapy: Group Therapy  Treatment Goals addressed: Coping  Progress Towards Goals: Progressing  Interventions: CBT, DBT, Solution Focused, Strength-based, Supportive, and Reframing  Therapist Response: Cln led discussion on HALT (hungry, angry, lonely, tired) acronym, which encourages check-in with ourselves when feeling emotional. Cln encouraged pt's to use HALT as a first check-in step to address basic vulnerabilities before reacting. Group members discussed ways in which they react to being angry, lonely, and tired, and work to determine times in which utilizing HALT would have diverted a bigger issue.    Therapist Response: Pt engaged in discussion and reports understanding of HALT and willingness to utilize it.         Session Time: 11:00 -12:00   Participation Level: Active   Behavioral Response: CasualAlertDepressed   Type of Therapy: Group Therapy, Occupational Therapy   Treatment Goals addressed: Coping   Progress Towards Goals: Progressing   Interventions: Supportive, Education   Summary:  Occupational Therapy group   Therapist Response: See OT note.        Session Time: 12:00 -1:00   Participation Level: Active   Behavioral Response: CasualAlertDepressed   Type of Therapy: Group therapy   Treatment Goals addressed: Coping   Progress Towards Goals: Progressing   Interventions: CBT; Solution focused; Supportive; Reframing   Summary: 12:00 - 12:50: Cln continued topic of boundaries. Cln discussed the different ways  boundaries present: physical, emotional, intellectual, sexual,  material, and time. Group talked about the ways in which each type presents for them and is a struggle.  12:50 -1:00 Clinician led check-out. Clinician assessed for immediate needs, medication compliance and efficacy, and safety concerns   Therapist Response: 12:00 - 12:50: Pt engaged in discussion and reports most issue with emotional boundaries.  12:50 - 1:00 pm: At check-out, patient reports no immediate concerns. Patient demonstrates progress as evidenced by attempting coping skills in the evening. Patient denies SI/HI/self-harm thoughts at the end of group.  Suicidal/Homicidal: Nowithout intent/plan  Plan: ?Pt will continue in PHP and medication management while continuing to work on decreasing depression symptoms,?SI, and anxiety symptoms,?and increasing the ability to self manage symptoms.    Collaboration of Care: Medication Management AEB Ricky Ala, NP  Patient/Guardian was advised Release of Information must be obtained prior to any record release in order to collaborate their care with an outside provider. Patient/Guardian was advised if they have not already done so to contact the registration department to sign all necessary forms in order for [place] to release information regarding their care.   Consent: Patient/Guardian gives verbal consent for treatment and assignment of benefits for services provided during this visit. Patient/Guardian expressed understanding and agreed to proceed.   Diagnosis: MDD (major depressive disorder), recurrent severe, without psychosis ([place]) [F33.2]    1. MDD (major depressive disorder), recurrent severe, without psychosis ([place])   2. Generalized anxiety disorder       Lorin Glass, LCSW

## 2022-05-23 ENCOUNTER — Other Ambulatory Visit: Payer: Self-pay | Admitting: Obstetrics & Gynecology

## 2022-05-23 DIAGNOSIS — Z1231 Encounter for screening mammogram for malignant neoplasm of breast: Secondary | ICD-10-CM

## 2022-06-12 ENCOUNTER — Ambulatory Visit: Payer: BC Managed Care – PPO | Admitting: Nurse Practitioner

## 2022-06-12 ENCOUNTER — Encounter: Payer: Self-pay | Admitting: Nurse Practitioner

## 2022-06-12 VITALS — BP 124/72 | HR 68 | Wt 283.0 lb

## 2022-06-12 DIAGNOSIS — Z01419 Encounter for gynecological examination (general) (routine) without abnormal findings: Secondary | ICD-10-CM | POA: Diagnosis not present

## 2022-06-12 DIAGNOSIS — N912 Amenorrhea, unspecified: Secondary | ICD-10-CM

## 2022-06-12 DIAGNOSIS — N898 Other specified noninflammatory disorders of vagina: Secondary | ICD-10-CM

## 2022-06-12 DIAGNOSIS — N760 Acute vaginitis: Secondary | ICD-10-CM

## 2022-06-12 DIAGNOSIS — B3731 Acute candidiasis of vulva and vagina: Secondary | ICD-10-CM | POA: Diagnosis not present

## 2022-06-12 DIAGNOSIS — Z30431 Encounter for routine checking of intrauterine contraceptive device: Secondary | ICD-10-CM

## 2022-06-12 LAB — ESTRADIOL: Estradiol: 40 pg/mL

## 2022-06-12 LAB — FOLLICLE STIMULATING HORMONE: FSH: 13.4 m[IU]/mL

## 2022-06-12 LAB — WET PREP FOR TRICH, YEAST, CLUE

## 2022-06-12 MED ORDER — METRONIDAZOLE 500 MG PO TABS: 500.0000 mg | ORAL_TABLET | Freq: Two times a day (BID) | ORAL | 0 refills | Status: DC

## 2022-06-12 MED ORDER — FLUCONAZOLE 150 MG PO TABS: 150.0000 mg | ORAL_TABLET | ORAL | 0 refills | Status: DC

## 2022-06-12 NOTE — Progress Notes (Signed)
Rebekah Silva August 28, 1970 [id number]   History:  52 y.o. G1P1001 presents for annual exam. Mirena IUD 01/2016, amenorrheic. She is asking about next steps when it comes time for removal. COCs caused headaches in the past. Denies menopausal symptoms. Normal pap history.   Gynecologic History No LMP recorded. (Menstrual status: IUD).   Contraception/Family planning: IUD Sexually active: Yes  Health Maintenance Last Pap: 04/22/2020. Results were: Normal, 3-year repeat Last mammogram: 10/19/2020. Results were: Normal. Scheduled 07/05/2022 Last colonoscopy: 2021. Results were: every 2 years d/t colitis Last Dexa: 06/15/2021. Results were: T-score-1.2, FRAX 6.7% / 0.5%  Past medical history, past surgical history, family history and social history were all reviewed and documented in the EPIC chart. Married. 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 15 yo daughter. Mother with history of breast cancer in her 7s, maternal aunt in her 44s.   ROS:  A ROS was performed and pertinent positives and negatives are included.  Exam:  Vitals:   06/12/22 1518  BP: 124/72  Pulse: 68  SpO2: 100%  Weight: 283 lb (128.4 kg)   Body mass index is 45.68 kg/m.  General appearance:  Normal Thyroid:  Symmetrical, normal in size, without palpable masses or nodularity. Respiratory  Auscultation:  Clear without wheezing or rhonchi Cardiovascular  Auscultation:  Regular rate, without rubs, murmurs or gallops  Edema/varicosities:  Not grossly evident Abdominal  Soft,nontender, without masses, guarding or rebound.  Liver/spleen:  No organomegaly noted  Hernia:  None appreciated  Skin  Inspection:  Grossly normal Breasts: Examined lying and sitting.   Right: Without masses, retractions, nipple discharge or axillary adenopathy.   Left: Without masses, retractions, nipple discharge or axillary adenopathy. Genitourinary   Inguinal/mons:  Normal without inguinal adenopathy  External genitalia:  Normal appearing vulva  with no masses, tenderness, or lesions  BUS/Urethra/Skene's glands:  Normal  Vagina:  Normal appearing with normal color, no lesions. Thick discharge present  Cervix:  Normal appearing without discharge or lesions  Uterus:  Normal in size, shape and contour.  Midline and mobile, nontender. IUD strings felt  Adnexa/parametria:     Rt: Normal in size, without masses or tenderness.   Lt: Normal in size, without masses or tenderness.  Anus and perineum: Normal  Digital rectal exam: Deferred  Patient informed chaperone available to be present for breast and pelvic exam. Patient has requested no chaperone to be present. Patient has been advised what will be completed during breast and pelvic exam.   Wet prep + yeast, + clue cells (+ odor)  Assessment/Plan:  52 y.o. G1P1001 for annual exam.   Well female exam with routine gynecological exam - Education provided on SBEs, importance of preventative screenings, current guidelines, high calcium diet, regular exercise, and multivitamin daily. Labs with PCP.   Encounter for routine checking of intrauterine contraceptive device (IUD) - Mirena inserted 01/2016, amenorrheic. She is aware of 8-year FDA approval. She is asking about next steps. Will check hormone levels today. Option to exchange or switch to different method. Not due until next November.   Amenorrhea - Plan: Estradiol, Follicle stimulating hormone  Vaginal discharge - Plan: WET PREP FOR [place], YEAST, CLUE. Wet prep + yeast and clue cells.   Vaginal candidiasis - Plan: fluconazole (DIFLUCAN) 150 MG tablet today and repeat in 3 days for total of 3 doses.   Bacterial vaginosis - Plan: metroNIDAZOLE (FLAGYL) 500 MG tablet BID x  7 days.   Screening for cervical cancer - Normal Pap history.  Will repeat at 3-year interval  per guidelines.  Screening for breast cancer - Normal mammogram history.  Mammogram scheduled this month. Normal breast exam today. Mother and maternal aunt with history of  breast cancer.   Screening for colon cancer - 2021 colonoscopy. She gets colonoscopies every 2 years d/t colitis.   Return in 1 year for annual.     Tamela Gammon DNP, 4:16 PM 06/12/2022

## 2022-07-05 ENCOUNTER — Ambulatory Visit
Admission: RE | Admit: 2022-07-05 | Discharge: 2022-07-05 | Disposition: A | Discharge status: Home / Self Care | Payer: BC Managed Care – PPO | Source: Ambulatory Visit | Attending: Obstetrics & Gynecology | Admitting: Obstetrics & Gynecology

## 2022-07-05 DIAGNOSIS — Z1231 Encounter for screening mammogram for malignant neoplasm of breast: Secondary | ICD-10-CM

## 2022-07-16 ENCOUNTER — Emergency Department (HOSPITAL_BASED_OUTPATIENT_CLINIC_OR_DEPARTMENT_OTHER): Payer: BC Managed Care – PPO

## 2022-07-16 ENCOUNTER — Encounter (HOSPITAL_BASED_OUTPATIENT_CLINIC_OR_DEPARTMENT_OTHER): Payer: Self-pay | Admitting: Emergency Medicine

## 2022-07-16 ENCOUNTER — Other Ambulatory Visit: Payer: Self-pay

## 2022-07-16 ENCOUNTER — Inpatient Hospital Stay (HOSPITAL_BASED_OUTPATIENT_CLINIC_OR_DEPARTMENT_OTHER)
Admission: EM | Admit: 2022-07-16 | Discharge: 2022-07-27 | DRG: 872 | Disposition: A | Discharge status: Home Health | Payer: BC Managed Care – PPO | Attending: Internal Medicine | Admitting: Internal Medicine

## 2022-07-16 DIAGNOSIS — Z886 Allergy status to analgesic agent status: Secondary | ICD-10-CM

## 2022-07-16 DIAGNOSIS — D509 Iron deficiency anemia, unspecified: Secondary | ICD-10-CM | POA: Diagnosis present

## 2022-07-16 DIAGNOSIS — G629 Polyneuropathy, unspecified: Secondary | ICD-10-CM | POA: Diagnosis present

## 2022-07-16 DIAGNOSIS — F411 Generalized anxiety disorder: Secondary | ICD-10-CM | POA: Diagnosis present

## 2022-07-16 DIAGNOSIS — Z880 Allergy status to penicillin: Secondary | ICD-10-CM

## 2022-07-16 DIAGNOSIS — K5190 Ulcerative colitis, unspecified, without complications: Secondary | ICD-10-CM | POA: Diagnosis present

## 2022-07-16 DIAGNOSIS — F32A Depression, unspecified: Secondary | ICD-10-CM | POA: Diagnosis present

## 2022-07-16 DIAGNOSIS — Z79899 Other long term (current) drug therapy: Secondary | ICD-10-CM

## 2022-07-16 DIAGNOSIS — L0211 Cutaneous abscess of neck: Secondary | ICD-10-CM | POA: Diagnosis present

## 2022-07-16 DIAGNOSIS — L03211 Cellulitis of face: Secondary | ICD-10-CM | POA: Diagnosis present

## 2022-07-16 DIAGNOSIS — E7800 Pure hypercholesterolemia, unspecified: Secondary | ICD-10-CM | POA: Diagnosis present

## 2022-07-16 DIAGNOSIS — G43909 Migraine, unspecified, not intractable, without status migrainosus: Secondary | ICD-10-CM | POA: Diagnosis present

## 2022-07-16 DIAGNOSIS — M329 Systemic lupus erythematosus, unspecified: Secondary | ICD-10-CM | POA: Diagnosis present

## 2022-07-16 DIAGNOSIS — A419 Sepsis, unspecified organism: Principal | ICD-10-CM | POA: Insufficient documentation

## 2022-07-16 DIAGNOSIS — E871 Hypo-osmolality and hyponatremia: Secondary | ICD-10-CM | POA: Diagnosis present

## 2022-07-16 DIAGNOSIS — T8141XA Infection following a procedure, superficial incisional surgical site, initial encounter: Secondary | ICD-10-CM | POA: Diagnosis present

## 2022-07-16 DIAGNOSIS — K111 Hypertrophy of salivary gland: Secondary | ICD-10-CM | POA: Diagnosis present

## 2022-07-16 DIAGNOSIS — R9431 Abnormal electrocardiogram [ECG] [EKG]: Secondary | ICD-10-CM | POA: Diagnosis present

## 2022-07-16 DIAGNOSIS — E739 Lactose intolerance, unspecified: Secondary | ICD-10-CM | POA: Diagnosis present

## 2022-07-16 DIAGNOSIS — R1310 Dysphagia, unspecified: Secondary | ICD-10-CM | POA: Diagnosis present

## 2022-07-16 DIAGNOSIS — K047 Periapical abscess without sinus: Secondary | ICD-10-CM | POA: Diagnosis present

## 2022-07-16 DIAGNOSIS — Z79620 Long term (current) use of immunosuppressive biologic: Secondary | ICD-10-CM

## 2022-07-16 DIAGNOSIS — Z6841 Body Mass Index (BMI) 40.0 and over, adult: Secondary | ICD-10-CM

## 2022-07-16 DIAGNOSIS — Z885 Allergy status to narcotic agent status: Secondary | ICD-10-CM

## 2022-07-16 DIAGNOSIS — Z9842 Cataract extraction status, left eye: Secondary | ICD-10-CM

## 2022-07-16 DIAGNOSIS — R221 Localized swelling, mass and lump, neck: Principal | ICD-10-CM

## 2022-07-16 DIAGNOSIS — E876 Hypokalemia: Secondary | ICD-10-CM | POA: Insufficient documentation

## 2022-07-16 DIAGNOSIS — M272 Inflammatory conditions of jaws: Secondary | ICD-10-CM | POA: Diagnosis present

## 2022-07-16 DIAGNOSIS — B9689 Other specified bacterial agents as the cause of diseases classified elsewhere: Secondary | ICD-10-CM | POA: Insufficient documentation

## 2022-07-16 DIAGNOSIS — Z888 Allergy status to other drugs, medicaments and biological substances status: Secondary | ICD-10-CM

## 2022-07-16 DIAGNOSIS — K219 Gastro-esophageal reflux disease without esophagitis: Secondary | ICD-10-CM | POA: Diagnosis present

## 2022-07-16 DIAGNOSIS — K122 Cellulitis and abscess of mouth: Secondary | ICD-10-CM | POA: Diagnosis not present

## 2022-07-16 DIAGNOSIS — K121 Other forms of stomatitis: Secondary | ICD-10-CM | POA: Insufficient documentation

## 2022-07-16 DIAGNOSIS — Z818 Family history of other mental and behavioral disorders: Secondary | ICD-10-CM

## 2022-07-16 LAB — CBC WITH DIFFERENTIAL/PLATELET
Abs Immature Granulocytes: 0.1 10*3/uL — ABNORMAL HIGH (ref 0.00–0.07)
Basophils Absolute: 0.1 10*3/uL (ref 0.0–0.1)
Basophils Relative: 0 %
Eosinophils Absolute: 0.2 10*3/uL (ref 0.0–0.5)
Eosinophils Relative: 1 %
HCT: 37.8 % (ref 36.0–46.0)
Hemoglobin: 11.7 g/dL — ABNORMAL LOW (ref 12.0–15.0)
Immature Granulocytes: 1 %
Lymphocytes Relative: 15 %
Lymphs Abs: 2.9 10*3/uL (ref 0.7–4.0)
MCH: 22.8 pg — ABNORMAL LOW (ref 26.0–34.0)
MCHC: 31 g/dL (ref 30.0–36.0)
MCV: 73.7 fL — ABNORMAL LOW (ref 80.0–100.0)
Monocytes Absolute: 1.7 10*3/uL — ABNORMAL HIGH (ref 0.1–1.0)
Monocytes Relative: 9 %
Neutro Abs: 14.4 10*3/uL — ABNORMAL HIGH (ref 1.7–7.7)
Neutrophils Relative %: 74 %
Platelets: 572 10*3/uL — ABNORMAL HIGH (ref 150–400)
RBC: 5.13 MIL/uL — ABNORMAL HIGH (ref 3.87–5.11)
RDW: 24.1 % — ABNORMAL HIGH (ref 11.5–15.5)
WBC: 19.4 10*3/uL — ABNORMAL HIGH (ref 4.0–10.5)
nRBC: 0.1 % (ref 0.0–0.2)

## 2022-07-16 LAB — URINALYSIS, W/ REFLEX TO CULTURE (INFECTION SUSPECTED)
Bacteria, UA: NONE SEEN
Bilirubin Urine: NEGATIVE
Glucose, UA: NEGATIVE mg/dL
Hgb urine dipstick: NEGATIVE
Ketones, ur: NEGATIVE mg/dL
Leukocytes,Ua: NEGATIVE
Nitrite: NEGATIVE
Specific Gravity, Urine: >1.046 — ABNORMAL HIGH (ref 1.005–1.030)
pH: 6 (ref 5.0–8.0)

## 2022-07-16 LAB — COMPREHENSIVE METABOLIC PANEL
ALT: 17 U/L (ref 0–44)
AST: 14 U/L — ABNORMAL LOW (ref 15–41)
Albumin: 4.2 g/dL (ref 3.5–5.0)
Alkaline Phosphatase: 97 U/L (ref 38–126)
Anion gap: 12 (ref 5–15)
BUN: 12 mg/dL (ref 6–20)
CO2: 24 mmol/L (ref 22–32)
Calcium: 9.4 mg/dL (ref 8.9–10.3)
Chloride: 101 mmol/L (ref 98–111)
Creatinine, Ser: 0.85 mg/dL (ref 0.44–1.00)
GFR, Estimated: >60 mL/min (ref >60)
Glucose, Bld: 100 mg/dL — ABNORMAL HIGH (ref 70–99)
Potassium: 2.9 mmol/L — ABNORMAL LOW (ref 3.5–5.1)
Sodium: 137 mmol/L (ref 135–145)
Total Bilirubin: 0.6 mg/dL (ref 0.3–1.2)
Total Protein: 8.4 g/dL — ABNORMAL HIGH (ref 6.5–8.1)

## 2022-07-16 LAB — PROTIME-INR
INR: 1 (ref 0.8–1.2)
Prothrombin Time: 13.5 seconds (ref 11.4–15.2)

## 2022-07-16 LAB — LACTIC ACID, PLASMA
Lactic Acid, Venous: 1.3 mmol/L (ref 0.5–1.9)
Lactic Acid, Venous: 1.1 mmol/L (ref 0.5–1.9)

## 2022-07-16 LAB — HCG, SERUM, QUALITATIVE: Preg, Serum: NEGATIVE

## 2022-07-16 MED ORDER — POTASSIUM CHLORIDE 10 MEQ/100ML IV SOLN
10.0000 meq | INTRAVENOUS | Status: DC
  Administered 2022-07-16: 10 meq via INTRAVENOUS
  Filled 2022-07-16: qty 100

## 2022-07-16 MED ORDER — ACETAMINOPHEN 325 MG PO TABS
650.0000 mg | ORAL_TABLET | Freq: Four times a day (QID) | ORAL | Status: DC | PRN
  Administered 2022-07-18 – 2022-07-20 (×4): 650 mg via ORAL
  Filled 2022-07-16 (×4): qty 2

## 2022-07-16 MED ORDER — OXYCODONE HCL 5 MG PO TABS
5.0000 mg | ORAL_TABLET | Freq: Four times a day (QID) | ORAL | Status: DC | PRN
  Administered 2022-07-16 – 2022-07-27 (×20): 5 mg via ORAL
  Filled 2022-07-16 (×20): qty 1

## 2022-07-16 MED ORDER — HYDROMORPHONE HCL 1 MG/ML IJ SOLN
1.0000 mg | Freq: Once | INTRAMUSCULAR | Status: AC
  Administered 2022-07-16: 1 mg via INTRAVENOUS
  Filled 2022-07-16: qty 1

## 2022-07-16 MED ORDER — QUETIAPINE FUMARATE 25 MG PO TABS
25.0000 mg | ORAL_TABLET | Freq: Three times a day (TID) | ORAL | Status: DC
  Administered 2022-07-16 – 2022-07-27 (×33): 25 mg via ORAL
  Filled 2022-07-16 (×33): qty 1

## 2022-07-16 MED ORDER — POTASSIUM CHLORIDE 2 MEQ/ML IV SOLN
INTRAVENOUS | Status: DC
  Filled 2022-07-16 (×2): qty 1000

## 2022-07-16 MED ORDER — TOPIRAMATE 25 MG PO TABS
50.0000 mg | ORAL_TABLET | Freq: Two times a day (BID) | ORAL | Status: DC
  Administered 2022-07-16 – 2022-07-27 (×22): 50 mg via ORAL
  Filled 2022-07-16 (×22): qty 2

## 2022-07-16 MED ORDER — POLYETHYLENE GLYCOL 3350 17 G PO PACK: 17.0000 g | PACK | Freq: Every day | ORAL | Status: DC | PRN

## 2022-07-16 MED ORDER — METRONIDAZOLE 500 MG/100ML IV SOLN
500.0000 mg | Freq: Once | INTRAVENOUS | Status: AC
  Administered 2022-07-16: 500 mg via INTRAVENOUS
  Filled 2022-07-16: qty 100

## 2022-07-16 MED ORDER — SACCHAROMYCES BOULARDII 250 MG PO CAPS
250.0000 mg | ORAL_CAPSULE | Freq: Two times a day (BID) | ORAL | Status: DC
  Administered 2022-07-16 – 2022-07-27 (×22): 250 mg via ORAL
  Filled 2022-07-16 (×22): qty 1

## 2022-07-16 MED ORDER — SUMATRIPTAN SUCCINATE 50 MG PO TABS: 50.0000 mg | ORAL_TABLET | ORAL | Status: DC | PRN

## 2022-07-16 MED ORDER — ATORVASTATIN CALCIUM 10 MG PO TABS
20.0000 mg | ORAL_TABLET | Freq: Every day | ORAL | Status: DC
  Administered 2022-07-17 – 2022-07-26 (×10): 20 mg via ORAL
  Filled 2022-07-16 (×10): qty 2

## 2022-07-16 MED ORDER — BUPROPION HCL ER (XL) 150 MG PO TB24
150.0000 mg | ORAL_TABLET | Freq: Every morning | ORAL | Status: DC
  Administered 2022-07-17 – 2022-07-27 (×11): 150 mg via ORAL
  Filled 2022-07-16 (×11): qty 1

## 2022-07-16 MED ORDER — PROCHLORPERAZINE EDISYLATE 10 MG/2ML IJ SOLN: 5.0000 mg | Freq: Four times a day (QID) | INTRAMUSCULAR | Status: DC | PRN

## 2022-07-16 MED ORDER — IOHEXOL 300 MG/ML  SOLN
100.0000 mL | Freq: Once | INTRAMUSCULAR | Status: AC | PRN
  Administered 2022-07-16: 75 mL via INTRAVENOUS

## 2022-07-16 MED ORDER — LORATADINE 10 MG PO TABS
10.0000 mg | ORAL_TABLET | Freq: Every day | ORAL | Status: DC
  Administered 2022-07-17 – 2022-07-27 (×11): 10 mg via ORAL
  Filled 2022-07-16 (×11): qty 1

## 2022-07-16 MED ORDER — HYDROMORPHONE HCL 1 MG/ML IJ SOLN
0.5000 mg | INTRAMUSCULAR | Status: DC | PRN
  Administered 2022-07-16 – 2022-07-21 (×14): 0.5 mg via INTRAVENOUS
  Filled 2022-07-16 (×14): qty 0.5

## 2022-07-16 MED ORDER — METRONIDAZOLE 500 MG/100ML IV SOLN: 500.0000 mg | Freq: Two times a day (BID) | INTRAVENOUS | Status: DC

## 2022-07-16 MED ORDER — MESALAMINE 1.2 G PO TBEC
1.2000 g | DELAYED_RELEASE_TABLET | Freq: Every day | ORAL | Status: DC
  Administered 2022-07-17 – 2022-07-27 (×11): 1.2 g via ORAL
  Filled 2022-07-16 (×11): qty 1

## 2022-07-16 MED ORDER — SODIUM CHLORIDE 0.9 % IV SOLN: INTRAVENOUS | Status: DC | PRN

## 2022-07-16 MED ORDER — ONDANSETRON HCL 4 MG/2ML IJ SOLN
4.0000 mg | Freq: Once | INTRAMUSCULAR | Status: AC
  Administered 2022-07-16: 4 mg via INTRAVENOUS
  Filled 2022-07-16: qty 2

## 2022-07-16 MED ORDER — PROCHLORPERAZINE EDISYLATE 10 MG/2ML IJ SOLN
10.0000 mg | Freq: Once | INTRAMUSCULAR | Status: AC
  Administered 2022-07-16: 10 mg via INTRAVENOUS
  Filled 2022-07-16: qty 2

## 2022-07-16 MED ORDER — PROMETHAZINE HCL 50 MG RE SUPP: 50.0000 mg | Freq: Four times a day (QID) | RECTAL | Status: DC | PRN

## 2022-07-16 MED ORDER — ENOXAPARIN SODIUM 40 MG/0.4ML IJ SOSY
40.0000 mg | PREFILLED_SYRINGE | Freq: Every day | INTRAMUSCULAR | Status: DC
  Administered 2022-07-17 – 2022-07-27 (×11): 40 mg via SUBCUTANEOUS
  Filled 2022-07-16 (×11): qty 0.4

## 2022-07-16 MED ORDER — SODIUM CHLORIDE 0.9 % IV BOLUS
1000.0000 mL | Freq: Once | INTRAVENOUS | Status: AC
  Administered 2022-07-16: 1000 mL via INTRAVENOUS

## 2022-07-16 MED ORDER — POTASSIUM CHLORIDE 10 MEQ/100ML IV SOLN
10.0000 meq | INTRAVENOUS | Status: AC
  Administered 2022-07-16: 10 meq via INTRAVENOUS
  Filled 2022-07-16: qty 100

## 2022-07-16 MED ORDER — GABAPENTIN 300 MG PO CAPS
300.0000 mg | ORAL_CAPSULE | Freq: Three times a day (TID) | ORAL | Status: DC
  Administered 2022-07-16 – 2022-07-27 (×33): 300 mg via ORAL
  Filled 2022-07-16 (×33): qty 1

## 2022-07-16 MED ORDER — HYDROXYZINE HCL 25 MG PO TABS
25.0000 mg | ORAL_TABLET | Freq: Three times a day (TID) | ORAL | Status: DC | PRN
  Administered 2022-07-18 – 2022-07-22 (×4): 25 mg via ORAL
  Filled 2022-07-16 (×4): qty 1

## 2022-07-16 MED ORDER — CLINDAMYCIN PHOSPHATE 600 MG/50ML IV SOLN
600.0000 mg | Freq: Four times a day (QID) | INTRAVENOUS | Status: DC
  Administered 2022-07-17 (×3): 600 mg via INTRAVENOUS
  Filled 2022-07-16 (×4): qty 50

## 2022-07-16 MED ORDER — DULOXETINE HCL 60 MG PO CPEP
60.0000 mg | ORAL_CAPSULE | Freq: Every day | ORAL | Status: DC
  Administered 2022-07-17 – 2022-07-27 (×11): 60 mg via ORAL
  Filled 2022-07-16 (×11): qty 1

## 2022-07-16 NOTE — ED Triage Notes (Signed)
Root canal to left back tooth about 2 weeks ago. Still hurting through the weekend, but had to go to ED for pain . Dentist on Monday, adjusted some things. Friday she felt lump under her tongue, so Friday pulled the tooth. Yesterday with golf ball bump under left jawand up to her ear.

## 2022-07-16 NOTE — ED Notes (Signed)
Patient transported to CT 

## 2022-07-16 NOTE — H&P (Signed)
History and Physical  Rebekah Silva ZOX:[id number] DOB: April 12, 1970 DOA: 07/16/2022  Referring physician: Accepted by Dr. Chipper Herb [facility], hospitalist service. PCP: Laurann Montana, MD  Outpatient Specialists: Behavioral health, gynecology. Patient coming from: Home.  Chief Complaint: Post root canal surgical complications  HPI: Rebekah Silva is a 52 y.o. female with medical history significant for severe morbid obesity, ulcerative colitis on mesalamine, migraine headache, chronic anxiety/depression, post root canal to left back tooth about 2 weeks ago, was still hurting through last weekend.  She saw the dentist on Monday 07/10/22, medications were adjusted.  On Friday 3 days ago she felt a lump under her tongue and yesterday she felt a golf ball bump under her left jaw up to her ear.  She presented to [facility] ED for further evaluation.  In the ED, vital signs are stable.  Lab studies notable for WBC 19.4, hemoglobin 11.7, platelet count 572, neutrophil count 14.4, serum potassium 2.9, lactic acid 1.1.  CT soft tissue neck with contrast revealed the following findings: Inflammatory changes at the floor of the mouth on the left adjacent to the medial aspect of the body of the mandible. This is presumably subsequent to the recent extraction of tooth 18. No sign of bone destruction however. There is nonspecific inflammatory edema of the soft tissues underneath the chin and of the left side of the lower face and upper neck. Reactive level 1 nodal enlargement on the left without suppuration. Mild secondary inflammatory enlargement of the left submandibular gland.  EDP discussed the case with ENT and oral surgery.  Recommended medical management, no plan for surgical intervention at this time.  The patient received IV clindamycin, IV Flagyl and IV opioid-based analgesics.  Admitted to [facility], hospitalist service, Dr. Chipper Herb and transferred to [facility] MedSurg unit as observation  status.  ED Course:   Review of Systems: Review of systems as noted in the HPI. All other systems reviewed and are negative.   Past Medical History:  Diagnosis Date   Anxiety    Arthritis    ra   Colitis    Depression    GERD (gastroesophageal reflux disease)    Hypercholesterolemia    DR. WHITE   Migraines    PIH (pregnancy induced hypertension) yrs ago, none since   PONV (postoperative nausea and vomiting)    Wears glasses    Past Surgical History:  Procedure Laterality Date   ANAL FISSURE REPAIR N/A 04/09/2015   Procedure: ANAL CHEMICAL DENERVATION(BOTOX);  Surgeon: Axel Filler, MD;  Location: [facility] OR;  Service: General;  Laterality: N/A;   ANAL FISSURE REPAIR  07/06/2016   CATARACT EXTRACTION Left    CESAREAN SECTION  2007   COLONOSCOPY     INTRAUTERINE DEVICE INSERTION  12/15/2005   MOUTH SURGERY     RECTAL EXAM UNDER ANESTHESIA N/A 02/17/2015   Procedure: RECTAL EXAM UNDER ANESTHESIA;  Surgeon: Axel Filler, MD;  Location: [facility] ORS;  Service: General;  Laterality: N/A;   RETINAL DETACHMENT SURGERY  2019   X 5    SPHINCTEROTOMY N/A 02/17/2015   Procedure: LATERAL INTERNAL SPHINCTEROTOMY;  Surgeon: Axel Filler, MD;  Location: [facility] ORS;  Service: General;  Laterality: N/A;    Social History:  reports that Rebekah Silva has never smoked. Rebekah Silva has never used smokeless tobacco. Rebekah Silva reports current alcohol use. Rebekah Courts Niemczyk reports current drug use. Drug: Marijuana.   Allergies  Allergen Reactions   Aspirin Other (See Comments)    Ulcerative  colitis, abdominal pain   Ceftin [Cefuroxime] Other (See Comments)    Diarrhea, abdominal cramping   Codeine Other (See Comments)    Severe headaches   Lactose Other (See Comments)    Dairy - sets off my colitis   Other Rash and Other (See Comments)    Contact metal agents    Penicillins Rash and Other (See Comments)    Has patient had a PCN reaction causing immediate rash,  facial/tongue/throat swelling, SOB or lightheadedness with hypotension: unknown Has patient had a PCN reaction causing severe rash involving mucus membranes or skin necrosis: unknown Has patient had a PCN reaction that required hospitalization unknown Has patient had a PCN reaction occurring within the last 10 years: unknown If all of the above answers are "NO", then may proceed with Cephalosporin use.     Family History  Problem Relation Age of Onset   Anxiety disorder Mother    Depression Mother    Breast cancer Mother        mastectomy- all through the ducts of left breast    Hypertension Father    Breast cancer Maternal Aunt    Cancer Maternal Grandfather        colon?   Diabetes Paternal Grandmother    Heart disease Paternal Grandmother       Prior to Admission medications   Medication Sig Start Date End Date Taking? Authorizing Provider  atorvastatin (LIPITOR) 20 MG tablet Take 20 mg by mouth at bedtime. 04/29/21   [provider]  buPROPion (WELLBUTRIN XL) 150 MG 24 hr tablet Take 150 mg by mouth every morning. 04/17/21   [provider]  Calcium Carbonate-Vitamin D 600-400 MG-UNIT tablet Take 1 tablet by mouth at bedtime.    [provider]  cetirizine (ZYRTEC) 10 MG tablet Take 10 mg by mouth daily.    [provider]  DULoxetine (CYMBALTA) 60 MG capsule Take 1 capsule (60 mg total) by mouth daily. 06/03/21   Massengill, Harrold Donath, MD  fluconazole (DIFLUCAN) 150 MG tablet Take 1 tablet (150 mg total) by mouth every 3 (three) days. 06/12/22   Olivia Mackie, NP  gabapentin (NEURONTIN) 300 MG capsule Take 1 capsule (300 mg total) by mouth 3 (three) times daily. 06/22/21 09/01/21  Oneta Rack, NP  hydrOXYzine (ATARAX) 25 MG tablet Take 1 tablet (25 mg total) by mouth 3 (three) times daily as needed for anxiety. 06/02/21   Massengill, Harrold Donath, MD  latanoprost (XALATAN) 0.005 % ophthalmic solution Place 1 drop into both eyes at bedtime. 05/09/21    [provider]  mesalamine (LIALDA) 1.2 G EC tablet Take 1.2 g by mouth daily.    [provider]  metroNIDAZOLE (FLAGYL) 500 MG tablet Take 1 tablet (500 mg total) by mouth 2 (two) times daily. 06/12/22   Olivia Mackie, NP  Multiple Vitamin (MULTIVITAMIN WITH MINERALS) TABS tablet Take 1 tablet by mouth daily.    [provider]  promethazine (PHENERGAN) 50 MG suppository Place 50 mg rectally every 6 (six) hours as needed for nausea or vomiting.    [provider]  QUEtiapine (SEROQUEL) 25 MG tablet Take 1 tablet (25 mg total) by mouth 3 (three) times daily. 06/22/21 09/01/21  Oneta Rack, NP  rizatriptan (MAXALT) 10 MG tablet Take 10 mg by mouth as needed for migraine. May repeat in 2 hours if needed    [provider]  topiramate (TOPAMAX) 50 MG tablet Take 1 tablet (50 mg total) by mouth every 12 (  twelve) hours. 06/02/21 09/01/21  Phineas Inches, MD    Physical Exam: BP 115/61 (BP Location: Right Arm)   Pulse 99   Temp 98.8 F (37.1 C) (Oral)   Resp 15   Ht 5\' 7"  (1.702 m)   Wt 124.3 kg   SpO2 99%   BMI 42.91 kg/m   General: 52 y.o. year-old female well developed well nourished in no acute distress.  Alert and oriented x3. Cardiovascular: Regular rate and rhythm with no rubs or gallops.  No thyromegaly or JVD noted.  No lower extremity edema. 2/4 pulses in all 4 extremities. Respiratory: Clear to auscultation with no wheezes or rales. Good inspiratory effort. Abdomen: Soft nontender nondistended with normal bowel sounds x4 quadrants. Muskuloskeletal: No cyanosis, clubbing or edema noted bilaterally Neuro: CN II-XII intact, strength, sensation, reflexes Skin: No ulcerative lesions noted or rashes Psychiatry: Judgement and insight appear normal. Mood is appropriate for condition and setting          Labs on Admission:  Basic Metabolic Panel: Recent Labs  Lab 07/16/22 1525  NA 137  K 2.9*  CL 101  CO2 24  GLUCOSE 100*   BUN 12  CREATININE 0.85  CALCIUM 9.4   Liver Function Tests: Recent Labs  Lab 07/16/22 1525  AST 14*  ALT 17  ALKPHOS 97  BILITOT 0.6  PROT 8.4*  ALBUMIN 4.2   No results for input(s): "LIPASE", "AMYLASE" in the last 168 hours. No results for input(s): "AMMONIA" in the last 168 hours. CBC: Recent Labs  Lab 07/16/22 1525  WBC 19.4*  NEUTROABS 14.4*  HGB 11.7*  HCT 37.8  MCV 73.7*  PLT 572*   Cardiac Enzymes: No results for input(s): "CKTOTAL", "CKMB", "CKMBINDEX", "TROPONINI" in the last 168 hours.  BNP (last 3 results) No results for input(s): "BNP" in the last 8760 hours.  ProBNP (last 3 results) No results for input(s): "PROBNP" in the last 8760 hours.  CBG: No results for input(s): "GLUCAP" in the last 168 hours.  Radiological Exams on Admission: CT Soft Tissue Neck W Contrast  Result Date: 07/16/2022 CLINICAL DATA:  Swelling of the left side of the neck following tooth extraction 2 weeks ago. EXAM: CT NECK WITH CONTRAST TECHNIQUE: Multidetector CT imaging of the neck was performed using the standard protocol following the bolus administration of intravenous contrast. RADIATION DOSE REDUCTION: This exam was performed according to the departmental dose-optimization program which includes automated exposure control, adjustment of the mA and/or kV according to patient size and/or use of iterative reconstruction technique. CONTRAST:  75mL OMNIPAQUE IOHEXOL 300 MG/ML  SOLN COMPARISON:  None Available. FINDINGS: Pharynx and larynx: No mucosal lesion. There are inflammatory changes at the floor of the mouth on the left adjacent to the medial aspect of the body of the mandible consistent with infectious inflammatory inflammation. This is presumably subsequent to the recent extraction of tooth 18. No sign of bone destruction however. There is mild inflammatory enlargement of the adjacent submandibular gland. There is nonspecific inflammatory edema of the soft tissues underneath  the chin and of the left side of the lower face and upper neck. Salivary glands: Parotid glands are normal. As above, there is probably some secondary inflammation of the left submandibular gland. Thyroid: Normal Lymph nodes: Reactive level 1 nodal enlargement on the left without suppuration. Vascular: No septic thrombosis.  No arterial disease. Limited intracranial: Normal Visualized orbits: Normal Mastoids and visualized paranasal sinuses: Clear Skeleton: Otherwise no significant skeletal finding. Upper chest: Normal Other: None IMPRESSION:  Inflammatory changes at the floor of the mouth on the left adjacent to the medial aspect of the body of the mandible. This is presumably subsequent to the recent extraction of tooth 18. No sign of bone destruction however. There is nonspecific inflammatory edema of the soft tissues underneath the chin and of the left side of the lower face and upper neck. Reactive level 1 nodal enlargement on the left without suppuration. Mild secondary inflammatory enlargement of the left submandibular gland. Electronically Signed   By: Paulina Fusi M.D.   On: 07/16/2022 17:15   DG Chest Port 1 View  Result Date: 07/16/2022 CLINICAL DATA:  Sepsis. EXAM: PORTABLE CHEST 1 VIEW COMPARISON:  None Available. FINDINGS: Clear lungs. Normal heart size and mediastinal contours. No pleural effusion or pneumothorax. Visualized bones and upper abdomen are unremarkable. IMPRESSION: No evidence of acute cardiopulmonary disease. Electronically Signed   By: Orvan Falconer M.D.   On: 07/16/2022 15:11    EKG: I independently viewed the EKG done and my findings are as followed: Sinus rhythm rate of 95.  Nonspecific ST-T changes.  QTc 565.  Assessment/Plan Present on Admission:  Oral infection  Principal Problem:   Oral infection Active Problems:   Bacterial oral infection  Sepsis secondary to bacterial oral infection post root canal surgery, POA Presented with leukocytosis WBC greater than  19,000, heart rate 121 CT soft tissue with findings as stated above. EDP discussed the case with oral surgery and ENT as stated above Received IV clindamycin and IV Flagyl in the ED Continue IV clindamycin Add Florastor 250 mg twice daily. Add IV fluid hydration LR KCl 40 mill equivalent at 75 cc/h x 1 day. Follow blood cultures x 2 peripherally Monitor fever curve and WBC  QTc prolongation Admission twelve-lead EKG 561 Avoid QTc prolonging agents Optimize magnesium and potassium levels Goal magnesium greater than 2.0 Goal potassium greater than 4.0.  Chronic anxiety/depression Resume home regimen  Polyneuropathy Resume home gabapentin  Hyperlipidemia Resume home Lipitor  Migraine headache Resume home Topamax  Obesity BMI 42 Recommend weight loss outpatient with regular physical activity and healthy dieting.    DVT prophylaxis: Subcu Lovenox daily  Code Status: Full code.  Family Communication: None at bedside.  Disposition Plan: Admitted to MedSurg unit  Consults called: ENT, oral surgery  Admission status: Observation status.   Status is: Observation    Darlin Drop MD [facility] Pager [phone]  If 7PM-7AM, please contact night-coverage [email or web address] Password [facility]  07/16/2022, 10:53 PM

## 2022-07-16 NOTE — ED Notes (Signed)
Rebekah Silva at [facility] will send transport for Bed Ready at [facility] Room# 13.-ABB(NS)

## 2022-07-16 NOTE — ED Triage Notes (Signed)
Pt has been on antibiotics since she had the root canal.

## 2022-07-16 NOTE — ED Provider Notes (Signed)
[place] EMERGENCY DEPARTMENT AT [facility] Provider Note   CSN: [id number] Arrival date & time: 07/16/22  1405     History  Chief Complaint  Patient presents with   Jaw Pain    Rebekah Silva is a 52 y.o. female history of ulcerative colitis, MDD, GAD presented with 2 weeks of left jaw pain.  Patient originally had root canal placed and then continued to have pain and swelling and so the tooth was extracted 2 days ago however patient stated that this morning she woke up with severe swelling under her left jaw with left jaw pain.  Patient was seen earlier today and referred to ED for CT evaluation for possible abscess.  Patient states she has not been able to eat or drink due to the pain and is able to open her jaw however opening her jaw causes extreme levels of pain.  Patient reported not eating the past 3 days due to the pain.  Patient states that she has not had any hearing changes or ear pain with this but does endorse dysphagia.  Patient states that it hurts to press on the swelling underneath her left jaw.   Patient denies chest pain, shortness of breath, fever/chills, nausea/vomiting, abdominal pain, skin color changes, vision changes, headache  Home Medications Prior to Admission medications   Medication Sig Start Date End Date Taking? Authorizing Provider  atorvastatin (LIPITOR) 20 MG tablet Take 20 mg by mouth at bedtime. 04/29/21   [provider]  buPROPion (WELLBUTRIN XL) 150 MG 24 hr tablet Take 150 mg by mouth every morning. 04/17/21   [provider]  Calcium Carbonate-Vitamin D 600-400 MG-UNIT tablet Take 1 tablet by mouth at bedtime.    [provider]  cetirizine (ZYRTEC) 10 MG tablet Take 10 mg by mouth daily.    [provider]  DULoxetine (CYMBALTA) 60 MG capsule Take 1 capsule (60 mg total) by mouth daily. 06/03/21   Massengill, Harrold Donath, MD  fluconazole (DIFLUCAN) 150 MG tablet Take 1 tablet (150 mg total) by mouth every  3 (three) days. 06/12/22   Olivia Mackie, NP  gabapentin (NEURONTIN) 300 MG capsule Take 1 capsule (300 mg total) by mouth 3 (three) times daily. 06/22/21 09/01/21  Oneta Rack, NP  hydrOXYzine (ATARAX) 25 MG tablet Take 1 tablet (25 mg total) by mouth 3 (three) times daily as needed for anxiety. 06/02/21   Massengill, Harrold Donath, MD  latanoprost (XALATAN) 0.005 % ophthalmic solution Place 1 drop into both eyes at bedtime. 05/09/21   [provider]  mesalamine (LIALDA) 1.2 G EC tablet Take 1.2 g by mouth daily.    [provider]  metroNIDAZOLE (FLAGYL) 500 MG tablet Take 1 tablet (500 mg total) by mouth 2 (two) times daily. 06/12/22   Olivia Mackie, NP  Multiple Vitamin (MULTIVITAMIN WITH MINERALS) TABS tablet Take 1 tablet by mouth daily.    [provider]  promethazine (PHENERGAN) 50 MG suppository Place 50 mg rectally every 6 (six) hours as needed for nausea or vomiting.    [provider]  QUEtiapine (SEROQUEL) 25 MG tablet Take 1 tablet (25 mg total) by mouth 3 (three) times daily. 06/22/21 09/01/21  Oneta Rack, NP  rizatriptan (MAXALT) 10 MG tablet Take 10 mg by mouth as needed for migraine. May repeat in 2 hours if needed    [provider]  topiramate (TOPAMAX) 50 MG tablet Take 1 tablet (50 mg total) by mouth every 12 (twelve) hours. 06/02/21  09/01/21  Massengill, Harrold Donath, MD      Allergies    Aspirin, Ceftin [cefuroxime], Codeine, Lactose, Other, and Penicillins    Review of Systems   Review of Systems See HPI Physical Exam Updated Vital Signs BP 116/79   Pulse 95   Temp 98.9 F (37.2 C) (Oral)   Resp 15   Ht 5\' 7"  (1.702 m)   Wt 124.3 kg   SpO2 100%   BMI 42.91 kg/m  Physical Exam Constitutional:      Appearance: Rebekah Silva is ill-appearing.  HENT:     Right Ear: Tympanic membrane, ear canal and external ear normal.     Left Ear: Tympanic membrane, ear canal and external ear normal.     Nose: Nose normal.      Mouth/Throat:     Mouth: Mucous membranes are moist.     Comments: No edema noted No oral floor elevation No purulent drainage No exposed bone No PTA Eyes:     Extraocular Movements: Extraocular movements intact.     Conjunctiva/sclera: Conjunctivae normal.     Pupils: Pupils are equal, round, and reactive to light.  Neck:     Comments: Left-sided submandibular swelling without skin color changes but tender to palpation Patient able to swallow with equal muscle use Cardiovascular:     Rate and Rhythm: Regular rhythm. Tachycardia present.     Pulses: Normal pulses.  Skin:    General: Skin is warm and dry.  Neurological:     Mental Status: Rebekah Silva is alert.     ED Results / Procedures / Treatments   Labs (all labs ordered are listed, but only abnormal results are displayed) Labs Reviewed  COMPREHENSIVE METABOLIC PANEL - Abnormal; Notable for the following components:      Result Value   Potassium 2.9 (*)    Glucose, Bld 100 (*)    Total Protein 8.4 (*)    AST 14 (*)    All other components within normal limits  CBC WITH DIFFERENTIAL/PLATELET - Abnormal; Notable for the following components:   WBC 19.4 (*)    RBC 5.13 (*)    Hemoglobin 11.7 (*)    MCV 73.7 (*)    MCH 22.8 (*)    RDW 24.1 (*)    Platelets 572 (*)    Neutro Abs 14.4 (*)    Monocytes Absolute 1.7 (*)    Abs Immature Granulocytes 0.10 (*)    All other components within normal limits  URINALYSIS, W/ REFLEX TO CULTURE (INFECTION SUSPECTED) - Abnormal; Notable for the following components:   Specific Gravity, Urine >1.046 (*)    Protein, ur TRACE (*)    All other components within normal limits  CULTURE, BLOOD (ROUTINE X 2)  CULTURE, BLOOD (ROUTINE X 2)  LACTIC ACID, PLASMA  LACTIC ACID, PLASMA  PROTIME-INR  HCG, SERUM, QUALITATIVE    EKG EKG Interpretation  Date/Time:  Sunday Jul 16 2022 15:44:23 EDT Ventricular Rate:  95 PR Interval:  150 QRS Duration: 76 QT Interval:  446 QTC  Calculation: 561 R Axis:   33 Text Interpretation: Sinus rhythm Low voltage, precordial leads Borderline abnrm T, anterolateral leads Prolonged QT interval Confirmed by Alvino Blood ([id number]) on 07/16/2022 5:10:05 PM  Radiology CT Soft Tissue Neck W Contrast  Result Date: 07/16/2022 CLINICAL DATA:  Swelling of the left side of the neck following tooth extraction 2 weeks ago. EXAM: CT NECK WITH CONTRAST TECHNIQUE: Multidetector CT imaging of the neck was performed using the standard  protocol following the bolus administration of intravenous contrast. RADIATION DOSE REDUCTION: This exam was performed according to the departmental dose-optimization program which includes automated exposure control, adjustment of the mA and/or kV according to patient size and/or use of iterative reconstruction technique. CONTRAST:  75mL OMNIPAQUE IOHEXOL 300 MG/ML  SOLN COMPARISON:  None Available. FINDINGS: Pharynx and larynx: No mucosal lesion. There are inflammatory changes at the floor of the mouth on the left adjacent to the medial aspect of the body of the mandible consistent with infectious inflammatory inflammation. This is presumably subsequent to the recent extraction of tooth 18. No sign of bone destruction however. There is mild inflammatory enlargement of the adjacent submandibular gland. There is nonspecific inflammatory edema of the soft tissues underneath the chin and of the left side of the lower face and upper neck. Salivary glands: Parotid glands are normal. As above, there is probably some secondary inflammation of the left submandibular gland. Thyroid: Normal Lymph nodes: Reactive level 1 nodal enlargement on the left without suppuration. Vascular: No septic thrombosis.  No arterial disease. Limited intracranial: Normal Visualized orbits: Normal Mastoids and visualized paranasal sinuses: Clear Skeleton: Otherwise no significant skeletal finding. Upper chest: Normal Other: None IMPRESSION: Inflammatory changes  at the floor of the mouth on the left adjacent to the medial aspect of the body of the mandible. This is presumably subsequent to the recent extraction of tooth 18. No sign of bone destruction however. There is nonspecific inflammatory edema of the soft tissues underneath the chin and of the left side of the lower face and upper neck. Reactive level 1 nodal enlargement on the left without suppuration. Mild secondary inflammatory enlargement of the left submandibular gland. Electronically Signed   By: Paulina Fusi M.D.   On: 07/16/2022 17:15   DG Chest Port 1 View  Result Date: 07/16/2022 CLINICAL DATA:  Sepsis. EXAM: PORTABLE CHEST 1 VIEW COMPARISON:  None Available. FINDINGS: Clear lungs. Normal heart size and mediastinal contours. No pleural effusion or pneumothorax. Visualized bones and upper abdomen are unremarkable. IMPRESSION: No evidence of acute cardiopulmonary disease. Electronically Signed   By: Orvan Falconer M.D.   On: 07/16/2022 15:11    Procedures .Critical Care  Performed by: Netta Corrigan, PA-C Authorized by: Netta Corrigan, PA-C   Critical care provider statement:    Critical care time (minutes):  30   Critical care time was exclusive of:  Separately billable procedures and treating other patients   Critical care was necessary to treat or prevent imminent or life-threatening deterioration of the following conditions: neck edema, N/V.   Critical care was time spent personally by me on the following activities:  Development of treatment plan with patient or surrogate, blood draw for specimens, discussions with consultants, evaluation of patient's response to treatment, examination of patient, obtaining history from patient or surrogate, review of old charts, re-evaluation of patient's condition, pulse oximetry, ordering and review of radiographic studies, ordering and review of laboratory studies and ordering and performing treatments and interventions   I assumed direction of  critical care for this patient from another provider in my specialty: no     Care discussed with: admitting provider       Medications Ordered in ED Medications  potassium chloride 10 mEq in 100 mL IVPB (10 mEq Intravenous New Bag/Given 07/16/22 1705)  0.9 %  sodium chloride infusion ( Intravenous New Bag/Given 07/16/22 1704)  metroNIDAZOLE (FLAGYL) IVPB 500 mg (500 mg Intravenous New Bag/Given 07/16/22 1826)  clindamycin (CLEOCIN) IVPB  600 mg (has no administration in time range)  HYDROmorphone (DILAUDID) injection 0.5 mg (has no administration in time range)  SUMAtriptan (IMITREX) tablet 50 mg (has no administration in time range)  atorvastatin (LIPITOR) tablet 20 mg (has no administration in time range)  buPROPion (WELLBUTRIN XL) 24 hr tablet 150 mg (has no administration in time range)  DULoxetine (CYMBALTA) DR capsule 60 mg (has no administration in time range)  hydrOXYzine (ATARAX) tablet 25 mg (has no administration in time range)  QUEtiapine (SEROQUEL) tablet 25 mg (has no administration in time range)  mesalamine (LIALDA) EC tablet 1.2 g (has no administration in time range)  gabapentin (NEURONTIN) capsule 300 mg (has no administration in time range)  topiramate (TOPAMAX) tablet 50 mg (has no administration in time range)  loratadine (CLARITIN) tablet 10 mg (has no administration in time range)  promethazine (PHENERGAN) suppository 50 mg (has no administration in time range)  sodium chloride 0.9 % bolus 1,000 mL (1,000 mLs Intravenous New Bag/Given 07/16/22 1533)  HYDROmorphone (DILAUDID) injection 1 mg (1 mg Intravenous Given 07/16/22 1534)  HYDROmorphone (DILAUDID) injection 1 mg (1 mg Intravenous Given 07/16/22 1639)  iohexol (OMNIPAQUE) 300 MG/ML solution 100 mL (75 mLs Intravenous Contrast Given 07/16/22 1647)  ondansetron (ZOFRAN) injection 4 mg (4 mg Intravenous Given 07/16/22 1733)  prochlorperazine (COMPAZINE) injection 10 mg (10 mg Intravenous Given 07/16/22 1817)    ED Course/  Medical Decision Making/ A&P                             Medical Decision Making Amount and/or Complexity of Data Reviewed Labs: ordered. Radiology: ordered.   Rebekah Silva 52 y.o. presented today for left-sided neck swelling. Working DDx that I considered at this time includes, but not limited to, retropharyngeal abscess, semitubular abscess, Ludwig's angina, PTA, osteomyelitis.  R/o DDx: retropharyngeal abscess, semitubular abscess, Ludwig's angina, PTA, osteomyelitis: These are considered less likely due to history of present illness and physical exam findings  Review of prior external notes: 07/16/2022 office visit  Unique Tests and My Interpretation:  Blood cultures: Pending EKG: Sinus to 95 bpm, prolonged QT 446/561, no blocks or ST elevations noted PT/INR: 13.5/1.0 CBC: Leukocytosis 19.4 Lactic acid: CMP: Hypokalemia 2.9 Serum qualitative pregnancy: Negative UA: unremarkable Lactic acid: 1.1 Chest x-ray: No acute cardiopulmonary changes CT soft tissue neck with contrast: inflammation of the floor of the mouth, nonspecific inflammation of the submandibular region  Discussion with Independent Historian: None  Discussion of Management of Tests:  Jearld Fenton, MD ENT Mia Creek, MD Dentist  Risk: High: hospitalization or escalation of hospital-level care  Risk Stratification Score: None  Staffed with Suezanne Jacquet, MD  Plan: Patient presented for left submandibular swelling. On exam patient was tachycardic and tachypneic on arrival and so sepsis workup was initiated.  Patient did have notable left submandibular swelling that was tender to palpation without skin color changes.  Patient did not show signs of trismus or have any oral abnormalities when evaluated.  Patient able to tolerate secretions.  Labs ordered and CT scan will also be ordered to further evaluate for possible abscess.  Patient be given Dilaudid for pain management.  Patient stable this time.  Patient's labs came  back significant for hypokalemia of 2.9.  Patient is unable to tolerate medication orally and so patient will receive potassium through the IV.  Patient's white count is also elevated at 19.4 along with her platelets.  Patient stated that she is still having  pain after receiving Dilaudid and so patient will receive another dose of Dilaudid for pain management as we await for her to go to the CT scanner.  Patient stable at this time.  Spoke with ENT and they said they would not see patient in the hospital as this is a tooth problem and will need to contact dentist.  CT showed inflammation of the oral floor concerning for Ludwig's angina likely secondary to recent tooth extraction.  Flagyl started as patient has penicillin allergy.  Patient began having episodes of emesis and after discussing patient's QT with [place], MD one dose of Zofran will be ordered.  I spoke to the dentist on-call and he stated patient does not need to see oral surgery but can be admitted with IV antibiotics.  He stated that since swelling is on the left side of the neck and not bilaterally this is not true Ludwig's angina and that could just be reactive from the recent procedure and should get better with time.  In the meantime patient's treatment should focus on symptom management.  At this time patient will be admitted to the hospitalist as she is not tolerating food and fluids orally and will need to be monitored.  Patient continued to have nausea vomiting with Zofran and so now patient will be given Compazine for her symptoms.  Patient stable at this time.  Spoke to the hospitalist and patient will be admitted for symptomatic management.  Jearld Fenton, MD ENT said that he would be eval for consult.  Hospitalist updated of this.  Patient updated of this plan.  Patient stable for admission at this time.         Final Clinical Impression(s) / ED Diagnoses Final diagnoses:  Neck swelling  Hypokalemia    Rx / DC Orders ED  Discharge Orders     None         Remi Deter 07/16/22 1834    Lonell Grandchild, MD 07/16/22 2021

## 2022-07-17 DIAGNOSIS — E876 Hypokalemia: Secondary | ICD-10-CM | POA: Diagnosis present

## 2022-07-17 DIAGNOSIS — Z6841 Body Mass Index (BMI) 40.0 and over, adult: Secondary | ICD-10-CM | POA: Diagnosis not present

## 2022-07-17 DIAGNOSIS — F411 Generalized anxiety disorder: Secondary | ICD-10-CM | POA: Diagnosis present

## 2022-07-17 DIAGNOSIS — F32A Depression, unspecified: Secondary | ICD-10-CM | POA: Diagnosis present

## 2022-07-17 DIAGNOSIS — G629 Polyneuropathy, unspecified: Secondary | ICD-10-CM | POA: Diagnosis present

## 2022-07-17 DIAGNOSIS — K111 Hypertrophy of salivary gland: Secondary | ICD-10-CM | POA: Diagnosis present

## 2022-07-17 DIAGNOSIS — T8141XA Infection following a procedure, superficial incisional surgical site, initial encounter: Secondary | ICD-10-CM | POA: Diagnosis present

## 2022-07-17 DIAGNOSIS — M329 Systemic lupus erythematosus, unspecified: Secondary | ICD-10-CM | POA: Diagnosis present

## 2022-07-17 DIAGNOSIS — D509 Iron deficiency anemia, unspecified: Secondary | ICD-10-CM | POA: Diagnosis present

## 2022-07-17 DIAGNOSIS — L03211 Cellulitis of face: Secondary | ICD-10-CM | POA: Diagnosis present

## 2022-07-17 DIAGNOSIS — K5190 Ulcerative colitis, unspecified, without complications: Secondary | ICD-10-CM | POA: Diagnosis present

## 2022-07-17 DIAGNOSIS — K219 Gastro-esophageal reflux disease without esophagitis: Secondary | ICD-10-CM | POA: Diagnosis present

## 2022-07-17 DIAGNOSIS — G43909 Migraine, unspecified, not intractable, without status migrainosus: Secondary | ICD-10-CM | POA: Diagnosis present

## 2022-07-17 DIAGNOSIS — L0211 Cutaneous abscess of neck: Secondary | ICD-10-CM | POA: Diagnosis present

## 2022-07-17 DIAGNOSIS — E871 Hypo-osmolality and hyponatremia: Secondary | ICD-10-CM | POA: Diagnosis present

## 2022-07-17 DIAGNOSIS — K122 Cellulitis and abscess of mouth: Secondary | ICD-10-CM | POA: Diagnosis not present

## 2022-07-17 DIAGNOSIS — E7800 Pure hypercholesterolemia, unspecified: Secondary | ICD-10-CM | POA: Diagnosis present

## 2022-07-17 DIAGNOSIS — A419 Sepsis, unspecified organism: Secondary | ICD-10-CM | POA: Diagnosis present

## 2022-07-17 DIAGNOSIS — K047 Periapical abscess without sinus: Secondary | ICD-10-CM | POA: Diagnosis present

## 2022-07-17 DIAGNOSIS — M272 Inflammatory conditions of jaws: Secondary | ICD-10-CM | POA: Diagnosis present

## 2022-07-17 DIAGNOSIS — E739 Lactose intolerance, unspecified: Secondary | ICD-10-CM | POA: Diagnosis present

## 2022-07-17 DIAGNOSIS — R1310 Dysphagia, unspecified: Secondary | ICD-10-CM | POA: Diagnosis present

## 2022-07-17 DIAGNOSIS — Z886 Allergy status to analgesic agent status: Secondary | ICD-10-CM | POA: Diagnosis not present

## 2022-07-17 DIAGNOSIS — R9431 Abnormal electrocardiogram [ECG] [EKG]: Secondary | ICD-10-CM | POA: Diagnosis present

## 2022-07-17 LAB — HIV ANTIBODY (ROUTINE TESTING W REFLEX): HIV Screen 4th Generation wRfx: NONREACTIVE

## 2022-07-17 LAB — CBC
HCT: 30.4 % — ABNORMAL LOW (ref 36.0–46.0)
Hemoglobin: 9.4 g/dL — ABNORMAL LOW (ref 12.0–15.0)
MCH: 23 pg — ABNORMAL LOW (ref 26.0–34.0)
MCHC: 30.9 g/dL (ref 30.0–36.0)
MCV: 74.3 fL — ABNORMAL LOW (ref 80.0–100.0)
Platelets: 459 10*3/uL — ABNORMAL HIGH (ref 150–400)
RBC: 4.09 MIL/uL (ref 3.87–5.11)
RDW: 23.4 % — ABNORMAL HIGH (ref 11.5–15.5)
WBC: 23.9 10*3/uL — ABNORMAL HIGH (ref 4.0–10.5)
nRBC: 0.1 % (ref 0.0–0.2)

## 2022-07-17 LAB — BASIC METABOLIC PANEL
Anion gap: 8 (ref 5–15)
BUN: 7 mg/dL (ref 6–20)
CO2: 21 mmol/L — ABNORMAL LOW (ref 22–32)
Calcium: 8.2 mg/dL — ABNORMAL LOW (ref 8.9–10.3)
Chloride: 105 mmol/L (ref 98–111)
Creatinine, Ser: 0.78 mg/dL (ref 0.44–1.00)
GFR, Estimated: >60 mL/min (ref >60)
Glucose, Bld: 118 mg/dL — ABNORMAL HIGH (ref 70–99)
Potassium: 4 mmol/L (ref 3.5–5.1)
Sodium: 134 mmol/L — ABNORMAL LOW (ref 135–145)

## 2022-07-17 LAB — PHOSPHORUS: Phosphorus: 2.8 mg/dL (ref 2.5–4.6)

## 2022-07-17 LAB — MAGNESIUM: Magnesium: 1.8 mg/dL (ref 1.7–2.4)

## 2022-07-17 MED ORDER — DEXAMETHASONE SODIUM PHOSPHATE 10 MG/ML IJ SOLN
8.0000 mg | Freq: Three times a day (TID) | INTRAMUSCULAR | Status: AC
  Administered 2022-07-17 (×3): 8 mg via INTRAVENOUS
  Filled 2022-07-17 (×3): qty 1

## 2022-07-17 MED ORDER — CYCLOBENZAPRINE HCL 5 MG PO TABS
5.0000 mg | ORAL_TABLET | Freq: Three times a day (TID) | ORAL | Status: AC | PRN
  Administered 2022-07-17 (×3): 5 mg via ORAL
  Filled 2022-07-17 (×3): qty 1

## 2022-07-17 MED ORDER — CLINDAMYCIN PHOSPHATE 300 MG/50ML IV SOLN
300.0000 mg | Freq: Four times a day (QID) | INTRAVENOUS | Status: DC
  Administered 2022-07-17 – 2022-07-18 (×2): 300 mg via INTRAVENOUS
  Filled 2022-07-17 (×3): qty 50

## 2022-07-17 NOTE — Progress Notes (Addendum)
C Hall at pt bedside. Received verbal order to D/C IV potassium chloride.

## 2022-07-17 NOTE — Hospital Course (Signed)
Patient with PMH of ulcerative colitis on Remicade and Lialda, MDD, GAD presents to the hospital with complaints of left jaw pain. Had a root canal originally. Continue to have pain and swelling and therefore underwent tooth extraction 2 days ago, 5/3 outpatient. Had severe swelling of her left jaw with pain unable to open up her mouth and therefore was referred to ED. ED discussed with on-call dentist on 5/5.  Recommended that the patient does not need to see oral surgery. ENT consult appreciated currently no intervention recommended. Due to ongoing complaint of worsening left jaw pain MRI neck ordered.

## 2022-07-17 NOTE — Progress Notes (Signed)
  Transition of Care [facility]) Screening Note   Patient Details  Name: Rebekah Silva Date of Birth: June 18, 1970   Transition of [facility]) CM/SW Contact:    Harriet Masson, RN Phone Number: 07/17/2022, 9:33 AM    Transition of Care Department [facility]) has reviewed patient and no TOC needs have been identified at this time. We will continue to monitor patient advancement through interdisciplinary progression rounds. If new patient transition needs arise, please place a TOC consult.

## 2022-07-17 NOTE — Progress Notes (Signed)
[facility] Progress Note Patient: Rebekah Silva ZOX:[id number] DOB: 04/25/1970 DOA: 07/16/2022  DOS: the patient was seen and examined on 07/17/2022  Brief hospital course: Patient with PMH of ulcerative colitis on Remicade and Lialda, MDD, GAD presents to the hospital with complaints of left jaw pain. Had a root canal originally. Continue to have pain and swelling and therefore underwent tooth extraction 2 days ago, 5/3 outpatient. Had severe swelling of her left jaw with pain unable to open up her mouth and therefore was referred to ED. ED discussed with on-call dentist on 5/5.  Recommended that the patient does not need to see oral surgery. ENT consult appreciated currently no intervention recommended. Assessment and Plan: Oral infection after dental extraction SIRS, sepsis ruled out, present on admission. Met SIRS criteria on admission with tachycardia and tachypnea although currently does not have any typical sepsis physiology. Also leukocytosis. Lactic acid normal. Serum creatinine normal. CT soft tissue neck shows evidence of inflammation on the left floor of the mouth.  Mild enlargement of the submandibular gland.  No separation or abscess seen.  No sign of bone destruction. Treated with IV Decadron. Currently on IV clindamycin. Received multiple rounds of IV fluid boluses. Currently able to open up her mouth.  Swelling improved significantly. Will continue IV antibiotics for now.  Penicillin allergy.  Continue clindamycin.  Ulcerative colitis. On mesalamine and Remicade. At risk for further worsening due to immunosuppression. Monitor closely.  Prolonged QTc. On admission QTc was 561. Will recheck EKG.  Hypokalemia. Causing acute evaluation. Improving.  Acute anemia. Microcytosis Baseline hemoglobin appears to be around 12-13. On admission hemoglobin was 11.7. Dropping down to 9.4 likely dilutional in nature.  No active bleeding reported. Will monitor.   Recheck tomorrow and check iron level studies as well.  Anxiety. On Wellbutrin which I will continue. Also continue Cymbalta. Continue Seroquel.  Continue Topamax.  Continue Atarax.  Continue gabapentin.  HLD. Continue statin.   Subjective: No nausea no vomiting no fever no chills.  Improving oral intake.  Improving pain.  Improving swelling.  Physical Exam: General: in Mild distress, No Rash Cardiovascular: S1 and S2 Present, No Murmur Respiratory: Good respiratory effort, Bilateral Air entry present. No Crackles, No wheezes Abdomen: Bowel Sound present, No tenderness Extremities: No edema Neuro: Alert and oriented x3, no new focal deficit  Data Reviewed: I have Reviewed nursing notes, Vitals, and Lab results. Since last encounter, pertinent lab results CBC and BMP   . I have ordered test including CBC and BMP  .  Disposition: Status is: Inpatient Remains inpatient appropriate because: Requiring IV antibiotics  enoxaparin (LOVENOX) injection 40 mg Start: 07/17/22 1000   Family Communication: No one at bedside Level of care: Med-Surg   Vitals:   07/17/22 0558 07/17/22 0826 07/17/22 0827 07/17/22 1729  BP:   114/61 102/71  Pulse: 96 (!) 109 (!) 109 (!) 110  Resp:   17 16  Temp:   100.1 F (37.8 C) 99.3 F (37.4 C)  TempSrc:   Oral Oral  SpO2: 98% 98% 97% 98%  Weight:      Height:         Author: Lynden Oxford, MD 07/17/2022 6:22 PM  Please look on [email or web address] to find out who is on call.

## 2022-07-17 NOTE — Consult Note (Signed)
Reason for Consult:tooth infection Referring Physician: Dr Jeraldine Loots Schelly Rebekah is an 52 y.o. Silva.  HPI: she had a root canal and then tooth extraction. I was called to oversee a floor of mouth issue. No abscess on CT scan. She is swollen in the left submandibular area. No breathing issues. No stridor. Some odynophagia  Past Medical History:  Diagnosis Date   Anxiety    Arthritis    ra   Colitis    Depression    GERD (gastroesophageal reflux disease)    Hypercholesterolemia    DR. WHITE   Migraines    PIH (pregnancy induced hypertension) yrs ago, none since   PONV (postoperative nausea and vomiting)    Wears glasses     Past Surgical History:  Procedure Laterality Date   ANAL FISSURE REPAIR N/A 04/09/2015   Procedure: ANAL CHEMICAL DENERVATION(BOTOX);  Surgeon: Axel Filler, MD;  Location: [facility] OR;  Service: General;  Laterality: N/A;   ANAL FISSURE REPAIR  07/06/2016   CATARACT EXTRACTION Left    CESAREAN SECTION  2007   COLONOSCOPY     INTRAUTERINE DEVICE INSERTION  12/15/2005   MOUTH SURGERY     RECTAL EXAM UNDER ANESTHESIA N/A 02/17/2015   Procedure: RECTAL EXAM UNDER ANESTHESIA;  Surgeon: Axel Filler, MD;  Location: [facility] ORS;  Service: General;  Laterality: N/A;   RETINAL DETACHMENT SURGERY  2019   X 5    SPHINCTEROTOMY N/A 02/17/2015   Procedure: LATERAL INTERNAL SPHINCTEROTOMY;  Surgeon: Axel Filler, MD;  Location: [facility] ORS;  Service: General;  Laterality: N/A;    Family History  Problem Relation Age of Onset   Anxiety disorder Mother    Depression Mother    Breast cancer Mother        mastectomy- all through the ducts of left breast    Hypertension Father    Breast cancer Maternal Aunt    Cancer Maternal Grandfather        colon?   Diabetes Paternal Grandmother    Heart disease Paternal Grandmother     Social History:  reports that [employer]. Donnan has never smoked. Joaquin Courts Cadena has never used smokeless tobacco. Joaquin Courts. Sagona reports  current alcohol use. Joaquin Courts Hancock reports current drug use. Drug: Marijuana.  Allergies:  Allergies  Allergen Reactions   Aspirin Other (See Comments)    Ulcerative colitis, abdominal pain   Ceftin [Cefuroxime] Other (See Comments)    Diarrhea, abdominal cramping   Codeine Other (See Comments)    Severe headaches   Lactose Other (See Comments)    Dairy - sets off my colitis   Other Rash and Other (See Comments)    Contact metal agents    Penicillins Rash and Other (See Comments)    Has patient had a PCN reaction causing immediate rash, facial/tongue/throat swelling, SOB or lightheadedness with hypotension: unknown Has patient had a PCN reaction causing severe rash involving mucus membranes or skin necrosis: unknown Has patient had a PCN reaction that required hospitalization unknown Has patient had a PCN reaction occurring within the last 10 years: unknown If all of the above answers are "NO", then may proceed with Cephalosporin use.     Medications: I have reviewed the patient's current medications.  Results for orders placed or performed during the hospital encounter of 07/16/22 (from the past 48 hour(s))  Comprehensive metabolic panel     Status: Abnormal   Collection Time: 07/16/22  3:25 PM  Result Value Ref Range   Sodium  137 135 - 145 mmol/L   Potassium 2.9 (L) 3.5 - 5.1 mmol/L   Chloride 101 98 - 111 mmol/L   CO2 24 22 - 32 mmol/L   Glucose, Bld 100 (H) 70 - 99 mg/dL    Comment: Glucose reference range applies only to samples taken after fasting for at least 8 hours.   BUN 12 6 - 20 mg/dL   Creatinine, Ser 1.61 0.44 - 1.00 mg/dL   Calcium 9.4 8.9 - 09.6 mg/dL   Total Protein 8.4 (H) 6.5 - 8.1 g/dL   Albumin 4.2 3.5 - 5.0 g/dL   AST 14 (L) 15 - 41 U/L   ALT 17 0 - 44 U/L   Alkaline Phosphatase 97 38 - 126 U/L   Total Bilirubin 0.6 0.3 - 1.2 mg/dL   GFR, Estimated >04 >54 mL/min    Comment: (NOTE) Calculated using the CKD-EPI Creatinine Equation (2021)     Anion gap 12 5 - 15    Comment: Performed at [facility], [address] [place], [place] [address]  Lactic acid, plasma     Status: None   Collection Time: 07/16/22  3:25 PM  Result Value Ref Range   Lactic Acid, Venous 1.3 0.5 - 1.9 mmol/L    Comment: Performed at [facility], [address] [place], [place] [address]  CBC with Differential     Status: Abnormal   Collection Time: 07/16/22  3:25 PM  Result Value Ref Range   WBC 19.4 (H) 4.0 - 10.5 K/uL   RBC 5.13 (H) 3.87 - 5.11 MIL/uL   Hemoglobin 11.7 (L) 12.0 - 15.0 g/dL   HCT 29.5 62.1 - 30.8 %   MCV 73.7 (L) 80.0 - 100.0 fL   MCH 22.8 (L) 26.0 - 34.0 pg   MCHC 31.0 30.0 - 36.0 g/dL   RDW 65.7 (H) 84.6 - 96.2 %   Platelets 572 (H) 150 - 400 K/uL   nRBC 0.1 0.0 - 0.2 %   Neutrophils Relative % 74 %   Neutro Abs 14.4 (H) 1.7 - 7.7 K/uL   Lymphocytes Relative 15 %   Lymphs Abs 2.9 0.7 - 4.0 K/uL   Monocytes Relative 9 %   Monocytes Absolute 1.7 (H) 0.1 - 1.0 K/uL   Eosinophils Relative 1 %   Eosinophils Absolute 0.2 0.0 - 0.5 K/uL   Basophils Relative 0 %   Basophils Absolute 0.1 0.0 - 0.1 K/uL   Immature Granulocytes 1 %   Abs Immature Granulocytes 0.10 (H) 0.00 - 0.07 K/uL    Comment: Performed at [facility], [address] [place], [place] [address]  Protime-INR     Status: None   Collection Time: 07/16/22  3:25 PM  Result Value Ref Range   Prothrombin Time 13.5 11.4 - 15.2 seconds   INR 1.0 0.8 - 1.2    Comment: (NOTE) INR goal varies based on device and disease states. Performed at [facility], [address] [place], [place] [address]   Lactic acid, plasma     Status: None   Collection Time: 07/16/22  3:39 PM  Result Value Ref Range   Lactic Acid, Venous 1.1 0.5 - 1.9 mmol/L    Comment: Performed at [facility], [address], [place], [place] [address]  Pregnancy serum, qualitative     Status: None    Collection Time: 07/16/22  3:39 PM  Result Value Ref Range   Preg, Serum NEGATIVE NEGATIVE    Comment:  THE SENSITIVITY OF THIS METHODOLOGY IS >10 mIU/mL. Performed at [facility], [address] [place], [place] [address]   Urinalysis, w/ Reflex to Culture (Infection Suspected) -Urine, Clean Catch     Status: Abnormal   Collection Time: 07/16/22  5:39 PM  Result Value Ref Range   Specimen Source URINE, CATHETERIZED    Color, Urine YELLOW YELLOW   APPearance CLEAR CLEAR   Specific Gravity, Urine >1.046 (H) 1.005 - 1.030   pH 6.0 5.0 - 8.0   Glucose, UA NEGATIVE NEGATIVE mg/dL   Hgb urine dipstick NEGATIVE NEGATIVE   Bilirubin Urine NEGATIVE NEGATIVE   Ketones, ur NEGATIVE NEGATIVE mg/dL   Protein, ur TRACE (A) NEGATIVE mg/dL   Nitrite NEGATIVE NEGATIVE   Leukocytes,Ua NEGATIVE NEGATIVE   RBC / HPF 0-5 0 - 5 RBC/hpf   WBC, UA 0-5 0 - 5 WBC/hpf    Comment:        Reflex urine culture not performed if WBC <=10, OR if Squamous epithelial cells >5. If Squamous epithelial cells >5 suggest recollection.    Bacteria, UA NONE SEEN NONE SEEN   Squamous Epithelial / HPF 0-5 0 - 5 /HPF   Mucus PRESENT     Comment: Performed at [facility], [address], [place], [place] [address]  CBC     Status: Abnormal   Collection Time: 07/17/22  2:33 AM  Result Value Ref Range   WBC 23.9 (H) 4.0 - 10.5 K/uL   RBC 4.09 3.87 - 5.11 MIL/uL   Hemoglobin 9.4 (L) 12.0 - 15.0 g/dL   HCT 08.6 (L) 57.8 - 46.9 %   MCV 74.3 (L) 80.0 - 100.0 fL   MCH 23.0 (L) 26.0 - 34.0 pg   MCHC 30.9 30.0 - 36.0 g/dL   RDW 62.9 (H) 52.8 - 41.3 %   Platelets 459 (H) 150 - 400 K/uL   nRBC 0.1 0.0 - 0.2 %    Comment: Performed at [facility], [address], [place], [place] [address]  Basic metabolic panel     Status: Abnormal   Collection Time: 07/17/22  2:33 AM  Result Value Ref Range   Sodium 134 (L) 135 - 145 mmol/L   Potassium 4.0 3.5 - 5.1 mmol/L    Chloride 105 98 - 111 mmol/L   CO2 21 (L) 22 - 32 mmol/L   Glucose, Bld 118 (H) 70 - 99 mg/dL    Comment: Glucose reference range applies only to samples taken after fasting for at least 8 hours.   BUN 7 6 - 20 mg/dL   Creatinine, Ser 0.27 0.44 - 1.00 mg/dL   Calcium 8.2 (L) 8.9 - 10.3 mg/dL   GFR, Estimated >25 >36 mL/min    Comment: (NOTE) Calculated using the CKD-EPI Creatinine Equation (2021)    Anion gap 8 5 - 15    Comment: Performed at [facility], [address], [place], [place] [address]  Magnesium     Status: None   Collection Time: 07/17/22  2:33 AM  Result Value Ref Range   Magnesium 1.8 1.7 - 2.4 mg/dL    Comment: Performed at [facility], [address], [place], [place] [address]  Phosphorus     Status: None   Collection Time: 07/17/22  2:33 AM  Result Value Ref Range   Phosphorus 2.8 2.5 - 4.6 mg/dL    Comment: Performed at [facility], [address], [place], [place] [address]  HIV  Antibody (routine testing w rflx)     Status: None   Collection Time: 07/17/22  2:33 AM  Result Value Ref Range   HIV Screen 4th Generation wRfx Non Reactive Non Reactive    Comment: Performed at [facility], [address] [place], [place] [address]    CT Soft Tissue Neck W Contrast  Result Date: 07/16/2022 CLINICAL DATA:  Swelling of the left side of the neck following tooth extraction 2 weeks ago. EXAM: CT NECK WITH CONTRAST TECHNIQUE: Multidetector CT imaging of the neck was performed using the standard protocol following the bolus administration of intravenous contrast. RADIATION DOSE REDUCTION: This exam was performed according to the departmental dose-optimization program which includes automated exposure control, adjustment of the mA and/or kV according to patient size and/or use of iterative reconstruction technique. CONTRAST:  75mL OMNIPAQUE IOHEXOL 300 MG/ML  SOLN COMPARISON:  None Available. FINDINGS: Pharynx and larynx: No mucosal lesion. There are  inflammatory changes at the floor of the mouth on the left adjacent to the medial aspect of the body of the mandible consistent with infectious inflammatory inflammation. This is presumably subsequent to the recent extraction of tooth 18. No sign of bone destruction however. There is mild inflammatory enlargement of the adjacent submandibular gland. There is nonspecific inflammatory edema of the soft tissues underneath the chin and of the left side of the lower face and upper neck. Salivary glands: Parotid glands are normal. As above, there is probably some secondary inflammation of the left submandibular gland. Thyroid: Normal Lymph nodes: Reactive level 1 nodal enlargement on the left without suppuration. Vascular: No septic thrombosis.  No arterial disease. Limited intracranial: Normal Visualized orbits: Normal Mastoids and visualized paranasal sinuses: Clear Skeleton: Otherwise no significant skeletal finding. Upper chest: Normal Other: None IMPRESSION: Inflammatory changes at the floor of the mouth on the left adjacent to the medial aspect of the body of the mandible. This is presumably subsequent to the recent extraction of tooth 18. No sign of bone destruction however. There is nonspecific inflammatory edema of the soft tissues underneath the chin and of the left side of the lower face and upper neck. Reactive level 1 nodal enlargement on the left without suppuration. Mild secondary inflammatory enlargement of the left submandibular gland. Electronically Signed   By: Paulina Fusi M.D.   On: 07/16/2022 17:15   DG Chest Port 1 View  Result Date: 07/16/2022 CLINICAL DATA:  Sepsis. EXAM: PORTABLE CHEST 1 VIEW COMPARISON:  None Available. FINDINGS: Clear lungs. Normal heart size and mediastinal contours. No pleural effusion or pneumothorax. Visualized bones and upper abdomen are unremarkable. IMPRESSION: No evidence of acute cardiopulmonary disease. Electronically Signed   By: Orvan Falconer M.D.   On:  07/16/2022 15:11    ROS Blood pressure 114/61, pulse (!) 109, temperature 100.1 F (37.8 C), temperature source Oral, resp. rate 17, height 5\' 7"  (1.702 m), weight 124.3 kg, SpO2 97 %. Physical Exam HENT:     Nose: Nose normal.     Mouth/Throat:     Comments: Tongue and floor of mouth are normal. No trismus. The extraction site is without significant swelling or erythema.  Eyes:     Extraocular Movements: Extraocular movements intact.     Pupils: Pupils are equal, round, and reactive to light.  Neck:     Comments: Swelling in the left submandibular area but no fluctuance. It is tender. There is no erythema of the skin. The swelling  is confirmed to the angle area. Rest  of neck normal Neurological:     Mental Status: Rebekah Silva is alert.       Assessment/Plan: Left tooth infection- she has some submandibular swelling and tenderness. No abscess. The floor of mouth and tongue are normal. She may need change in antibiotic since she already has been on clindamycin for a week. I would call the dentist about care. I do not need to intervene with any of the swelling issues at this time.   Suzanna Obey 07/17/2022, 10:51 AM

## 2022-07-17 NOTE — Progress Notes (Signed)
Pt arrived to floor via [employer] [facility], from [facility]. Pt was oriented to room and use of call bell.  Admission notified of pt arrival.

## 2022-07-18 ENCOUNTER — Inpatient Hospital Stay (HOSPITAL_COMMUNITY): Payer: BC Managed Care – PPO

## 2022-07-18 DIAGNOSIS — K122 Cellulitis and abscess of mouth: Secondary | ICD-10-CM | POA: Diagnosis not present

## 2022-07-18 LAB — VITAMIN B12: Vitamin B-12: 1898 pg/mL — ABNORMAL HIGH (ref 180–914)

## 2022-07-18 LAB — COMPREHENSIVE METABOLIC PANEL
ALT: 18 U/L (ref 0–44)
AST: 16 U/L (ref 15–41)
Albumin: 2.7 g/dL — ABNORMAL LOW (ref 3.5–5.0)
Alkaline Phosphatase: 92 U/L (ref 38–126)
Anion gap: 9 (ref 5–15)
BUN: 8 mg/dL (ref 6–20)
CO2: 19 mmol/L — ABNORMAL LOW (ref 22–32)
Calcium: 8.8 mg/dL — ABNORMAL LOW (ref 8.9–10.3)
Chloride: 108 mmol/L (ref 98–111)
Creatinine, Ser: 0.73 mg/dL (ref 0.44–1.00)
GFR, Estimated: >60 mL/min (ref >60)
Glucose, Bld: 140 mg/dL — ABNORMAL HIGH (ref 70–99)
Potassium: 3.8 mmol/L (ref 3.5–5.1)
Sodium: 136 mmol/L (ref 135–145)
Total Bilirubin: 0.4 mg/dL (ref 0.3–1.2)
Total Protein: 7 g/dL (ref 6.5–8.1)

## 2022-07-18 LAB — CBC WITH DIFFERENTIAL/PLATELET
Abs Immature Granulocytes: 0.29 10*3/uL — ABNORMAL HIGH (ref 0.00–0.07)
Basophils Absolute: 0.1 10*3/uL (ref 0.0–0.1)
Basophils Relative: 0 %
Eosinophils Absolute: 0 10*3/uL (ref 0.0–0.5)
Eosinophils Relative: 0 %
HCT: 30.6 % — ABNORMAL LOW (ref 36.0–46.0)
Hemoglobin: 9.8 g/dL — ABNORMAL LOW (ref 12.0–15.0)
Immature Granulocytes: 1 %
Lymphocytes Relative: 5 %
Lymphs Abs: 1.5 10*3/uL (ref 0.7–4.0)
MCH: 23.4 pg — ABNORMAL LOW (ref 26.0–34.0)
MCHC: 32 g/dL (ref 30.0–36.0)
MCV: 73 fL — ABNORMAL LOW (ref 80.0–100.0)
Monocytes Absolute: 0.7 10*3/uL (ref 0.1–1.0)
Monocytes Relative: 3 %
Neutro Abs: 24.4 10*3/uL — ABNORMAL HIGH (ref 1.7–7.7)
Neutrophils Relative %: 91 %
Platelets: 531 10*3/uL — ABNORMAL HIGH (ref 150–400)
RBC: 4.19 MIL/uL (ref 3.87–5.11)
RDW: 23.3 % — ABNORMAL HIGH (ref 11.5–15.5)
WBC: 26.9 10*3/uL — ABNORMAL HIGH (ref 4.0–10.5)
nRBC: 0 % (ref 0.0–0.2)

## 2022-07-18 LAB — IRON AND TIBC
Iron: 10 ug/dL — ABNORMAL LOW (ref 28–170)
Saturation Ratios: 3 % — ABNORMAL LOW (ref 10.4–31.8)
TIBC: 298 ug/dL (ref 250–450)
UIBC: 288 ug/dL

## 2022-07-18 LAB — RETICULOCYTES
Immature Retic Fract: 35.5 % — ABNORMAL HIGH (ref 2.3–15.9)
RBC.: 4.18 MIL/uL (ref 3.87–5.11)
Retic Count, Absolute: 104.5 10*3/uL (ref 19.0–186.0)
Retic Ct Pct: 2.5 % (ref 0.4–3.1)

## 2022-07-18 LAB — MAGNESIUM: Magnesium: 2 mg/dL (ref 1.7–2.4)

## 2022-07-18 LAB — FERRITIN: Ferritin: 16 ng/mL (ref 11–307)

## 2022-07-18 MED ORDER — MENTHOL 3 MG MT LOZG
1.0000 | LOZENGE | OROMUCOSAL | Status: DC | PRN
  Administered 2022-07-18: 3 mg via ORAL
  Filled 2022-07-18: qty 9

## 2022-07-18 MED ORDER — FERROUS SULFATE 325 (65 FE) MG PO TABS
325.0000 mg | ORAL_TABLET | Freq: Every day | ORAL | Status: DC
  Administered 2022-07-18 – 2022-07-27 (×10): 325 mg via ORAL
  Filled 2022-07-18 (×10): qty 1

## 2022-07-18 MED ORDER — ONDANSETRON HCL 4 MG/2ML IJ SOLN: 4.0000 mg | Freq: Four times a day (QID) | INTRAMUSCULAR | Status: DC | PRN

## 2022-07-18 MED ORDER — GADOBUTROL 1 MMOL/ML IV SOLN
10.0000 mL | Freq: Once | INTRAVENOUS | Status: AC | PRN
  Administered 2022-07-18: 10 mL via INTRAVENOUS

## 2022-07-18 MED ORDER — PHENOL 1.4 % MT LIQD
1.0000 | OROMUCOSAL | Status: DC | PRN
  Administered 2022-07-18: 1 via OROMUCOSAL
  Filled 2022-07-18: qty 177

## 2022-07-18 MED ORDER — CYCLOBENZAPRINE HCL 5 MG PO TABS
5.0000 mg | ORAL_TABLET | Freq: Three times a day (TID) | ORAL | Status: DC | PRN
  Administered 2022-07-18 – 2022-07-26 (×15): 5 mg via ORAL
  Filled 2022-07-18 (×15): qty 1

## 2022-07-18 MED ORDER — PROCHLORPERAZINE EDISYLATE 10 MG/2ML IJ SOLN: 5.0000 mg | Freq: Four times a day (QID) | INTRAMUSCULAR | Status: DC | PRN

## 2022-07-18 MED ORDER — METRONIDAZOLE 500 MG/100ML IV SOLN
500.0000 mg | Freq: Two times a day (BID) | INTRAVENOUS | Status: DC
  Administered 2022-07-18 – 2022-07-24 (×13): 500 mg via INTRAVENOUS
  Filled 2022-07-18 (×13): qty 100

## 2022-07-18 MED ORDER — SODIUM CHLORIDE 0.9 % IV SOLN
2.0000 g | INTRAVENOUS | Status: DC
  Administered 2022-07-18 – 2022-07-23 (×5): 2 g via INTRAVENOUS
  Filled 2022-07-18 (×7): qty 20

## 2022-07-18 MED ORDER — BENZOCAINE 10 % MT GEL
Freq: Four times a day (QID) | OROMUCOSAL | Status: DC | PRN
  Filled 2022-07-18: qty 9

## 2022-07-18 NOTE — Progress Notes (Signed)
[facility] Progress Note Patient: Rebekah Silva WUJ:[id number] DOB: 05-11-1970 DOA: 07/16/2022  DOS: the patient was seen and examined on 07/18/2022  Brief hospital course: Patient with PMH of ulcerative colitis on Remicade and Lialda, MDD, GAD presents to the hospital with complaints of left jaw pain. Had a root canal originally. Continue to have pain and swelling and therefore underwent tooth extraction 2 days ago, 5/3 outpatient. Had severe swelling of her left jaw with pain unable to open up her mouth and therefore was referred to ED. ED discussed with on-call dentist on 5/5.  Recommended that the patient does not need to see oral surgery. ENT consult appreciated currently no intervention recommended. Due to ongoing complaint of worsening left jaw pain MRI neck ordered. Assessment and Plan: Oral infection after dental extraction SIRS, sepsis ruled out, present on admission. Met SIRS criteria on admission with tachycardia and tachypnea although currently does not have any typical sepsis physiology. Also leukocytosis. Lactic acid normal. Serum creatinine normal. CT soft tissue neck shows evidence of inflammation on the left floor of the mouth.  Mild enlargement of the submandibular gland.  No separation or abscess seen.  No sign of bone destruction. EDP discussed with Dr. Mia Creek from dentistry who recommended no intervention necessary. Appreciate Dr. Jearld Fenton from ENT will admit patient.  No concerns for Ludwig's angina.  No intervention necessary. Treated with IV Decadron. Currently on IV clindamycin. Received multiple rounds of IV fluid boluses. Currently able to open up her mouth.  Swelling improved significantly on 5/6.  But on 5/7 patient reports worsening swelling as well as worsening pain.  Also reports purulent cough.  While clinically appears to have more erythema involving overall face. Will switch IV clindamycin to ceftriaxone and Flagyl.  Requesting MRI neck with and  without contrast to identify abscess.  Ulcerative colitis. On mesalamine and Remicade. At risk for further worsening due to immunosuppression. Monitor closely.  Prolonged QTc. On admission QTc was 561.  Repeat EKG shows resolution of prolonged QTc.  Hypokalemia. Resolved.  Acute anemia. Microcytosis secondary to iron deficiency Baseline hemoglobin appears to be around 12-13. On admission hemoglobin was 11.7. Dropping down to 9s likely dilutional in nature.  No active bleeding reported. Will treat with oral iron replacement.  Anxiety. On Wellbutrin which I will continue. Also continue Cymbalta. Continue Seroquel.  Continue Topamax.  Continue Atarax.  Continue gabapentin.  HLD. Continue statin.  Class III obesity Body mass index is 42.91 kg/m.  Placing the patient at high risk of poor outcome.   Subjective: Appears anxious.  Reports more pain.  Reports more swelling.  No nausea no vomiting.  No chest pain.  Physical Exam: General: in Mild distress, No Rash, no significant swelling or redness or discharge noted on oral examination.  Erythema of the face.  Swelling appears to be somewhat worsening compared to yesterday on the left angle of the jaw. Cardiovascular: S1 and S2 Present, No Murmur Respiratory: Good respiratory effort, Bilateral Air entry present. No Crackles, No wheezes Abdomen: Bowel Sound present, No tenderness Extremities: No edema Neuro: Alert and oriented x3, no new focal deficit   Data Reviewed: I have Reviewed nursing notes, Vitals, and Lab results. Since last encounter, pertinent lab results CBC and BMP   . I have ordered test including CBC and BMP  . I have ordered imaging MRI neck with and without contrast  .   Disposition: Status is: Inpatient Remains inpatient appropriate because: Requiring IV antibiotics  enoxaparin (LOVENOX) injection 40 mg Start:  07/17/22 1000   Family Communication: No one at bedside Level of care: Med-Surg   Vitals:    07/17/22 2030 07/18/22 0628 07/18/22 0746 07/18/22 1612  BP: 126/88 (!) 125/90 (!) 144/85 139/85  Pulse: (!) 106 95 (!) 113 95  Resp: 16 18 17 18   Temp: 99.4 F (37.4 C) 98.2 F (36.8 C) 98.1 F (36.7 C) 97.8 F (36.6 C)  TempSrc: Oral Oral Oral Oral  SpO2: 99% 99% 97% 100%  Weight:      Height:         Author: Lynden Oxford, MD 07/18/2022 6:33 PM  Please look on [email or web address] to find out who is on call.

## 2022-07-19 ENCOUNTER — Inpatient Hospital Stay (HOSPITAL_COMMUNITY): Payer: BC Managed Care – PPO | Admitting: Anesthesiology

## 2022-07-19 ENCOUNTER — Other Ambulatory Visit: Payer: Self-pay

## 2022-07-19 ENCOUNTER — Encounter (HOSPITAL_COMMUNITY): Payer: Self-pay | Admitting: Internal Medicine

## 2022-07-19 ENCOUNTER — Encounter (HOSPITAL_COMMUNITY)
Admission: EM | Disposition: A | Discharge status: Home Health | Payer: Self-pay | Source: Home / Self Care | Attending: Family Medicine

## 2022-07-19 DIAGNOSIS — K122 Cellulitis and abscess of mouth: Secondary | ICD-10-CM | POA: Diagnosis not present

## 2022-07-19 HISTORY — PX: INCISION AND DRAINAGE ABSCESS: SHX5864

## 2022-07-19 LAB — PROCALCITONIN: Procalcitonin: <0.1 ng/mL

## 2022-07-19 LAB — BASIC METABOLIC PANEL
Anion gap: 9 (ref 5–15)
BUN: 8 mg/dL (ref 6–20)
CO2: 24 mmol/L (ref 22–32)
Calcium: 8 mg/dL — ABNORMAL LOW (ref 8.9–10.3)
Chloride: 100 mmol/L (ref 98–111)
Creatinine, Ser: 0.86 mg/dL (ref 0.44–1.00)
GFR, Estimated: >60 mL/min (ref >60)
Glucose, Bld: 105 mg/dL — ABNORMAL HIGH (ref 70–99)
Potassium: 3 mmol/L — ABNORMAL LOW (ref 3.5–5.1)
Sodium: 133 mmol/L — ABNORMAL LOW (ref 135–145)

## 2022-07-19 LAB — CBC WITH DIFFERENTIAL/PLATELET
Abs Immature Granulocytes: 0 10*3/uL (ref 0.00–0.07)
Basophils Absolute: 0 10*3/uL (ref 0.0–0.1)
Basophils Relative: 0 %
Eosinophils Absolute: 0 10*3/uL (ref 0.0–0.5)
Eosinophils Relative: 0 %
HCT: 28 % — ABNORMAL LOW (ref 36.0–46.0)
Hemoglobin: 9.1 g/dL — ABNORMAL LOW (ref 12.0–15.0)
Lymphocytes Relative: 22 %
Lymphs Abs: 4.4 10*3/uL — ABNORMAL HIGH (ref 0.7–4.0)
MCH: 23.3 pg — ABNORMAL LOW (ref 26.0–34.0)
MCHC: 32.5 g/dL (ref 30.0–36.0)
MCV: 71.6 fL — ABNORMAL LOW (ref 80.0–100.0)
Monocytes Absolute: 1.6 10*3/uL — ABNORMAL HIGH (ref 0.1–1.0)
Monocytes Relative: 8 %
Neutro Abs: 14.1 10*3/uL — ABNORMAL HIGH (ref 1.7–7.7)
Neutrophils Relative %: 70 %
Platelets: 492 10*3/uL — ABNORMAL HIGH (ref 150–400)
RBC: 3.91 MIL/uL (ref 3.87–5.11)
RDW: 23.4 % — ABNORMAL HIGH (ref 11.5–15.5)
WBC: 20.2 10*3/uL — ABNORMAL HIGH (ref 4.0–10.5)
nRBC: 0 /100 WBC
nRBC: 0.2 % (ref 0.0–0.2)

## 2022-07-19 LAB — MAGNESIUM: Magnesium: 1.9 mg/dL (ref 1.7–2.4)

## 2022-07-19 LAB — AEROBIC/ANAEROBIC CULTURE W GRAM STAIN (SURGICAL/DEEP WOUND)

## 2022-07-19 SURGERY — INCISION AND DRAINAGE, ABSCESS
Anesthesia: General | Laterality: Left

## 2022-07-19 MED ORDER — LACTATED RINGERS IV SOLN: INTRAVENOUS | Status: DC

## 2022-07-19 MED ORDER — PROPOFOL 10 MG/ML IV BOLUS
INTRAVENOUS | Status: DC | PRN
  Administered 2022-07-19: 200 mg via INTRAVENOUS

## 2022-07-19 MED ORDER — ACETAMINOPHEN 10 MG/ML IV SOLN: 1000.0000 mg | Freq: Once | INTRAVENOUS | Status: DC | PRN

## 2022-07-19 MED ORDER — CHLORHEXIDINE GLUCONATE 0.12 % MT SOLN
OROMUCOSAL | Status: AC
  Filled 2022-07-19: qty 15

## 2022-07-19 MED ORDER — CHLORHEXIDINE GLUCONATE 0.12 % MT SOLN: 15.0000 mL | Freq: Once | OROMUCOSAL | Status: DC

## 2022-07-19 MED ORDER — PROMETHAZINE HCL 25 MG/ML IJ SOLN: 6.2500 mg | INTRAMUSCULAR | Status: DC | PRN

## 2022-07-19 MED ORDER — OXYCODONE HCL 5 MG/5ML PO SOLN: 5.0000 mg | Freq: Once | ORAL | Status: DC | PRN

## 2022-07-19 MED ORDER — PROPOFOL 10 MG/ML IV BOLUS
INTRAVENOUS | Status: AC
  Filled 2022-07-19: qty 20

## 2022-07-19 MED ORDER — SUCCINYLCHOLINE CHLORIDE 200 MG/10ML IV SOSY
PREFILLED_SYRINGE | INTRAVENOUS | Status: DC | PRN
  Administered 2022-07-19: 120 mg via INTRAVENOUS

## 2022-07-19 MED ORDER — MIDAZOLAM HCL 5 MG/5ML IJ SOLN
INTRAMUSCULAR | Status: DC | PRN
  Administered 2022-07-19: 2 mg via INTRAVENOUS

## 2022-07-19 MED ORDER — AMISULPRIDE (ANTIEMETIC) 5 MG/2ML IV SOLN
INTRAVENOUS | Status: AC
  Filled 2022-07-19: qty 4

## 2022-07-19 MED ORDER — FENTANYL CITRATE (PF) 100 MCG/2ML IJ SOLN: 25.0000 ug | INTRAMUSCULAR | Status: DC | PRN

## 2022-07-19 MED ORDER — DEXMEDETOMIDINE HCL IN NACL 80 MCG/20ML IV SOLN
INTRAVENOUS | Status: DC | PRN
  Administered 2022-07-19: 12 ug via INTRAVENOUS
  Administered 2022-07-19: 8 ug via INTRAVENOUS

## 2022-07-19 MED ORDER — OXYCODONE HCL 5 MG PO TABS: 5.0000 mg | ORAL_TABLET | Freq: Once | ORAL | Status: DC | PRN

## 2022-07-19 MED ORDER — DEXAMETHASONE SODIUM PHOSPHATE 4 MG/ML IJ SOLN
INTRAMUSCULAR | Status: DC | PRN
  Administered 2022-07-19: 10 mg via INTRAVENOUS

## 2022-07-19 MED ORDER — LIDOCAINE-EPINEPHRINE (PF) 1 %-1:200000 IJ SOLN
INTRAMUSCULAR | Status: AC
  Filled 2022-07-19: qty 30

## 2022-07-19 MED ORDER — POTASSIUM CHLORIDE 20 MEQ PO PACK
40.0000 meq | PACK | ORAL | Status: AC
  Administered 2022-07-19 (×2): 40 meq via ORAL
  Filled 2022-07-19 (×2): qty 2

## 2022-07-19 MED ORDER — FENTANYL CITRATE (PF) 250 MCG/5ML IJ SOLN
INTRAMUSCULAR | Status: AC
  Filled 2022-07-19: qty 5

## 2022-07-19 MED ORDER — SUGAMMADEX SODIUM 200 MG/2ML IV SOLN
INTRAVENOUS | Status: DC | PRN
  Administered 2022-07-19: 200 mg via INTRAVENOUS

## 2022-07-19 MED ORDER — ONDANSETRON HCL 4 MG/2ML IJ SOLN
INTRAMUSCULAR | Status: DC | PRN
  Administered 2022-07-19: 4 mg via INTRAVENOUS

## 2022-07-19 MED ORDER — LIDOCAINE 2% (20 MG/ML) 5 ML SYRINGE
INTRAMUSCULAR | Status: DC | PRN
  Administered 2022-07-19: 60 mg via INTRAVENOUS

## 2022-07-19 MED ORDER — ORAL CARE MOUTH RINSE: 15.0000 mL | Freq: Once | OROMUCOSAL | Status: DC

## 2022-07-19 MED ORDER — MIDAZOLAM HCL 2 MG/2ML IJ SOLN
INTRAMUSCULAR | Status: AC
  Filled 2022-07-19: qty 2

## 2022-07-19 MED ORDER — FENTANYL CITRATE (PF) 100 MCG/2ML IJ SOLN
INTRAMUSCULAR | Status: DC | PRN
  Administered 2022-07-19 (×2): 50 ug via INTRAVENOUS
  Administered 2022-07-19: 100 ug via INTRAVENOUS
  Administered 2022-07-19: 50 ug via INTRAVENOUS

## 2022-07-19 MED ORDER — ROCURONIUM BROMIDE 10 MG/ML (PF) SYRINGE
PREFILLED_SYRINGE | INTRAVENOUS | Status: DC | PRN
  Administered 2022-07-19: 50 mg via INTRAVENOUS

## 2022-07-19 MED ORDER — LIDOCAINE-EPINEPHRINE 1 %-1:100000 IJ SOLN
INTRAMUSCULAR | Status: AC
  Filled 2022-07-19: qty 1

## 2022-07-19 MED ORDER — AMISULPRIDE (ANTIEMETIC) 5 MG/2ML IV SOLN
10.0000 mg | Freq: Once | INTRAVENOUS | Status: AC | PRN
  Administered 2022-07-19: 10 mg via INTRAVENOUS

## 2022-07-19 SURGICAL SUPPLY — 45 items
ATTRACTOMAT 16X20 MAGNETIC DRP (DRAPES) IMPLANT
BAG COUNTER SPONGE SURGICOUNT (BAG) ×1 IMPLANT
BAG SPNG CNTER NS LX DISP (BAG)
BLADE SURG 15 STRL LF DISP TIS (BLADE) IMPLANT
BLADE SURG 15 STRL SS (BLADE) ×1
CANISTER SUCT 3000ML PPV (MISCELLANEOUS) ×1 IMPLANT
CLEANER TIP ELECTROSURG 2X2 (MISCELLANEOUS) ×1 IMPLANT
CNTNR URN SCR LID CUP LEK RST (MISCELLANEOUS) ×1 IMPLANT
CONT SPEC 4OZ STRL OR WHT (MISCELLANEOUS) ×1
COVER SURGICAL LIGHT HANDLE (MISCELLANEOUS) IMPLANT
DRAIN CHANNEL 15F RND FF W/TCR (WOUND CARE) IMPLANT
DRAIN PENROSE .5X12 LATEX STL (DRAIN) IMPLANT
DRAPE HALF SHEET 40X57 (DRAPES) IMPLANT
DRAPE INCISE 13X13 STRL (DRAPES) IMPLANT
ELECT COATED BLADE 2.86 ST (ELECTRODE) ×1 IMPLANT
ELECT REM PT RETURN 9FT ADLT (ELECTROSURGICAL) ×1
ELECTRODE REM PT RTRN 9FT ADLT (ELECTROSURGICAL) ×1 IMPLANT
EVACUATOR SILICONE 100CC (DRAIN) IMPLANT
GAUZE 4X4 16PLY ~~LOC~~+RFID DBL (SPONGE) ×1 IMPLANT
GAUZE SPONGE 4X4 12PLY STRL (GAUZE/BANDAGES/DRESSINGS) ×1 IMPLANT
GLOVE SS BIOGEL STRL SZ 7.5 (GLOVE) ×1 IMPLANT
GOWN STRL REUS W/ TWL LRG LVL3 (GOWN DISPOSABLE) ×2 IMPLANT
GOWN STRL REUS W/TWL LRG LVL3 (GOWN DISPOSABLE) ×2
KIT BASIN OR (CUSTOM PROCEDURE TRAY) ×1 IMPLANT
KIT TURNOVER KIT B (KITS) ×1 IMPLANT
NDL HYPO 25GX1X1/2 BEV (NEEDLE) IMPLANT
NEEDLE HYPO 25GX1X1/2 BEV (NEEDLE) IMPLANT
NS IRRIG 1000ML POUR BTL (IV SOLUTION) ×1 IMPLANT
PAD ARMBOARD 7.5X6 YLW CONV (MISCELLANEOUS) ×2 IMPLANT
PENCIL FOOT CONTROL (ELECTRODE) ×1 IMPLANT
POSITIONER HEAD DONUT 9IN (MISCELLANEOUS) IMPLANT
SOL PREP POV-IOD 4OZ 10% (MISCELLANEOUS) IMPLANT
SPONGE INTESTINAL PEANUT (DISPOSABLE) IMPLANT
STAPLER VISISTAT 35W (STAPLE) ×1 IMPLANT
SUCTION FRAZIER TIP 8 FR DISP (SUCTIONS)
SUCTION TUBE FRAZIER 8FR DISP (SUCTIONS) IMPLANT
SUT CHROMIC 4 0 PS 2 18 (SUTURE) IMPLANT
SUT ETHILON 3 0 PS 1 (SUTURE) IMPLANT
SUT NYLON ETHILON 5-0 P-3 1X18 (SUTURE) IMPLANT
SUT SILK 2 0 PERMA HAND 18 BK (SUTURE) IMPLANT
TAPE CLOTH SURG 4X10 WHT LF (GAUZE/BANDAGES/DRESSINGS) IMPLANT
TOWEL GREEN STERILE FF (TOWEL DISPOSABLE) ×1 IMPLANT
TRAY ENT MC OR (CUSTOM PROCEDURE TRAY) ×1 IMPLANT
WATER STERILE IRR 1000ML POUR (IV SOLUTION) ×1 IMPLANT
YANKAUER SUCT BULB TIP NO VENT (SUCTIONS) IMPLANT

## 2022-07-19 NOTE — Anesthesia Procedure Notes (Signed)
Procedure Name: Intubation Date/Time: 07/19/2022 12:41 PM  Performed by: Caren Macadam, CRNAPre-anesthesia Checklist: Patient identified, Emergency Drugs available, Suction available and Patient being monitored Patient Re-evaluated:Patient Re-evaluated prior to induction Oxygen Delivery Method: Circle system utilized Preoxygenation: Pre-oxygenation with 100% oxygen Induction Type: IV induction Ventilation: Mask ventilation without difficulty Laryngoscope Size: Glidescope Grade View: Grade I Tube type: Oral Tube size: 6.5 mm Number of attempts: 1 Airway Equipment and Method: Stylet and Video-laryngoscopy Placement Confirmation: ETT inserted through vocal cords under direct vision, positive ETCO2 and breath sounds checked- equal and bilateral Secured at: 23 cm Tube secured with: Tape Dental Injury: Teeth and Oropharynx as per pre-operative assessment

## 2022-07-19 NOTE — Progress Notes (Signed)
Patient ID: Rebekah Silva, female   DOB: Dec 28, 1970, 52 y.o.   MRN: [id number]   Patient is essentially worse and still in significant amount of pain.  An MRI scan was done that now shows that there is a fluid collection along the medial portion of the mandible extending down below the mandible into the submandibular space.  She has significant swelling that is worse in the left angle/submandibular region.  There is skin erythema.  At this point she does need an incision and drainage.  We discussed the options of intraoral incision and drainage versus incision on the neck.  After the pros and cons were discussed she elected to proceed with the external approach.  We discussed the risk to the marginal mandibular nerve.  She is already been numb in the region of the mental nerve since the tooth extraction/root canal.  It is still numb and that left lower lip location.  She does not have any significant floor mouth swelling.  We discussed risk, benefits, and options.  All her questions were answered and consent was obtained.  She ate at 7 or so so we will need to wait for scheduling of the operating room until she is NPO.

## 2022-07-19 NOTE — Op Note (Signed)
Preop/postop diagnosis: Left neck abscess Procedure: Incision and drainage of left neck abscess :  anesthesia General Estimated blood loss: Approximately 25 cc Indications: 52 year old with a tooth extraction and root canal performed several weeks ago.  She has persisted in pain and swelling.  She had a CT scan earlier in the week that did not show an abscess.  She continued to have increased swelling and pain and an MRI scan was obtained.  That did show a collection of fluid in the medial aspect of the mandible and extending anterior and posterior.  She was informed risk and benefits of the procedure and options were discussed all questions were answered and consent was obtained. Procedure: Patient was taken the op room placed supine position after general endotracheal tube anesthesia was placed in the right gaze position prepped and draped in the usual sterile manner.  A incision was made below the level of the mandible and angle approximately 2 cm.  15 blade used to open the skin and then careful dissection with hemostat and cautery was performed to dissect down to the submandibular gland.  With careful visualization and thin fascia dissection the marginal nerve was not seen.  Once down to the submandibular gland blunt finger dissection was used to break into the abscess which was immediately encountered significant pus.  Finger dissection was performed along the medial aspect of the mandible up into the anterior floor mouth and posteriorly to the pterygoid.  Again more purulence was expressed.  The wound was copiously irrigated after taking cultures.  Penrose placed anterior and posterior and secured with a 3-0 nylon.  Patient was awakened brought recovery in stable condition counts correct

## 2022-07-19 NOTE — Progress Notes (Signed)
PROGRESS NOTE    Rebekah Silva  WUJ:[id number] DOB: 30-Aug-1970 DOA: 07/16/2022 PCP: Laurann Montana, MD   Brief Narrative:  Patient with PMH of ulcerative colitis on Remicade and Lialda, MDD, GAD presents to the hospital with complaints of left jaw pain. Had a root canal originally.Continue to have pain and swelling and therefore underwent tooth extraction 2 days ago, 5/3 outpatient. Had severe swelling of her left jaw with pain unable to open up her mouth and therefore was referred to ED. ED discussed with on-call dentist on 5/5.  Recommended that the patient does not need to see oral surgery. ENT consult appreciated currently no intervention recommended. Due to ongoing complaint of worsening left jaw pain MRI neck ordered on 07/18/2022 which showed large area of inflammation at the left floor of mouth, extending into the parapharyngeal, submandibular and masticator spaces. There is a 3.2 x 1.3 cm abscess deep to the left mandibular angle and arising from the site of recent tooth 18 extraction Assessment & Plan:   Principal Problem:   Oral infection Active Problems:   Bacterial oral infection  Sepsis ruled in, sepsis secondary to oral infection after dental extraction, POA: Please disregard previous documentation of sepsis ruled out. Met sepsis criteria on admission with tachycardia and tachypnea with clear clinical signs of oral infection.  Now she also has abscess based on the MRI on 07/18/2022. EDP discussed with Dr. Mia Creek from dentistry who recommended no intervention necessary. Appreciate Dr. Jearld Fenton from ENT who evaluated the patient.  He also recommended no intervention since there was no abscess on the initial CT scan.  Now that there is abscess on the MRI, I have sent him a secure chat message to see the patient since patient might now need intervention.  Initially patient was on clindamycin, subsequently transitioned to Rocephin and Flagyl.  Continue those.   History of ulcerative  colitis. On mesalamine and Remicade. At risk for further worsening due to immunosuppression. Monitor closely.   Prolonged QTc. On admission QTc was 561.  Repeat EKG shows resolution of prolonged QTc.   Hypokalemia.  Low again.  Will replace.   Acute anemia. Microcytosis secondary to iron deficiency Baseline hemoglobin appears to be around 12-13. On admission hemoglobin was 11.7. Dropping down to 9s likely dilutional in nature.  No active bleeding reported. Will treat with oral iron replacement.   Anxiety: On Wellbutrin and Cymbalta. Continue Seroquel, Topamax, Atarax, gabapentin.   HLD. Continue statin.   Class III obesity Body mass index is 42.91 kg/m.  Placing the patient at high risk of poor outcome.  Diet modification and weight loss counseled.  DVT prophylaxis: enoxaparin (LOVENOX) injection 40 mg Start: 07/17/22 1000   Code Status: Full Code  Family Communication:  None present at bedside.  Plan of care discussed with patient in length and he/she verbalized understanding and agreed with it.  Status is: Inpatient Remains inpatient appropriate because: Needs reevaluation by ENT for new abscess at the floor of the mouth.   Estimated body mass index is 42.91 kg/m as calculated from the following:   Height as of this encounter: 5\' 7"  (1.702 m).   Weight as of this encounter: 124.3 kg.    Nutritional Assessment: Body mass index is 42.91 kg/m.Marland Kitchen Seen by dietician.  I agree with the assessment and plan as outlined below: Nutrition Status:        . Skin Assessment: I have examined the patient's skin and I agree with the wound assessment as performed by the wound  care RN as outlined below:    Consultants:  ENT  Procedures:  As above  Antimicrobials:  Anti-infectives (From admission, onward)    Start     Dose/Rate Route Frequency Ordered Stop   07/18/22 1200  cefTRIAXone (ROCEPHIN) 2 g in sodium chloride 0.9 % 100 mL IVPB        2 g 200 mL/hr over 30  Minutes Intravenous Every 24 hours 07/18/22 0941     07/18/22 1000  metroNIDAZOLE (FLAGYL) IVPB 500 mg        500 mg 100 mL/hr over 60 Minutes Intravenous Every 12 hours 07/18/22 0941     07/17/22 2200  clindamycin (CLEOCIN) IVPB 300 mg  Status:  Discontinued        300 mg 100 mL/hr over 30 Minutes Intravenous Every 6 hours 07/17/22 1557 07/18/22 0941   07/17/22 0000  metroNIDAZOLE (FLAGYL) IVPB 500 mg  Status:  Discontinued        500 mg 100 mL/hr over 60 Minutes Intravenous Every 12 hours 07/16/22 2308 07/16/22 2309   07/16/22 1830  clindamycin (CLEOCIN) IVPB 600 mg  Status:  Discontinued        600 mg 100 mL/hr over 30 Minutes Intravenous Every 6 hours 07/16/22 1829 07/17/22 1521   07/16/22 1730  metroNIDAZOLE (FLAGYL) IVPB 500 mg        500 mg 100 mL/hr over 60 Minutes Intravenous  Once 07/16/22 1722 07/16/22 1926         Subjective: Patient seen and examined.  Although overall her pain is improved but her swelling on the left mandibular area is much worse today.  She is tender to palpation as well.  Objective: Vitals:   07/18/22 0746 07/18/22 1612 07/18/22 2050 07/19/22 0416  BP: (!) 144/85 139/85 127/80 130/84  Pulse: (!) 113 95 90 (!) 107  Resp: 17 18 18 18   Temp: 98.1 F (36.7 C) 97.8 F (36.6 C) 98 F (36.7 C) 98.3 F (36.8 C)  TempSrc: Oral Oral Oral Oral  SpO2: 97% 100% 98% 97%  Weight:      Height:        Intake/Output Summary (Last 24 hours) at 07/19/2022 4098 Last data filed at 07/18/2022 1500 Gross per 24 hour  Intake 440 ml  Output --  Net 440 ml   Filed Weights   07/16/22 1528  Weight: 124.3 kg    Examination:  General exam: Appears calm and comfortable, has very visible significant swelling in the left mandibular area and tenderness to palpation. Respiratory system: Clear to auscultation. Respiratory effort normal. Cardiovascular system: S1 & S2 heard, RRR. No JVD, murmurs, rubs, gallops or clicks. No pedal edema. Gastrointestinal system:  Abdomen is nondistended, soft and nontender. No organomegaly or masses felt. Normal bowel sounds heard. Central nervous system: Alert and oriented. No focal neurological deficits. Extremities: Symmetric 5 x 5 power. Skin: No rashes, lesions or ulcers Psychiatry: Judgement and insight appear normal. Mood & affect appropriate.    Data Reviewed: I have personally reviewed following labs and imaging studies  CBC: Recent Labs  Lab 07/16/22 1525 07/17/22 0233 07/18/22 0320 07/19/22 0620  WBC 19.4* 23.9* 26.9* 20.2*  NEUTROABS 14.4*  --  24.4* 14.1*  HGB 11.7* 9.4* 9.8* 9.1*  HCT 37.8 30.4* 30.6* 28.0*  MCV 73.7* 74.3* 73.0* 71.6*  PLT 572* 459* 531* 492*   Basic Metabolic Panel: Recent Labs  Lab 07/16/22 1525 07/17/22 0233 07/18/22 0320 07/19/22 0620  NA 137 134* 136 133*  K 2.9* 4.0  3.8 3.0*  CL 101 105 108 100  CO2 24 21* 19* 24  GLUCOSE 100* 118* 140* 105*  BUN 12 7 8 8   CREATININE 0.85 0.78 0.73 0.86  CALCIUM 9.4 8.2* 8.8* 8.0*  MG  --  1.8 2.0 1.9  PHOS  --  2.8  --   --    GFR: Estimated Creatinine Clearance: 108.3 mL/min (by C-G formula based on SCr of 0.86 mg/dL). Liver Function Tests: Recent Labs  Lab 07/16/22 1525 07/18/22 0320  AST 14* 16  ALT 17 18  ALKPHOS 97 92  BILITOT 0.6 0.4  PROT 8.4* 7.0  ALBUMIN 4.2 2.7*   No results for input(s): "LIPASE", "AMYLASE" in the last 168 hours. No results for input(s): "AMMONIA" in the last 168 hours. Coagulation Profile: Recent Labs  Lab 07/16/22 1525  INR 1.0   Cardiac Enzymes: No results for input(s): "CKTOTAL", "CKMB", "CKMBINDEX", "TROPONINI" in the last 168 hours. BNP (last 3 results) No results for input(s): "PROBNP" in the last 8760 hours. HbA1C: No results for input(s): "HGBA1C" in the last 72 hours. CBG: No results for input(s): "GLUCAP" in the last 168 hours. Lipid Profile: No results for input(s): "CHOL", "HDL", "LDLCALC", "TRIG", "CHOLHDL", "LDLDIRECT" in the last 72 hours. Thyroid  Function Tests: No results for input(s): "TSH", "T4TOTAL", "FREET4", "T3FREE", "THYROIDAB" in the last 72 hours. Anemia Panel: Recent Labs    07/18/22 0320  VITAMINB12 1,898*  FERRITIN 16  TIBC 298  IRON 10*  RETICCTPCT 2.5   Sepsis Labs: Recent Labs  Lab 07/16/22 1525 07/16/22 1539  LATICACIDVEN 1.3 1.1    Recent Results (from the past 240 hour(s))  Culture, blood (Routine x 2)     Status: None (Preliminary result)   Collection Time: 07/16/22  3:20 PM   Specimen: BLOOD  Result Value Ref Range Status   Specimen Description   Final    BLOOD RIGHT ANTECUBITAL Performed at [facility], [address], [place], [place] [address]    Special Requests   Final    BOTTLES DRAWN AEROBIC AND ANAEROBIC Blood Culture adequate volume Performed at [facility], [address], [place], [place] [address]    Culture   Final    NO GROWTH 2 DAYS Performed at [facility], [address], [place], [place] [address]    Report Status PENDING  Incomplete  Culture, blood (Routine x 2)     Status: None (Preliminary result)   Collection Time: 07/16/22  3:25 PM   Specimen: BLOOD  Result Value Ref Range Status   Specimen Description   Final    BLOOD LEFT ANTECUBITAL Performed at [facility], [address], [place], [place] [address]    Special Requests   Final    BOTTLES DRAWN AEROBIC AND ANAEROBIC Blood Culture adequate volume Performed at [facility], [address], [place], [place] [address]    Culture   Final    NO GROWTH 2 DAYS Performed at [facility], [address], [place], [place] [address]    Report Status PENDING  Incomplete     Radiology Studies: MR NECK SOFT TISSUE ONLY W WO CONTRAST  Result Date: 07/19/2022 CLINICAL DATA:  Enlarged left submandibular gland. EXAM: MRI OF THE NECK WITH CONTRAST TECHNIQUE: Multiplanar, multisequence MR imaging was performed following the  administration of intravenous contrast. CONTRAST:  10mL GADAVIST GADOBUTROL 1 MMOL/ML IV SOLN COMPARISON:  None Available. FINDINGS: Pharynx and larynx: There is a large area of inflammation  at the left floor of mouth, extending into the parapharyngeal, submandibular and masticator spaces. There is a fluid collection measuring 3.2 x 1.3 cm deep to the left mandibular angle and arising from the site of recent tooth 18 extraction. Large amount of edema of the lower left face. The area of abnormality surrounds the left mandibular angle. There is abnormal enhancement of the inferior part of the left masseter. No osseous signal abnormality. There is mild rightward deviation of the oropharynx. No airway compromise. Salivary glands: There is edema surrounding the left submandibular gland and anterior to the left parotid gland. The left submandibular gland is mildly edematous itself. The right submandibular and parotid glands are normal. Thyroid: Normal Lymph nodes: Scattered subcentimeter lymph nodes Vascular: Normal flow voids Limited intracranial: Normal Visualized orbits: Normal Mastoids and visualized paranasal sinuses: Normal Skeleton: Normal Upper chest: Minimally visualized Other: None IMPRESSION: 1. Large area of inflammation at the left floor of mouth, extending into the parapharyngeal, submandibular and masticator spaces. There is a 3.2 x 1.3 cm abscess deep to the left mandibular angle and arising from the site of recent tooth 18 extraction. 2. Left facial cellulitis and inferior left masseter myositis. Electronically Signed   By: Deatra Robinson M.D.   On: 07/19/2022 00:46    Scheduled Meds:  atorvastatin  20 mg Oral QHS   buPROPion  150 mg Oral q morning   DULoxetine  60 mg Oral Daily   enoxaparin (LOVENOX) injection  40 mg Subcutaneous Daily   ferrous sulfate  325 mg Oral Q breakfast   gabapentin  300 mg Oral TID   loratadine  10 mg Oral Daily   mesalamine  1.2 g Oral Daily   potassium chloride  40  mEq Oral Q4H   QUEtiapine  25 mg Oral TID   saccharomyces boulardii  250 mg Oral BID   topiramate  50 mg Oral Q12H   Continuous Infusions:  cefTRIAXone (ROCEPHIN)  IV 2 g (07/18/22 1248)   metronidazole 500 mg (07/18/22 2225)     LOS: 2 days   Hughie Closs, MD [facility]  07/19/2022, 8:12 AM   *Please note that this is a verbal dictation therefore any spelling or grammatical errors are due to the "Dragon [employer] One" system interpretation.  Please page via Amion and do not message via secure chat for urgent patient care matters. Secure chat can be used for non urgent patient care matters.  How to contact the [facility] Attending or Consulting provider 7A - 7P or covering provider during after hours 7P -7A, for this patient?  Check the care team in [facility] and look for a) attending/consulting [facility] provider listed and b) the [facility] team listed. Page or secure chat 7A-7P. Log into [email or web address] and use [facility] universal password to access. If you do not have the password, please contact the hospital operator. Locate the [facility] provider you are looking for under [facility] and page to a number that you can be directly reached. If you still have difficulty reaching the provider, please page the [facility] (Director on Call) for the Hospitalists listed on amion for assistance.

## 2022-07-19 NOTE — Transfer of Care (Signed)
Immediate Anesthesia Transfer of Care Note  Patient: Rebekah Silva  Procedure(s) Performed: INCISION AND DRAINAGE LEFT NECK ABSCESS (Left)  Patient Location: PACU  Anesthesia Type:General  Level of Consciousness: awake and alert   Airway & Oxygen Therapy: Patient Spontanous Breathing and Patient connected to face mask oxygen  Post-op Assessment: Report given to RN and Post -op Vital signs reviewed and stable  Post vital signs: Reviewed and stable  Last Vitals:  Vitals Value Taken Time  BP 130/95 07/19/22 1322  Temp    Pulse 84 07/19/22 1324  Resp 13 07/19/22 1324  SpO2 87 % 07/19/22 1324  Vitals shown include unvalidated device data.  Last Pain:  Vitals:   07/19/22 1123  TempSrc: Oral  PainSc: 8       Patients Stated Pain Goal: 4 (07/17/22 0005)  Complications: No notable events documented.

## 2022-07-19 NOTE — Anesthesia Preprocedure Evaluation (Addendum)
Anesthesia Evaluation  Patient identified by MRN, date of birth, ID band Patient awake    Reviewed: Allergy & Precautions, NPO status , Patient's Chart, lab work & pertinent test results  Airway Mallampati: IV  TM Distance: >3 FB Neck ROM: Full  Mouth opening: Limited Mouth Opening  Dental no notable dental hx.    Pulmonary neg pulmonary ROS   Pulmonary exam normal        Cardiovascular hypertension, Normal cardiovascular exam     Neuro/Psych  Headaches PSYCHIATRIC DISORDERS Anxiety Depression       GI/Hepatic Neg liver ROS, PUD,,,  Endo/Other    Morbid obesity  Renal/GU negative Renal ROS     Musculoskeletal  (+) Arthritis ,    Abdominal  (+) + obese  Peds  Hematology  (+) Blood dyscrasia, anemia   Anesthesia Other Findings LEFT NECK ABSCESS  Reproductive/Obstetrics                             Anesthesia Physical Anesthesia Plan  ASA: 3  Anesthesia Plan: General   Post-op Pain Management:    Induction: Intravenous  PONV Risk Score and Plan: 3 and Ondansetron, Dexamethasone, Midazolam and Treatment may vary due to age or medical condition  Airway Management Planned: Oral ETT and Video Laryngoscope Planned  Additional Equipment:   Intra-op Plan:   Post-operative Plan: Extubation in OR  Informed Consent: I have reviewed the patients History and Physical, chart, labs and discussed the procedure including the risks, benefits and alternatives for the proposed anesthesia with the patient or authorized representative who has indicated his/her understanding and acceptance.     Dental advisory given  Plan Discussed with: CRNA  Anesthesia Plan Comments:        Anesthesia Quick Evaluation

## 2022-07-20 ENCOUNTER — Encounter (HOSPITAL_COMMUNITY): Payer: Self-pay | Admitting: Otolaryngology

## 2022-07-20 DIAGNOSIS — K122 Cellulitis and abscess of mouth: Secondary | ICD-10-CM | POA: Diagnosis not present

## 2022-07-20 DIAGNOSIS — E876 Hypokalemia: Secondary | ICD-10-CM | POA: Insufficient documentation

## 2022-07-20 LAB — CBC WITH DIFFERENTIAL/PLATELET
Abs Immature Granulocytes: 0 10*3/uL (ref 0.00–0.07)
Basophils Absolute: 0 10*3/uL (ref 0.0–0.1)
Basophils Relative: 0 %
Eosinophils Absolute: 0 10*3/uL (ref 0.0–0.5)
Eosinophils Relative: 0 %
HCT: 32.5 % — ABNORMAL LOW (ref 36.0–46.0)
Hemoglobin: 10 g/dL — ABNORMAL LOW (ref 12.0–15.0)
Lymphocytes Relative: 11 %
Lymphs Abs: 2.8 10*3/uL (ref 0.7–4.0)
MCH: 22.9 pg — ABNORMAL LOW (ref 26.0–34.0)
MCHC: 30.8 g/dL (ref 30.0–36.0)
MCV: 74.4 fL — ABNORMAL LOW (ref 80.0–100.0)
Monocytes Absolute: 1 10*3/uL (ref 0.1–1.0)
Monocytes Relative: 4 %
Neutro Abs: 21.9 10*3/uL — ABNORMAL HIGH (ref 1.7–7.7)
Neutrophils Relative %: 85 %
Platelets: 633 10*3/uL — ABNORMAL HIGH (ref 150–400)
RBC: 4.37 MIL/uL (ref 3.87–5.11)
RDW: 23.3 % — ABNORMAL HIGH (ref 11.5–15.5)
WBC: 25.8 10*3/uL — ABNORMAL HIGH (ref 4.0–10.5)
nRBC: 0 /100 WBC
nRBC: 0.2 % (ref 0.0–0.2)

## 2022-07-20 LAB — BASIC METABOLIC PANEL
Anion gap: 11 (ref 5–15)
BUN: 7 mg/dL (ref 6–20)
CO2: 23 mmol/L (ref 22–32)
Calcium: 8.7 mg/dL — ABNORMAL LOW (ref 8.9–10.3)
Chloride: 102 mmol/L (ref 98–111)
Creatinine, Ser: 0.95 mg/dL (ref 0.44–1.00)
GFR, Estimated: >60 mL/min (ref >60)
Glucose, Bld: 139 mg/dL — ABNORMAL HIGH (ref 70–99)
Potassium: 3.2 mmol/L — ABNORMAL LOW (ref 3.5–5.1)
Sodium: 136 mmol/L (ref 135–145)

## 2022-07-20 LAB — AEROBIC/ANAEROBIC CULTURE W GRAM STAIN (SURGICAL/DEEP WOUND): Culture: NO GROWTH

## 2022-07-20 NOTE — Anesthesia Postprocedure Evaluation (Signed)
Anesthesia Post Note  Patient: Rebekah Silva  Procedure(s) Performed: INCISION AND DRAINAGE LEFT NECK ABSCESS (Left)     Patient location during evaluation: PACU Anesthesia Type: General Level of consciousness: awake Pain management: pain level controlled Vital Signs Assessment: post-procedure vital signs reviewed and stable Respiratory status: spontaneous breathing, nonlabored ventilation and respiratory function stable Cardiovascular status: blood pressure returned to baseline and stable Postop Assessment: no apparent nausea or vomiting Anesthetic complications: no   No notable events documented.  Last Vitals:  Vitals:   07/19/22 2124 07/20/22 0551  BP: 129/81 119/82  Pulse: (!) 110 (!) 107  Resp: 18 18  Temp: 37.2 C 36.7 C  SpO2: 98% 100%    Last Pain:  Vitals:   07/20/22 0551  TempSrc: Oral  PainSc:                  Yuvan Medinger P Margorie Renner

## 2022-07-20 NOTE — Plan of Care (Signed)

## 2022-07-20 NOTE — Progress Notes (Signed)
Patient ID: Rebekah Silva, female   DOB: 01-23-1971, 52 y.o.   MRN: [id number]  She is feeling slightly better.   Swelling about the same. Culture pending. Drains intact and working.   Regular diet and continue IV antibiotics.

## 2022-07-20 NOTE — Progress Notes (Addendum)
PROGRESS NOTE    Rebekah Silva  ZOX:[id number] DOB: 1970-04-24 DOA: 07/16/2022 PCP: Laurann Montana, MD   Brief Narrative:  Patient with PMH of ulcerative colitis on Remicade and Lialda, MDD, GAD presents to the hospital with complaints of left jaw pain. Had a root canal originally.Continue to have pain and swelling and therefore underwent tooth extraction 2 days ago, 5/3 outpatient. Had severe swelling of her left jaw with pain unable to open up her mouth and therefore was referred to ED. ED discussed with on-call dentist on 5/5.  Recommended that the patient does not need to see oral surgery. ENT consult appreciated currently no intervention recommended. Due to ongoing complaint of worsening left jaw pain MRI neck ordered on 07/18/2022 which showed large area of inflammation at the left floor of mouth, extending into the parapharyngeal, submandibular and masticator spaces. There is a 3.2 x 1.3 cm abscess deep to the left mandibular angle and arising from the site of recent tooth 18 extraction Assessment & Plan:   Principal Problem:   Oral infection Active Problems:   Bacterial oral infection   Hypokalemia  Sepsis ruled in, sepsis secondary to oral infection after dental extraction, POA:  there was no abscess on the initial CT scan but MRI showed abscess deep to the left mandibular angle arising from the site of the recent tooth extraction along with inflammation on the floor of the mouth extending into the parapharyngeal and submandibular masticator spaces.  Patient underwent incision and drainage by Dr. Merceda Elks on 07/19/2022.  Swelling has reduced somewhat but her pain is increased.  She was reassessed by ENT again today and they recommended continue antibiotics.  She prefers to advance diet to soft diet.  Has slightly worsened leukocytosis but no fever.  Hypokalemia: Will replace.   History of ulcerative colitis. On mesalamine and Remicade. At risk for further worsening due to  immunosuppression. Monitor closely.   Prolonged QTc. On admission QTc was 561.  Repeat EKG shows resolution of prolonged QTc.   Hypokalemia.  Low again.  Will replace.   Acute anemia. Microcytosis secondary to iron deficiency Baseline hemoglobin appears to be around 12-13. On admission hemoglobin was 11.7. Dropping down to 9s likely dilutional in nature.  No active bleeding reported. Will treat with oral iron replacement.   Anxiety: On Wellbutrin and Cymbalta. Continue Seroquel, Topamax, Atarax, gabapentin.   HLD. Continue statin.   Class III obesity Body mass index is 42.91 kg/m.  Placing the patient at high risk of poor outcome.  Diet modification and weight loss counseled.  DVT prophylaxis: enoxaparin (LOVENOX) injection 40 mg Start: 07/17/22 1000   Code Status: Full Code  Family Communication:  None present at bedside.  Plan of care discussed with patient in length and he/she verbalized understanding and agreed with it.  Status is: Inpatient Remains inpatient appropriate because: Will be discharged when cleared by ENT.  Per them, she still needs IV antibiotics.   Estimated body mass index is 42.91 kg/m as calculated from the following:   Height as of this encounter: 5\' 7"  (1.702 m).   Weight as of this encounter: 124.3 kg.    Nutritional Assessment: Body mass index is 42.91 kg/m.Marland Kitchen Seen by dietician.  I agree with the assessment and plan as outlined below: Nutrition Status:        . Skin Assessment: I have examined the patient's skin and I agree with the wound assessment as performed by the wound care RN as outlined below:    Consultants:  ENT  Procedures:  As above  Antimicrobials:  Anti-infectives (From admission, onward)    Start     Dose/Rate Route Frequency Ordered Stop   07/18/22 1200  cefTRIAXone (ROCEPHIN) 2 g in sodium chloride 0.9 % 100 mL IVPB        2 g 200 mL/hr over 30 Minutes Intravenous Every 24 hours 07/18/22 0941     07/18/22 1000   metroNIDAZOLE (FLAGYL) IVPB 500 mg        500 mg 100 mL/hr over 60 Minutes Intravenous Every 12 hours 07/18/22 0941     07/17/22 2200  clindamycin (CLEOCIN) IVPB 300 mg  Status:  Discontinued        300 mg 100 mL/hr over 30 Minutes Intravenous Every 6 hours 07/17/22 1557 07/18/22 0941   07/17/22 0000  metroNIDAZOLE (FLAGYL) IVPB 500 mg  Status:  Discontinued        500 mg 100 mL/hr over 60 Minutes Intravenous Every 12 hours 07/16/22 2308 07/16/22 2309   07/16/22 1830  clindamycin (CLEOCIN) IVPB 600 mg  Status:  Discontinued        600 mg 100 mL/hr over 30 Minutes Intravenous Every 6 hours 07/16/22 1829 07/17/22 1521   07/16/22 1730  metroNIDAZOLE (FLAGYL) IVPB 500 mg        500 mg 100 mL/hr over 60 Minutes Intravenous  Once 07/16/22 1722 07/16/22 1926         Subjective: Patient seen and examined.  She states that her swelling is decreased but her pain is slightly increased.  She is still comfortable trying soft food.  Objective: Vitals:   07/19/22 1523 07/19/22 2124 07/20/22 0551 07/20/22 0830  BP: (!) 119/95 129/81 119/82 113/73  Pulse: 93 (!) 110 (!) 107 (!) 116  Resp:  18 18   Temp: (!) 97.5 F (36.4 C) 99 F (37.2 C) 98.1 F (36.7 C) 98.1 F (36.7 C)  TempSrc: Oral Oral Oral Oral  SpO2: 99% 98% 100% 98%  Weight:      Height:        Intake/Output Summary (Last 24 hours) at 07/20/2022 1042 Last data filed at 07/20/2022 0130 Gross per 24 hour  Intake 600 ml  Output --  Net 600 ml   Filed Weights   07/16/22 1528  Weight: 124.3 kg    Examination:  General exam: Appears calm and comfortable, has been able swelling in the left mandibular area and has dressing on it. Respiratory system: Clear to auscultation. Respiratory effort normal. Cardiovascular system: S1 & S2 heard, RRR. No JVD, murmurs, rubs, gallops or clicks. No pedal edema. Gastrointestinal system: Abdomen is nondistended, soft and nontender. No organomegaly or masses felt. Normal bowel sounds  heard. Central nervous system: Alert and oriented. No focal neurological deficits. Extremities: Symmetric 5 x 5 power. Skin: No rashes, lesions or ulcers.  Psychiatry: Judgement and insight appear normal. Mood & affect appropriate.   Data Reviewed: I have personally reviewed following labs and imaging studies  CBC: Recent Labs  Lab 07/16/22 1525 07/17/22 0233 07/18/22 0320 07/19/22 0620 07/20/22 0827  WBC 19.4* 23.9* 26.9* 20.2* 25.8*  NEUTROABS 14.4*  --  24.4* 14.1* 21.9*  HGB 11.7* 9.4* 9.8* 9.1* 10.0*  HCT 37.8 30.4* 30.6* 28.0* 32.5*  MCV 73.7* 74.3* 73.0* 71.6* 74.4*  PLT 572* 459* 531* 492* 633*   Basic Metabolic Panel: Recent Labs  Lab 07/16/22 1525 07/17/22 0233 07/18/22 0320 07/19/22 0620 07/20/22 0827  NA 137 134* 136 133* 136  K 2.9* 4.0 3.8 3.0*  3.2*  CL 101 105 108 100 102  CO2 24 21* 19* 24 23  GLUCOSE 100* 118* 140* 105* 139*  BUN 12 7 8 8 7   CREATININE 0.85 0.78 0.73 0.86 0.95  CALCIUM 9.4 8.2* 8.8* 8.0* 8.7*  MG  --  1.8 2.0 1.9  --   PHOS  --  2.8  --   --   --    GFR: Estimated Creatinine Clearance: 98 mL/min (by C-G formula based on SCr of 0.95 mg/dL). Liver Function Tests: Recent Labs  Lab 07/16/22 1525 07/18/22 0320  AST 14* 16  ALT 17 18  ALKPHOS 97 92  BILITOT 0.6 0.4  PROT 8.4* 7.0  ALBUMIN 4.2 2.7*   No results for input(s): "LIPASE", "AMYLASE" in the last 168 hours. No results for input(s): "AMMONIA" in the last 168 hours. Coagulation Profile: Recent Labs  Lab 07/16/22 1525  INR 1.0   Cardiac Enzymes: No results for input(s): "CKTOTAL", "CKMB", "CKMBINDEX", "TROPONINI" in the last 168 hours. BNP (last 3 results) No results for input(s): "PROBNP" in the last 8760 hours. HbA1C: No results for input(s): "HGBA1C" in the last 72 hours. CBG: No results for input(s): "GLUCAP" in the last 168 hours. Lipid Profile: No results for input(s): "CHOL", "HDL", "LDLCALC", "TRIG", "CHOLHDL", "LDLDIRECT" in the last 72 hours. Thyroid  Function Tests: No results for input(s): "TSH", "T4TOTAL", "FREET4", "T3FREE", "THYROIDAB" in the last 72 hours. Anemia Panel: Recent Labs    07/18/22 0320  VITAMINB12 1,898*  FERRITIN 16  TIBC 298  IRON 10*  RETICCTPCT 2.5   Sepsis Labs: Recent Labs  Lab 07/16/22 1525 07/16/22 1539 07/19/22 0620  PROCALCITON  --   --  <0.10  LATICACIDVEN 1.3 1.1  --     Recent Results (from the past 240 hour(s))  Culture, blood (Routine x 2)     Status: None (Preliminary result)   Collection Time: 07/16/22  3:20 PM   Specimen: BLOOD  Result Value Ref Range Status   Specimen Description   Final    BLOOD RIGHT ANTECUBITAL Performed at [facility], [address] [place], [place] [address]    Special Requests   Final    BOTTLES DRAWN AEROBIC AND ANAEROBIC Blood Culture adequate volume Performed at [facility], [address], [place], [place] [address]    Culture   Final    NO GROWTH 4 DAYS Performed at [facility], [address] [place], [place] [address]    Report Status PENDING  Incomplete  Culture, blood (Routine x 2)     Status: None (Preliminary result)   Collection Time: 07/16/22  3:25 PM   Specimen: BLOOD  Result Value Ref Range Status   Specimen Description   Final    BLOOD LEFT ANTECUBITAL Performed at [facility], [address] [place], [place] [address]    Special Requests   Final    BOTTLES DRAWN AEROBIC AND ANAEROBIC Blood Culture adequate volume Performed at [facility], [address] [place], [place] [address]    Culture   Final    NO GROWTH 4 DAYS Performed at [facility], [address] [place], [place] [address]    Report Status PENDING  Incomplete  Aerobic/Anaerobic Culture w Gram Stain (surgical/deep wound)     Status: None (Preliminary result)   Collection Time: 07/19/22  1:00 PM   Specimen: Neck, Left; Abscess  Result Value Ref Range Status    Specimen Description ABSCESS LEFT NECK  Final   Special Requests NONE  Final   Gram Stain   Final    FEW WBC PRESENT,BOTH PMN AND MONONUCLEAR RARE GRAM POSITIVE COCCI    Culture   Final    NO GROWTH < 24 HOURS Performed at [facility], [address] [place], [place] [address]    Report Status PENDING  Incomplete     Radiology Studies: MR NECK SOFT TISSUE ONLY W WO CONTRAST  Result Date: 07/19/2022 CLINICAL DATA:  Enlarged left submandibular gland. EXAM: MRI OF THE NECK WITH CONTRAST TECHNIQUE: Multiplanar, multisequence MR imaging was performed following the administration of intravenous contrast. CONTRAST:  10mL GADAVIST GADOBUTROL 1 MMOL/ML IV SOLN COMPARISON:  None Available. FINDINGS: Pharynx and larynx: There is a large area of inflammation at the left floor of mouth, extending into the parapharyngeal, submandibular and masticator spaces. There is a fluid collection measuring 3.2 x 1.3 cm deep to the left mandibular angle and arising from the site of recent tooth 18 extraction. Large amount of edema of the lower left face. The area of abnormality surrounds the left mandibular angle. There is abnormal enhancement of the inferior part of the left masseter. No osseous signal abnormality. There is mild rightward deviation of the oropharynx. No airway compromise. Salivary glands: There is edema surrounding the left submandibular gland and anterior to the left parotid gland. The left submandibular gland is mildly edematous itself. The right submandibular and parotid glands are normal. Thyroid: Normal Lymph nodes: Scattered subcentimeter lymph nodes Vascular: Normal flow voids Limited intracranial: Normal Visualized orbits: Normal Mastoids and visualized paranasal sinuses: Normal Skeleton: Normal Upper chest: Minimally visualized Other: None IMPRESSION: 1. Large area of inflammation at the left floor of mouth, extending into the parapharyngeal, submandibular and masticator spaces. There is a  3.2 x 1.3 cm abscess deep to the left mandibular angle and arising from the site of recent tooth 18 extraction. 2. Left facial cellulitis and inferior left masseter myositis. Electronically Signed   By: Deatra Robinson M.D.   On: 07/19/2022 00:46    Scheduled Meds:  atorvastatin  20 mg Oral QHS   buPROPion  150 mg Oral q morning   DULoxetine  60 mg Oral Daily   enoxaparin (LOVENOX) injection  40 mg Subcutaneous Daily   ferrous sulfate  325 mg Oral Q breakfast   gabapentin  300 mg Oral TID   loratadine  10 mg Oral Daily   mesalamine  1.2 g Oral Daily   QUEtiapine  25 mg Oral TID   saccharomyces boulardii  250 mg Oral BID   topiramate  50 mg Oral Q12H   Continuous Infusions:  cefTRIAXone (ROCEPHIN)  IV 200 mL/hr at 07/19/22 1200   metronidazole 500 mg (07/20/22 0936)     LOS: 3 days   Hughie Closs, MD [facility]  07/20/2022, 10:42 AM   *Please note that this is a verbal dictation therefore any spelling or grammatical errors are due to the "Dragon [employer] One" system interpretation.  Please page via Amion and do not message via secure chat for urgent patient care matters. Secure chat can be used for non urgent patient care matters.  How to contact the [facility] Attending or Consulting provider 7A - 7P or covering provider during after hours 7P -7A, for this patient?  Check the care team in [facility] and look for a) attending/consulting [facility] provider listed and b) the [facility] team listed. Page or secure chat 7A-7P. Log into [email or web address] and use [place]'s universal password  to access. If you do not have the password, please contact the hospital operator. Locate the [facility] provider you are looking for under [facility] and page to a number that you can be directly reached. If you still have difficulty reaching the provider, please page the [facility] (Director on Call) for the Hospitalists listed on amion for assistance.

## 2022-07-21 DIAGNOSIS — K5190 Ulcerative colitis, unspecified, without complications: Secondary | ICD-10-CM | POA: Diagnosis not present

## 2022-07-21 DIAGNOSIS — K122 Cellulitis and abscess of mouth: Secondary | ICD-10-CM | POA: Diagnosis not present

## 2022-07-21 LAB — CBC WITH DIFFERENTIAL/PLATELET
Abs Immature Granulocytes: 0.8 10*3/uL — ABNORMAL HIGH (ref 0.00–0.07)
Basophils Absolute: 0 10*3/uL (ref 0.0–0.1)
Basophils Relative: 0 %
Eosinophils Absolute: 0.8 10*3/uL — ABNORMAL HIGH (ref 0.0–0.5)
Eosinophils Relative: 3 %
HCT: 30.7 % — ABNORMAL LOW (ref 36.0–46.0)
Hemoglobin: 10 g/dL — ABNORMAL LOW (ref 12.0–15.0)
Lymphocytes Relative: 11 %
Lymphs Abs: 2.9 10*3/uL (ref 0.7–4.0)
MCH: 23.4 pg — ABNORMAL LOW (ref 26.0–34.0)
MCHC: 32.6 g/dL (ref 30.0–36.0)
MCV: 71.9 fL — ABNORMAL LOW (ref 80.0–100.0)
Monocytes Absolute: 1 10*3/uL (ref 0.1–1.0)
Monocytes Relative: 4 %
Myelocytes: 2 %
Neutro Abs: 20.6 10*3/uL — ABNORMAL HIGH (ref 1.7–7.7)
Neutrophils Relative %: 79 %
Platelets: 565 10*3/uL — ABNORMAL HIGH (ref 150–400)
Promyelocytes Relative: 1 %
RBC: 4.27 MIL/uL (ref 3.87–5.11)
RDW: 22.7 % — ABNORMAL HIGH (ref 11.5–15.5)
WBC: 26.1 10*3/uL — ABNORMAL HIGH (ref 4.0–10.5)
nRBC: 0.4 % — ABNORMAL HIGH (ref 0.0–0.2)
nRBC: 1 /100 WBC — ABNORMAL HIGH

## 2022-07-21 LAB — CULTURE, BLOOD (ROUTINE X 2)
Culture: NO GROWTH
Culture: NO GROWTH
Special Requests: ADEQUATE
Special Requests: ADEQUATE

## 2022-07-21 LAB — BASIC METABOLIC PANEL
Anion gap: 8 (ref 5–15)
BUN: 7 mg/dL (ref 6–20)
CO2: 25 mmol/L (ref 22–32)
Calcium: 8.3 mg/dL — ABNORMAL LOW (ref 8.9–10.3)
Chloride: 99 mmol/L (ref 98–111)
Creatinine, Ser: 0.75 mg/dL (ref 0.44–1.00)
GFR, Estimated: >60 mL/min (ref >60)
Glucose, Bld: 115 mg/dL — ABNORMAL HIGH (ref 70–99)
Potassium: 3.2 mmol/L — ABNORMAL LOW (ref 3.5–5.1)
Sodium: 132 mmol/L — ABNORMAL LOW (ref 135–145)

## 2022-07-21 LAB — AEROBIC/ANAEROBIC CULTURE W GRAM STAIN (SURGICAL/DEEP WOUND)

## 2022-07-21 MED ORDER — DEXAMETHASONE SODIUM PHOSPHATE 10 MG/ML IJ SOLN: 4.0000 mg | Freq: Four times a day (QID) | INTRAMUSCULAR | Status: DC

## 2022-07-21 MED ORDER — DIPHENHYDRAMINE HCL 50 MG/ML IJ SOLN: 25.0000 mg | Freq: Once | INTRAMUSCULAR | Status: DC | PRN

## 2022-07-21 MED ORDER — HYDROMORPHONE HCL 1 MG/ML IJ SOLN
0.5000 mg | INTRAMUSCULAR | Status: DC | PRN
  Administered 2022-07-23 – 2022-07-26 (×6): 0.5 mg via INTRAVENOUS
  Filled 2022-07-21 (×6): qty 0.5

## 2022-07-21 MED ORDER — DEXAMETHASONE SODIUM PHOSPHATE 10 MG/ML IJ SOLN
8.0000 mg | Freq: Three times a day (TID) | INTRAMUSCULAR | Status: AC
  Administered 2022-07-21 – 2022-07-22 (×3): 8 mg via INTRAVENOUS
  Filled 2022-07-21 (×3): qty 1

## 2022-07-21 MED ORDER — VANCOMYCIN HCL 750 MG/150ML IV SOLN
750.0000 mg | Freq: Three times a day (TID) | INTRAVENOUS | Status: DC
  Administered 2022-07-22 – 2022-07-24 (×8): 750 mg via INTRAVENOUS
  Filled 2022-07-21 (×9): qty 150

## 2022-07-21 MED ORDER — VANCOMYCIN HCL 10 G IV SOLR
2250.0000 mg | Freq: Once | INTRAVENOUS | Status: AC
  Administered 2022-07-21: 2250 mg via INTRAVENOUS
  Filled 2022-07-21: qty 2250

## 2022-07-21 MED ORDER — DEXAMETHASONE SODIUM PHOSPHATE 10 MG/ML IJ SOLN
10.0000 mg | Freq: Once | INTRAMUSCULAR | Status: AC
  Administered 2022-07-21: 10 mg via INTRAVENOUS
  Filled 2022-07-21: qty 1

## 2022-07-21 MED ORDER — AMOXICILLIN 500 MG PO CAPS
500.0000 mg | ORAL_CAPSULE | Freq: Once | ORAL | Status: AC
  Administered 2022-07-21: 500 mg via ORAL
  Filled 2022-07-21: qty 1

## 2022-07-21 MED ORDER — EPINEPHRINE 0.3 MG/0.3ML IJ SOAJ: 0.3000 mg | Freq: Once | INTRAMUSCULAR | Status: DC | PRN

## 2022-07-21 MED ORDER — POTASSIUM CHLORIDE CRYS ER 20 MEQ PO TBCR
40.0000 meq | EXTENDED_RELEASE_TABLET | ORAL | Status: AC
  Administered 2022-07-21 (×2): 40 meq via ORAL
  Filled 2022-07-21 (×2): qty 2

## 2022-07-21 NOTE — Progress Notes (Signed)
Pharmacy Antibiotic Note  Rebekah Silva is a 52 y.o. female admitted on 07/16/2022 with mandibular abscess. Patient has been on clinda>> ceftriaxone/flagyl and has experienced worsening of her symptoms. She is s/p I and D of the abscess on 5/8 with culture pending but gram stain showing gram positive cocci. Now with a  concern for MRSA. Pharmacy has been consulted for vancomycin dosing. SCr < 1 with CrCl > 100 ml/min.   Plan: Vancomycin 2250 mg X 1 then 750 mg every 8 hours  Predicted AUC 495 with SCr 0.8 and Vd 0.5 Monitor renal function, cultures, and levels as appropriate  Height: 5\' 7"  (170.2 cm) Weight: 124.3 kg (274 lb) IBW/kg (Calculated) : 61.6  Temp (24hrs), Avg:98.3 F (36.8 C), Min:97.4 F (36.3 C), Max:99 F (37.2 C)  Recent Labs  Lab 07/16/22 1525 07/16/22 1539 07/17/22 0233 07/18/22 0320 07/19/22 0620 07/20/22 0827 07/21/22 0206  WBC 19.4*  --  23.9* 26.9* 20.2* 25.8* 26.1*  CREATININE 0.85  --  0.78 0.73 0.86 0.95 0.75  LATICACIDVEN 1.3 1.1  --   --   --   --   --     Estimated Creatinine Clearance: 116.4 mL/min (by C-G formula based on SCr of 0.75 mg/dL).    Allergies  Allergen Reactions   Aspirin Other (See Comments)    Ulcerative colitis, abdominal pain   Ceftin [Cefuroxime] Other (See Comments)    Diarrhea, abdominal cramping   Codeine Other (See Comments)    Severe headaches   Lactose Other (See Comments)    Dairy - sets off my colitis   Other Rash and Other (See Comments)    Contact metal agents    Penicillins Rash and Other (See Comments)     Thank you for allowing pharmacy to be a part of this patient's care.  Sharin Mons, PharmD, BCPS, BCIDP Infectious Diseases Clinical Pharmacist Phone: [phone] 07/21/2022 1:24 PM

## 2022-07-21 NOTE — Progress Notes (Signed)
Pharmacy Note- Penicillin Allergy Clarification   ASSESSMENT: 52 year old female that reports a rash to a penicillin antibiotic when she was 52 years old. She cannot remember which penicillin the allergy occurred to but it did not involve airway compromise.    PEN-FAST Scoring   Five years or less since last reaction 0  Anaphylaxis/Angioedema OR Severe cutaneous adverse reaction  0  Treatment required for reaction  0  Total Score 0 points - Very low risk of positive penicillin allergy test (<1%)      Type of intervention (select all that apply):  Amoxicillin Oral Challenge     PLAN: - amoxicillin 500 mg PO x1  - Q79min vitals monitoring x 1 hour  - Epi-Pen PRN  - IV diphenhydramine PRN  - plan communicated to primary RN

## 2022-07-21 NOTE — Plan of Care (Signed)

## 2022-07-21 NOTE — Progress Notes (Signed)
PROGRESS NOTE    Rebekah Silva  ZOX:[id number] DOB: 1970/12/13 DOA: 07/16/2022 PCP: Laurann Montana, MD   Brief Narrative:  Patient with PMH of ulcerative colitis on Remicade and Lialda, MDD, GAD presents to the hospital with complaints of left jaw pain. Had a root canal originally.Continue to have pain and swelling and therefore underwent tooth extraction 2 days ago, 5/3 outpatient. Had severe swelling of her left jaw with pain unable to open up her mouth and therefore was referred to ED. ED discussed with on-call dentist on 5/5.  Recommended that the patient does not need to see oral surgery. ENT consult appreciated currently no intervention recommended. Due to ongoing complaint of worsening left jaw pain MRI neck ordered on 07/18/2022 which showed large area of inflammation at the left floor of mouth, extending into the parapharyngeal, submandibular and masticator spaces. There is a 3.2 x 1.3 cm abscess deep to the left mandibular angle and arising from the site of recent tooth 18 extraction Assessment & Plan:   Principal Problem:   Oral infection Active Problems:   Bacterial oral infection   Hypokalemia  Sepsis ruled in, sepsis secondary to oral infection after dental extraction, POA:  there was no abscess on the initial CT scan but MRI showed abscess deep to the left mandibular angle arising from the site of the recent tooth extraction along with inflammation on the floor of the mouth extending into the parapharyngeal and submandibular masticator spaces.  Patient underwent incision and drainage by Dr. Merceda Elks on 07/19/2022.  She had improved yesterday but over the last 24 hours, she has gotten worse, she has worsening pain, swelling and unable to open mouth as much as she was able to yesterday.  ENT is following, they have recommended ID consultation, ID is consulted.  Slight rise in white cells as well.  Will defer management to ID and ENT.  She is on oxycodone and IV  hydromorphone.  Hypokalemia: Low again, will replace.   History of ulcerative colitis. On mesalamine and Remicade. At risk for further worsening due to immunosuppression. Monitor closely.   Prolonged QTc. On admission QTc was 561.  Repeat EKG shows resolution of prolonged QTc.   Hypokalemia.  Low again.  Will replace.   Acute anemia. Microcytosis secondary to iron deficiency Baseline hemoglobin appears to be around 12-13. On admission hemoglobin was 11.7. Dropping down to 9s likely dilutional in nature.  No active bleeding reported. Will treat with oral iron replacement.   Anxiety: On Wellbutrin and Cymbalta. Continue Seroquel, Topamax, Atarax, gabapentin.   HLD. Continue statin.  Mild hyponatremia: Stable.   Class III obesity Body mass index is 42.91 kg/m.  Placing the patient at high risk of poor outcome.  Diet modification and weight loss counseled.  DVT prophylaxis: enoxaparin (LOVENOX) injection 40 mg Start: 07/17/22 1000   Code Status: Full Code  Family Communication:  None present at bedside.  Plan of care discussed with patient in length and he/she verbalized understanding and agreed with it.  Status is: Inpatient Remains inpatient appropriate because: Will be discharged when cleared by ENT.  She is having severe pain.   Estimated body mass index is 42.91 kg/m as calculated from the following:   Height as of this encounter: 5\' 7"  (1.702 m).   Weight as of this encounter: 124.3 kg.    Nutritional Assessment: Body mass index is 42.91 kg/m.Marland Kitchen Seen by dietician.  I agree with the assessment and plan as outlined below: Nutrition Status:        .  Skin Assessment: I have examined the patient's skin and I agree with the wound assessment as performed by the wound care RN as outlined below:    Consultants:  ENT ID Procedures:  As above  Antimicrobials:  Anti-infectives (From admission, onward)    Start     Dose/Rate Route Frequency Ordered Stop    07/18/22 1200  cefTRIAXone (ROCEPHIN) 2 g in sodium chloride 0.9 % 100 mL IVPB        2 g 200 mL/hr over 30 Minutes Intravenous Every 24 hours 07/18/22 0941     07/18/22 1000  metroNIDAZOLE (FLAGYL) IVPB 500 mg        500 mg 100 mL/hr over 60 Minutes Intravenous Every 12 hours 07/18/22 0941     07/17/22 2200  clindamycin (CLEOCIN) IVPB 300 mg  Status:  Discontinued        300 mg 100 mL/hr over 30 Minutes Intravenous Every 6 hours 07/17/22 1557 07/18/22 0941   07/17/22 0000  metroNIDAZOLE (FLAGYL) IVPB 500 mg  Status:  Discontinued        500 mg 100 mL/hr over 60 Minutes Intravenous Every 12 hours 07/16/22 2308 07/16/22 2309   07/16/22 1830  clindamycin (CLEOCIN) IVPB 600 mg  Status:  Discontinued        600 mg 100 mL/hr over 30 Minutes Intravenous Every 6 hours 07/16/22 1829 07/17/22 1521   07/16/22 1730  metroNIDAZOLE (FLAGYL) IVPB 500 mg        500 mg 100 mL/hr over 60 Minutes Intravenous  Once 07/16/22 1722 07/16/22 1926         Subjective: Patient seen and examined.  She complains of worsening pain and swelling in the left mandibular angle.  She is typically very joyous but she was in tears today, her pain appears to be severe.  Objective: Vitals:   07/20/22 1523 07/20/22 1952 07/21/22 0328 07/21/22 0906  BP: 130/81 126/80 137/87 113/73  Pulse: (!) 103 (!) 107 (!) 110 95  Resp:  20 18 12   Temp: (!) 97.4 F (36.3 C) 98.1 F (36.7 C) 99 F (37.2 C) 98.5 F (36.9 C)  TempSrc: Oral Oral Oral Oral  SpO2: 98% 100% 96% 96%  Weight:      Height:        Intake/Output Summary (Last 24 hours) at 07/21/2022 1213 Last data filed at 07/20/2022 2217 Gross per 24 hour  Intake 500 ml  Output --  Net 500 ml    Filed Weights   07/16/22 1528  Weight: 124.3 kg    Examination:  General exam: Appears in pain, has more swelling in the left mandibular area than yesterday.  Has dressing in place. Respiratory system: Clear to auscultation. Respiratory effort normal. Cardiovascular  system: S1 & S2 heard, RRR. No JVD, murmurs, rubs, gallops or clicks. No pedal edema. Gastrointestinal system: Abdomen is nondistended, soft and nontender. No organomegaly or masses felt. Normal bowel sounds heard. Central nervous system: Alert and oriented. No focal neurological deficits. Extremities: Symmetric 5 x 5 power. Skin: No rashes, lesions or ulcers.    Data Reviewed: I have personally reviewed following labs and imaging studies  CBC: Recent Labs  Lab 07/16/22 1525 07/17/22 0233 07/18/22 0320 07/19/22 0620 07/20/22 0827 07/21/22 0206  WBC 19.4* 23.9* 26.9* 20.2* 25.8* 26.1*  NEUTROABS 14.4*  --  24.4* 14.1* 21.9* 20.6*  HGB 11.7* 9.4* 9.8* 9.1* 10.0* 10.0*  HCT 37.8 30.4* 30.6* 28.0* 32.5* 30.7*  MCV 73.7* 74.3* 73.0* 71.6* 74.4* 71.9*  PLT 572*  459* 531* 492* 633* 565*    Basic Metabolic Panel: Recent Labs  Lab 07/17/22 0233 07/18/22 0320 07/19/22 0620 07/20/22 0827 07/21/22 0206  NA 134* 136 133* 136 132*  K 4.0 3.8 3.0* 3.2* 3.2*  CL 105 108 100 102 99  CO2 21* 19* 24 23 25   GLUCOSE 118* 140* 105* 139* 115*  BUN 7 8 8 7 7   CREATININE 0.78 0.73 0.86 0.95 0.75  CALCIUM 8.2* 8.8* 8.0* 8.7* 8.3*  MG 1.8 2.0 1.9  --   --   PHOS 2.8  --   --   --   --     GFR: Estimated Creatinine Clearance: 116.4 mL/min (by C-G formula based on SCr of 0.75 mg/dL). Liver Function Tests: Recent Labs  Lab 07/16/22 1525 07/18/22 0320  AST 14* 16  ALT 17 18  ALKPHOS 97 92  BILITOT 0.6 0.4  PROT 8.4* 7.0  ALBUMIN 4.2 2.7*    No results for input(s): "LIPASE", "AMYLASE" in the last 168 hours. No results for input(s): "AMMONIA" in the last 168 hours. Coagulation Profile: Recent Labs  Lab 07/16/22 1525  INR 1.0    Cardiac Enzymes: No results for input(s): "CKTOTAL", "CKMB", "CKMBINDEX", "TROPONINI" in the last 168 hours. BNP (last 3 results) No results for input(s): "PROBNP" in the last 8760 hours. HbA1C: No results for input(s): "HGBA1C" in the last 72  hours. CBG: No results for input(s): "GLUCAP" in the last 168 hours. Lipid Profile: No results for input(s): "CHOL", "HDL", "LDLCALC", "TRIG", "CHOLHDL", "LDLDIRECT" in the last 72 hours. Thyroid Function Tests: No results for input(s): "TSH", "T4TOTAL", "FREET4", "T3FREE", "THYROIDAB" in the last 72 hours. Anemia Panel: No results for input(s): "VITAMINB12", "FOLATE", "FERRITIN", "TIBC", "IRON", "RETICCTPCT" in the last 72 hours.  Sepsis Labs: Recent Labs  Lab 07/16/22 1525 07/16/22 1539 07/19/22 0620  PROCALCITON  --   --  <0.10  LATICACIDVEN 1.3 1.1  --      Recent Results (from the past 240 hour(s))  Culture, blood (Routine x 2)     Status: None   Collection Time: 07/16/22  3:20 PM   Specimen: BLOOD  Result Value Ref Range Status   Specimen Description   Final    BLOOD RIGHT ANTECUBITAL Performed at [facility], [address] [place], [place] [address]    Special Requests   Final    BOTTLES DRAWN AEROBIC AND ANAEROBIC Blood Culture adequate volume Performed at [facility], [address] [place], [place] [address]    Culture   Final    NO GROWTH 5 DAYS Performed at [facility], [address] [place], [place] [address]    Report Status 07/21/2022 FINAL  Final  Culture, blood (Routine x 2)     Status: None   Collection Time: 07/16/22  3:25 PM   Specimen: BLOOD  Result Value Ref Range Status   Specimen Description   Final    BLOOD LEFT ANTECUBITAL Performed at [facility], [address], [place], [place] [address]    Special Requests   Final    BOTTLES DRAWN AEROBIC AND ANAEROBIC Blood Culture adequate volume Performed at [facility], [address], [place], [place] [address]    Culture   Final    NO GROWTH 5 DAYS Performed at [facility], [address] [place], [place] [address]    Report Status 07/21/2022 FINAL  Final  Aerobic/Anaerobic Culture w Gram  Stain (surgical/deep wound)  Status: None (Preliminary result)   Collection Time: 07/19/22  1:00 PM   Specimen: Neck, Left; Abscess  Result Value Ref Range Status   Specimen Description ABSCESS LEFT NECK  Final   Special Requests NONE  Final   Gram Stain   Final    FEW WBC PRESENT,BOTH PMN AND MONONUCLEAR RARE GRAM POSITIVE COCCI    Culture   Final    CULTURE REINCUBATED FOR BETTER GROWTH Performed at [facility], [address] [place], [place] [address]    Report Status PENDING  Incomplete     Radiology Studies: No results found.  Scheduled Meds:  atorvastatin  20 mg Oral QHS   buPROPion  150 mg Oral q morning   dexamethasone (DECADRON) injection  8 mg Intravenous Q8H   DULoxetine  60 mg Oral Daily   enoxaparin (LOVENOX) injection  40 mg Subcutaneous Daily   ferrous sulfate  325 mg Oral Q breakfast   gabapentin  300 mg Oral TID   loratadine  10 mg Oral Daily   mesalamine  1.2 g Oral Daily   potassium chloride  40 mEq Oral Q4H   QUEtiapine  25 mg Oral TID   saccharomyces boulardii  250 mg Oral BID   topiramate  50 mg Oral Q12H   Continuous Infusions:  cefTRIAXone (ROCEPHIN)  IV 2 g (07/20/22 1155)   metronidazole 500 mg (07/21/22 1211)     LOS: 4 days   Hughie Closs, MD [facility]  07/21/2022, 12:13 PM   *Please note that this is a verbal dictation therefore any spelling or grammatical errors are due to the "[employer]agon [employer] One" system interpretation.  Please page via Amion and do not message via secure chat for urgent patient care matters. Secure chat can be used for non urgent patient care matters.  How to contact the [facility] Attending or Consulting provider 7A - 7P or covering provider during after hours 7P -7A, for this patient?  Check the care team in [facility] and look for a) attending/consulting [facility] provider listed and b) the [facility] team listed. Page or secure chat 7A-7P. Log into [email or web address] and use [place]'s universal password to access. If you  do not have the password, please contact the hospital operator. Locate the [facility] provider you are looking for under [facility] and page to a number that you can be directly reached. If you still have difficulty reaching the provider, please page the [facility] (Director on Call) for the Hospitalists listed on amion for assistance.

## 2022-07-21 NOTE — Consult Note (Addendum)
[facility]    Date of Admission:  07/16/2022   Total days of inpatient antibiotics 5        Reason for Consult:  Mandibular abscess   Principal Problem:   Oral infection Active Problems:   Bacterial oral infection   Hypokalemia   Assessment: 52 year old female who root canal and tooth extraction several weeks ago complicated with pain and swelling admitted with:   #Tooth 18 extraction complicated by mandibular abscess status post I&D on 5/8 #Extraction complicated by cellulitis as well - Initially patient had a infected tooth for which she was placed on clindamycin.  Few days later she underwent a root canal she noticed within a few days she had increased swelling and pain.  Tooth was extracted a few days later clindamycin continued.  After tooth extraction she noted significant pain, edema around left mandible.  Also noticed, bump under her tongue tongue and swelling around her ear.  - Arrival to ED WBC 19.4 K.  She was started on IV metronidazole and clindamycin.  ENT engaged as MRI showed large areas of inflammation at the left floor of mouth extending into Pharyngeal, submandibular mass to cater spaces.  3.2X 1.3 cm abscess deep to the left mandibular angle.  And arising from site of recent tooth 18 extraction.  Left facial cellulitis and myositis. - Underwent neck dissection with ENT on 5/8, or cultures on 5/8 showed rare GPC on Gram stain. significant pus noted per or note on dissection.  #Childhood penicillin allergy -Patient reports when she was 52 years old she was given a penicillin, she thinks it may have been Augmentin.  She developed a rash.  She denies any other penicillin use following this.  She had no shortness of breath or respiratory complaints following penicillin use at that time.   Recommendations:  -Amoxicillin challenge, patient is agreeable - Start ceftriaxone and vancomycin, continue metronidazole -Follow-up or  cultures  #Ulcerative colitis on mesalamine and Remicade - Management per primary, -   Patient's last Remicade dose was about 9 weeks ago.  She gets Remicade every 8 weeks.  She was counseled to hold Remicade for 7 weeks if she is ever on antibiotics.  Pt plans to letting her GI provider know about her recent infection.  Dr. Drue Second  will be covering this weekend. Microbiology:   Antibiotics: Clindamycin 5/5 - 5/6 Metronidazole 5/5, 5/7-present Ceftriaxone 5/7, 5/8 Cultures: Blood 5/5 no growth   Other 5/3 cultures reintubating, rare GPC on Gram stain  HPI: Rebekah Silva is a 52 y.o. female with past medical history of  morbid obesity, ulcerative colitis on methylamine, migraine headaches, chronic anxiety/depression, status post root canal about 2 weeks ago presented with tooth pain.  She saw dentist on Monday, July 01, 2022, medication adjusted.  On Friday 3 days prior to admission she felt a bump under her tongue compared to golf follow-up bump under her jaw and left ear.  Completed a course of p.o. clindamycin 4 times daily and a course of prednisone. [facility]BC 19.4 K.  CT soft tissue neck with contrast revealed inflammatory change at the floor of the mouth and left adjacent medial aspect body of mandible.  Presumably subsequent to recent extraction of tooth 18.  Enlargement of submandibular gland.  ENT engaged IV clinda and Flagyl thyroids for status post root canal extraction infection. Taken to the OR with Dr. Jearld Fenton, ENT on 5/8 noted MRI was subsequently  engageable as pain continued which showed a collection of fluid in the medial aspect mandible extending to anterior and posterior.  He went I&D of left neck abscess, ID engaged as Gram stain showing GPC.  Or findings were notable for significant pus on dissection. Review of Systems: Review of Systems  All other systems reviewed and are negative.   Past Medical History:  Diagnosis Date   Anxiety     Arthritis    ra   Colitis    Depression    GERD (gastroesophageal reflux disease)    Hypercholesterolemia    DR. WHITE   Migraines    PIH (pregnancy induced hypertension) yrs ago, none since   PONV (postoperative nausea and vomiting)    Wears glasses     Social History   Tobacco Use   Smoking status: Never   Smokeless tobacco: Never  Vaping Use   Vaping Use: Never used  Substance Use Topics   Alcohol use: Yes    Alcohol/week: 0.0 standard drinks of alcohol    Comment: occ   Drug use: Yes    Types: Marijuana    Family History  Problem Relation Age of Onset   Anxiety disorder Mother    Depression Mother    Breast cancer Mother        mastectomy- all through the ducts of left breast    Hypertension Father    Breast cancer Maternal Aunt    Cancer Maternal Grandfather        colon?   Diabetes Paternal Grandmother    Heart disease Paternal Grandmother    Scheduled Meds:  atorvastatin  20 mg Oral QHS   buPROPion  150 mg Oral q morning   dexamethasone (DECADRON) injection  8 mg Intravenous Q8H   DULoxetine  60 mg Oral Daily   enoxaparin (LOVENOX) injection  40 mg Subcutaneous Daily   ferrous sulfate  325 mg Oral Q breakfast   gabapentin  300 mg Oral TID   loratadine  10 mg Oral Daily   mesalamine  1.2 g Oral Daily   potassium chloride  40 mEq Oral Q4H   QUEtiapine  25 mg Oral TID   saccharomyces boulardii  250 mg Oral BID   topiramate  50 mg Oral Q12H   Continuous Infusions:  cefTRIAXone (ROCEPHIN)  IV 2 g (07/20/22 1155)   metronidazole 500 mg (07/20/22 2217)   PRN Meds:.acetaminophen, benzocaine, cyclobenzaprine, HYDROmorphone (DILAUDID) injection, hydrOXYzine, menthol-cetylpyridinium, ondansetron (ZOFRAN) IV, oxyCODONE, phenol, polyethylene glycol, prochlorperazine, SUMAtriptan Allergies  Allergen Reactions   Aspirin Other (See Comments)    Ulcerative colitis, abdominal pain   Ceftin [Cefuroxime] Other (See Comments)    Diarrhea, abdominal cramping    Codeine Other (See Comments)    Severe headaches   Lactose Other (See Comments)    Dairy - sets off my colitis   Other Rash and Other (See Comments)    Contact metal agents    Penicillins Rash and Other (See Comments)    OBJECTIVE: Blood pressure 113/73, pulse 95, temperature 98.5 F (36.9 C), temperature source Oral, resp. rate 12, height 5\' 7"  (1.702 m), weight 124.3 kg, SpO2 96 %.  Physical Exam Constitutional:      Appearance: Normal appearance.  HENT:     Head: Normocephalic and atraumatic.     Right Ear: Tympanic membrane normal.     Left Ear: Tympanic membrane normal.     Nose: Nose normal.     Mouth/Throat:     Mouth: Mucous membranes are moist.  Eyes:     Extraocular Movements: Extraocular movements intact.     Conjunctiva/sclera: Conjunctivae normal.     Pupils: Pupils are equal, round, and reactive to light.  Cardiovascular:     Rate and Rhythm: Normal rate and regular rhythm.     Heart sounds: No murmur heard.    No friction rub. No gallop.  Pulmonary:     Effort: Pulmonary effort is normal.     Breath sounds: Normal breath sounds.  Abdominal:     General: Abdomen is flat.     Palpations: Abdomen is soft.  Musculoskeletal:        General: Normal range of motion.  Skin:    General: Skin is warm and dry.  Neurological:     General: No focal deficit present.     Mental Status: Eevie Grella Maeda is alert and oriented to person, place, and time.  Psychiatric:        Mood and Affect: Mood normal.     Lab Results Lab Results  Component Value Date   WBC 26.1 (H) 07/21/2022   HGB 10.0 (L) 07/21/2022   HCT 30.7 (L) 07/21/2022   MCV 71.9 (L) 07/21/2022   PLT 565 (H) 07/21/2022    Lab Results  Component Value Date   CREATININE 0.75 07/21/2022   BUN 7 07/21/2022   NA 132 (L) 07/21/2022   K 3.2 (L) 07/21/2022   CL 99 07/21/2022   CO2 25 07/21/2022    Lab Results  Component Value Date   ALT 18 07/18/2022   AST 16 07/18/2022   ALKPHOS 92 07/18/2022    BILITOT 0.4 07/18/2022       Danelle Earthly, MD [facility] 07/21/2022, 12:00 PM   I have personally spent 100 minutes involved in face-to-face and non-face-to-face activities for this patient on the day of the visit. Professional time spent includes the following activities: Preparing to see the patient (review of tests), Obtaining and/or reviewing separately obtained history (admission/discharge record), Performing a medically appropriate examination and/or evaluation , Ordering medications/tests/procedures, referring and communicating with other health care professionals, Documenting clinical information in the EMR, Independently interpreting results (not separately reported), Communicating results to the patient/family/caregiver, Counseling and educating the patient/family/caregiver and Care coordination (not separately reported).

## 2022-07-21 NOTE — Progress Notes (Signed)
Patient ID: Rebekah Silva, female   DOB: 12/07/1970, 52 y.o.   MRN: [id number] She is worse. She is havingpain with swallowing and feels bad. She feels drainage in the thraot.   The neck is still firm and erythema of the skin. She is very tender. The floor of mouth does not have any swelling and tongue normal. There is swelling of the left soft palate and tonsil area.   She  has no fever and no growth on culture yet. I am off call and will need to get new on call DR to take over care.

## 2022-07-21 NOTE — Progress Notes (Signed)
ENT PROGRESS NOTE   Subjective: Patient seen and examined at bedside. Received handoff from Dr. Jearld Fenton this morning. Patient is experiencing significant pain and left facial swelling. She reports odynophagia is slightly improved, but she is still having pain with swallowing.   Objective: Vital signs in last 24 hours: Temp:  [97.4 F (36.3 C)-99 F (37.2 C)] 98.5 F (36.9 C) (05/10 0906) Pulse Rate:  [95-110] 95 (05/10 0906) Resp:  [12-20] 12 (05/10 0906) BP: (113-137)/(73-87) 113/73 (05/10 0906) SpO2:  [96 %-100 %] 96 % (05/10 0906)  CONSTITUTIONAL: well developed, nourished, no distress and alert and oriented x 3 PULMONARY/CHEST WALL:  no stridor, no stertor, no dysphonia HENT: Head : normocephalic and atraumatic Mouth/Throat:  Mouth: Edema of left soft palate and uvula noted. Floor of mouth soft. No intraoral purulence Throat: oropharynx clear and moist Mucous membranes: normal EYES: conjunctiva normal, EOM normal and PERRL NECK: Significant induration of left neck with no palpable fluctuance. Penrose drains in place in left neck, secured, with active drainage.   Recent Labs    07/20/22 0827 07/21/22 0206  NA 136 132*  K 3.2* 3.2*  CL 102 99  CO2 23 25  GLUCOSE 139* 115*  BUN 7 7  CREATININE 0.95 0.75  CALCIUM 8.7* 8.3*    Medications: I have reviewed the patient's current medications.  New Imaging: None. Previous imaging reviewed, CT neck and MRI  Culture:  Specimen Description ABSCESS LEFT NECK  Special Requests NONE  Gram Stain FEW WBC PRESENT,BOTH PMN AND MONONUCLEAR RARE GRAM POSITIVE COCCI  Culture CULTURE REINCUBATED FOR BETTER GROWTH Performed at [facility], [address], [place], [place] [address]  Report Status PENDING    Assessment/Plan: Rebekah Silva is a 52 y/o W with neck abscess secondary to odontogenic process. Patient's symptoms began following root canal and tooth extraction. She is now POD #2 s/p Incision and drainage by Dr.  Jearld Fenton with no significant improvement despite 2 JP drains in neck with active drainage and no palpable fluctuance on exam. -Recommend ID consult, current antibiotic regimen does not seem to be effective -Decadron 8mg  Q8h for 3 doses -Warm compresses to left neck -Recommend repeat CT neck tomorrow evening, NPO at midnight on Sunday -Will continue to follow     LOS: 4 days    Laren Boom, DO [facility] 07/21/2022, 12:00 PM

## 2022-07-22 DIAGNOSIS — K122 Cellulitis and abscess of mouth: Secondary | ICD-10-CM | POA: Diagnosis not present

## 2022-07-22 LAB — CBC WITH DIFFERENTIAL/PLATELET
Abs Immature Granulocytes: 0.23 10*3/uL — ABNORMAL HIGH (ref 0.00–0.07)
Basophils Absolute: 0 10*3/uL (ref 0.0–0.1)
Basophils Relative: 0 %
Eosinophils Absolute: 0 10*3/uL (ref 0.0–0.5)
Eosinophils Relative: 0 %
HCT: 29.2 % — ABNORMAL LOW (ref 36.0–46.0)
Hemoglobin: 9 g/dL — ABNORMAL LOW (ref 12.0–15.0)
Immature Granulocytes: 1 %
Lymphocytes Relative: 5 %
Lymphs Abs: 1.2 10*3/uL (ref 0.7–4.0)
MCH: 22.4 pg — ABNORMAL LOW (ref 26.0–34.0)
MCHC: 30.8 g/dL (ref 30.0–36.0)
MCV: 72.8 fL — ABNORMAL LOW (ref 80.0–100.0)
Monocytes Absolute: 0.6 10*3/uL (ref 0.1–1.0)
Monocytes Relative: 2 %
Neutro Abs: 24.2 10*3/uL — ABNORMAL HIGH (ref 1.7–7.7)
Neutrophils Relative %: 92 %
Platelets: 562 10*3/uL — ABNORMAL HIGH (ref 150–400)
RBC: 4.01 MIL/uL (ref 3.87–5.11)
RDW: 22.6 % — ABNORMAL HIGH (ref 11.5–15.5)
WBC: 26.2 10*3/uL — ABNORMAL HIGH (ref 4.0–10.5)
nRBC: 0 % (ref 0.0–0.2)

## 2022-07-22 LAB — BASIC METABOLIC PANEL
Anion gap: 8 (ref 5–15)
BUN: 10 mg/dL (ref 6–20)
CO2: 22 mmol/L (ref 22–32)
Calcium: 8.4 mg/dL — ABNORMAL LOW (ref 8.9–10.3)
Chloride: 106 mmol/L (ref 98–111)
Creatinine, Ser: 0.72 mg/dL (ref 0.44–1.00)
GFR, Estimated: >60 mL/min (ref >60)
Glucose, Bld: 129 mg/dL — ABNORMAL HIGH (ref 70–99)
Potassium: 4.1 mmol/L (ref 3.5–5.1)
Sodium: 136 mmol/L (ref 135–145)

## 2022-07-22 NOTE — Progress Notes (Signed)
ENT PROGRESS NOTE   Subjective: Patient seen and examined at bedside. She appears in significantly better spirits today. States pain and swelling are improved, but she noticed a bit more discomfort with swallowing earlier today. Tolerating soft diet and secretions without difficulty.  Objective: Vital signs in last 24 hours: Temp:  [98.2 F (36.8 C)-98.5 F (36.9 C)] 98.2 F (36.8 C) (05/11 1044) Pulse Rate:  [95-112] 112 (05/11 1044) Resp:  [16-18] 18 (05/11 1044) BP: (103-117)/(67-81) 110/81 (05/11 1044) SpO2:  [97 %-99 %] 99 % (05/11 1044)  CONSTITUTIONAL: well developed, nourished, no distress and alert and oriented x 3 PULMONARY/CHEST WALL:  no stridor, no stertor, no dysphonia HENT: Head : normocephalic and atraumatic Mouth/Throat:  Mouth: Edema of left soft palate and uvula has completely resolved, mild persistent edema of the left tonsil, without erythema or exudate. Floor of mouth soft. No intraoral purulence Throat: oropharynx clear and moist Mucous membranes: normal EYES: conjunctiva normal, EOM normal and PERRL NECK: Persistent induration of left neck with no palpable fluctuance, edema appreciably improved from previous exam. Penrose drains in place in left neck, secured, with active drainage.   Recent Labs    07/21/22 0206 07/22/22 0145  NA 132* 136  K 3.2* 4.1  CL 99 106  CO2 25 22  GLUCOSE 115* 129*  BUN 7 10  CREATININE 0.75 0.72  CALCIUM 8.3* 8.4*     Medications: I have reviewed the patient's current medications.  New Imaging: None. Previous imaging reviewed, CT neck and MRI  Culture:  Specimen Description ABSCESS LEFT NECK  Special Requests NONE  Gram Stain FEW WBC PRESENT,BOTH PMN AND MONONUCLEAR RARE GRAM POSITIVE COCCI  Culture CULTURE REINCUBATED FOR BETTER GROWTH Performed at [facility], [address], [place], [place] [address]  Report Status PENDING    Assessment/Plan: Rebekah Silva is a 52 y/o W with neck abscess  secondary to odontogenic process. Patient's symptoms began following root canal and tooth extraction. She is now POD #3 s/p Incision and drainage by Dr. Jearld Fenton. ID consulted yesterday, patient currently on Flagyl, Vancomycin and rocephin.  She appears significantly improved on exam today. Edema of soft palate noted yesterday has completely resolved, and neck edema is improving. Persistent induration of left neck noted without palpable fluctuance.   -Continue warm compresses to left neck -Repeat CT neck ordered for today, will follow up results. NPO at midnight tonight in case repeat washout indicated.    LOS: 5 days    Laren Boom, DO [facility] 07/22/2022, 3:39 PM

## 2022-07-22 NOTE — Progress Notes (Signed)
ID PROGRESS NOTE  Continue on vancomycin, metro and ctx, awaiting cx results that are growing GPCs. Has childhood allergy for pcn, that she has likely outgrown, will follow up on amox challenge to narrow abtx.  Duke Salvia Drue Second MD MPH [facility] [phone]

## 2022-07-22 NOTE — Plan of Care (Signed)

## 2022-07-22 NOTE — Progress Notes (Signed)
PROGRESS NOTE    Rebekah Silva  ZOX:[id number] DOB: 03-13-1971 DOA: 07/16/2022 PCP: Laurann Montana, MD   Brief Narrative:  Patient with PMH of ulcerative colitis on Remicade and Lialda, MDD, GAD presents to the hospital with complaints of left jaw pain. Had a root canal originally.Continue to have pain and swelling and therefore underwent tooth extraction 2 days ago, 5/3 outpatient. Had severe swelling of her left jaw with pain unable to open up her mouth and therefore was referred to ED. ED discussed with on-call dentist on 5/5.  Recommended that the patient does not need to see oral surgery. ENT consult appreciated currently no intervention recommended. Due to ongoing complaint of worsening left jaw pain MRI neck ordered on 07/18/2022 which showed large area of inflammation at the left floor of mouth, extending into the parapharyngeal, submandibular and masticator spaces. There is a 3.2 x 1.3 cm abscess deep to the left mandibular angle and arising from the site of recent tooth 18 extraction Assessment & Plan:   Principal Problem:   Oral infection Active Problems:   Bacterial oral infection   Hypokalemia  Sepsis ruled in, Sepsis secondary to oral infection after dental extraction, POA:  there was no abscess on the initial CT scan but MRI showed abscess deep to the left mandibular angle arising from the site of the recent tooth extraction along with inflammation on the floor of the mouth extending into the parapharyngeal and submandibular masticator spaces.  Patient underwent incision and drainage by Dr. Merceda Elks on 07/19/2022.  Patient initially improved but then got worse on 07/21/2022.  ID consulted per ENT recommendations, she is getting amoxicillin challenge.  She is feeling better today.  Although leukocytosis are elevated and stable but she is afebrile.  ENT is following and will likely get repeat imaging study today.  Hypokalemia: Resolved.   History of ulcerative colitis. On  mesalamine and Remicade. At risk for further worsening due to immunosuppression. Monitor closely.   Prolonged QTc. On admission QTc was 561.  Repeat EKG shows resolution of prolonged QTc.   Iron deficiency anemia: Iron studies indicate iron deficiency anemia.  Her hemoglobin last year was around 14, upon admission was 11.7 and now dropped to 9.0.  No obvious source of bleeding and no indication of transfusion.  Continue iron tablets.  Anxiety: On Wellbutrin and Cymbalta. Continue Seroquel, Topamax, Atarax, gabapentin.   HLD. Continue statin.  Mild hyponatremia: Stable.   Class III obesity Body mass index is 42.91 kg/m.  Placing the patient at high risk of poor outcome.  Diet modification and weight loss counseled.  DVT prophylaxis: enoxaparin (LOVENOX) injection 40 mg Start: 07/17/22 1000   Code Status: Full Code  Family Communication:  None present at bedside.  Plan of care discussed with patient in length and he/she verbalized understanding and agreed with it.  Status is: Inpatient Remains inpatient appropriate because: Will be discharged when cleared by ENT.     Estimated body mass index is 42.91 kg/m as calculated from the following:   Height as of this encounter: 5\' 7"  (1.702 m).   Weight as of this encounter: 124.3 kg.    Nutritional Assessment: Body mass index is 42.91 kg/m.Marland Kitchen Seen by dietician.  I agree with the assessment and plan as outlined below: Nutrition Status:        . Skin Assessment: I have examined the patient's skin and I agree with the wound assessment as performed by the wound care RN as outlined below:    Consultants:  ENT ID Procedures:  As above  Antimicrobials:  Anti-infectives (From admission, onward)    Start     Dose/Rate Route Frequency Ordered Stop   07/21/22 2300  vancomycin (VANCOREADY) IVPB 750 mg/150 mL        750 mg 150 mL/hr over 60 Minutes Intravenous Every 8 hours 07/21/22 1329     07/21/22 1500  vancomycin (VANCOCIN)  2,250 mg in sodium chloride 0.9 % 500 mL IVPB        2,250 mg 261.3 mL/hr over 120 Minutes Intravenous  Once 07/21/22 1329 07/21/22 1800   07/21/22 1445  amoxicillin (AMOXIL) capsule 500 mg        500 mg Oral  Once 07/21/22 1349 07/21/22 1529   07/18/22 1200  cefTRIAXone (ROCEPHIN) 2 g in sodium chloride 0.9 % 100 mL IVPB        2 g 200 mL/hr over 30 Minutes Intravenous Every 24 hours 07/18/22 0941     07/18/22 1000  metroNIDAZOLE (FLAGYL) IVPB 500 mg        500 mg 100 mL/hr over 60 Minutes Intravenous Every 12 hours 07/18/22 0941     07/17/22 2200  clindamycin (CLEOCIN) IVPB 300 mg  Status:  Discontinued        300 mg 100 mL/hr over 30 Minutes Intravenous Every 6 hours 07/17/22 1557 07/18/22 0941   07/17/22 0000  metroNIDAZOLE (FLAGYL) IVPB 500 mg  Status:  Discontinued        500 mg 100 mL/hr over 60 Minutes Intravenous Every 12 hours 07/16/22 2308 07/16/22 2309   07/16/22 1830  clindamycin (CLEOCIN) IVPB 600 mg  Status:  Discontinued        600 mg 100 mL/hr over 30 Minutes Intravenous Every 6 hours 07/16/22 1829 07/17/22 1521   07/16/22 1730  metroNIDAZOLE (FLAGYL) IVPB 500 mg        500 mg 100 mL/hr over 60 Minutes Intravenous  Once 07/16/22 1722 07/16/22 1926         Subjective: Patient seen and examined.  She says that her pain is much improved compared to yesterday.  Once again, she was smiling today as opposed to in tears that she was in yesterday.  No other complaint.  Objective: Vitals:   07/21/22 1630 07/21/22 1645 07/21/22 2015 07/22/22 0500  BP: 104/67 103/67 116/71 117/75  Pulse: 97 98 (!) 102 96  Resp: 18 18 16 18   Temp:   98.5 F (36.9 C) 98.2 F (36.8 C)  TempSrc:   Oral Oral  SpO2: 97% 97% 98% 99%  Weight:      Height:        Intake/Output Summary (Last 24 hours) at 07/22/2022 1035 Last data filed at 07/22/2022 0452 Gross per 24 hour  Intake 550.57 ml  Output --  Net 550.57 ml    Filed Weights   07/16/22 1528  Weight: 124.3 kg     Examination:  General exam: Appears calm and comfortable, has dressing in the left mandibular area. Respiratory system: Clear to auscultation. Respiratory effort normal. Cardiovascular system: S1 & S2 heard, RRR. No JVD, murmurs, rubs, gallops or clicks. No pedal edema. Gastrointestinal system: Abdomen is nondistended, soft and nontender. No organomegaly or masses felt. Normal bowel sounds heard. Central nervous system: Alert and oriented. No focal neurological deficits. Extremities: Symmetric 5 x 5 power. Skin: No rashes, lesions or ulcers.  Psychiatry: Judgement and insight appear normal. Mood & affect appropriate.   Data Reviewed: I have personally reviewed following labs and imaging studies  CBC: Recent Labs  Lab 07/18/22 0320 07/19/22 0620 07/20/22 0827 07/21/22 0206 07/22/22 0145  WBC 26.9* 20.2* 25.8* 26.1* 26.2*  NEUTROABS 24.4* 14.1* 21.9* 20.6* 24.2*  HGB 9.8* 9.1* 10.0* 10.0* 9.0*  HCT 30.6* 28.0* 32.5* 30.7* 29.2*  MCV 73.0* 71.6* 74.4* 71.9* 72.8*  PLT 531* 492* 633* 565* 562*    Basic Metabolic Panel: Recent Labs  Lab 07/17/22 0233 07/18/22 0320 07/19/22 0620 07/20/22 0827 07/21/22 0206 07/22/22 0145  NA 134* 136 133* 136 132* 136  K 4.0 3.8 3.0* 3.2* 3.2* 4.1  CL 105 108 100 102 99 106  CO2 21* 19* 24 23 25 22   GLUCOSE 118* 140* 105* 139* 115* 129*  BUN 7 8 8 7 7 10   CREATININE 0.78 0.73 0.86 0.95 0.75 0.72  CALCIUM 8.2* 8.8* 8.0* 8.7* 8.3* 8.4*  MG 1.8 2.0 1.9  --   --   --   PHOS 2.8  --   --   --   --   --     GFR: Estimated Creatinine Clearance: 116.4 mL/min (by C-G formula based on SCr of 0.72 mg/dL). Liver Function Tests: Recent Labs  Lab 07/16/22 1525 07/18/22 0320  AST 14* 16  ALT 17 18  ALKPHOS 97 92  BILITOT 0.6 0.4  PROT 8.4* 7.0  ALBUMIN 4.2 2.7*    No results for input(s): "LIPASE", "AMYLASE" in the last 168 hours. No results for input(s): "AMMONIA" in the last 168 hours. Coagulation Profile: Recent Labs  Lab  07/16/22 1525  INR 1.0    Cardiac Enzymes: No results for input(s): "CKTOTAL", "CKMB", "CKMBINDEX", "TROPONINI" in the last 168 hours. BNP (last 3 results) No results for input(s): "PROBNP" in the last 8760 hours. HbA1C: No results for input(s): "HGBA1C" in the last 72 hours. CBG: No results for input(s): "GLUCAP" in the last 168 hours. Lipid Profile: No results for input(s): "CHOL", "HDL", "LDLCALC", "TRIG", "CHOLHDL", "LDLDIRECT" in the last 72 hours. Thyroid Function Tests: No results for input(s): "TSH", "T4TOTAL", "FREET4", "T3FREE", "THYROIDAB" in the last 72 hours. Anemia Panel: No results for input(s): "VITAMINB12", "FOLATE", "FERRITIN", "TIBC", "IRON", "RETICCTPCT" in the last 72 hours.  Sepsis Labs: Recent Labs  Lab 07/16/22 1525 07/16/22 1539 07/19/22 0620  PROCALCITON  --   --  <0.10  LATICACIDVEN 1.3 1.1  --      Recent Results (from the past 240 hour(s))  Culture, blood (Routine x 2)     Status: None   Collection Time: 07/16/22  3:20 PM   Specimen: BLOOD  Result Value Ref Range Status   Specimen Description   Final    BLOOD RIGHT ANTECUBITAL Performed at [facility], [address], [place], [place] [address]    Special Requests   Final    BOTTLES DRAWN AEROBIC AND ANAEROBIC Blood Culture adequate volume Performed at [facility], [address] [place], [place] [address]    Culture   Final    NO GROWTH 5 DAYS Performed at [facility], [address] [place], [place] [address]    Report Status 07/21/2022 FINAL  Final  Culture, blood (Routine x 2)     Status: None   Collection Time: 07/16/22  3:25 PM   Specimen: BLOOD  Result Value Ref Range Status   Specimen Description   Final    BLOOD LEFT ANTECUBITAL Performed at [facility], [address] [place], [place] [address]    Special Requests   Final    BOTTLES DRAWN  AEROBIC AND ANAEROBIC Blood Culture adequate  volume Performed at [facility], [address] [place], [place] [address]    Culture   Final    NO GROWTH 5 DAYS Performed at [facility], [address] [place], [place] [address]    Report Status 07/21/2022 FINAL  Final  Aerobic/Anaerobic Culture w Gram Stain (surgical/deep wound)     Status: None (Preliminary result)   Collection Time: 07/19/22  1:00 PM   Specimen: Neck, Left; Abscess  Result Value Ref Range Status   Specimen Description ABSCESS LEFT NECK  Final   Special Requests NONE  Final   Gram Stain   Final    FEW WBC PRESENT,BOTH PMN AND MONONUCLEAR RARE GRAM POSITIVE COCCI Performed at [facility], [address] [place], [place] [address]    Culture   Final    CULTURE REINCUBATED FOR BETTER GROWTH NO ANAEROBES ISOLATED; CULTURE IN PROGRESS FOR 5 DAYS    Report Status PENDING  Incomplete     Radiology Studies: No results found.  Scheduled Meds:  atorvastatin  20 mg Oral QHS   buPROPion  150 mg Oral q morning   DULoxetine  60 mg Oral Daily   enoxaparin (LOVENOX) injection  40 mg Subcutaneous Daily   ferrous sulfate  325 mg Oral Q breakfast   gabapentin  300 mg Oral TID   loratadine  10 mg Oral Daily   mesalamine  1.2 g Oral Daily   QUEtiapine  25 mg Oral TID   saccharomyces boulardii  250 mg Oral BID   topiramate  50 mg Oral Q12H   Continuous Infusions:  cefTRIAXone (ROCEPHIN)  IV Stopped (07/21/22 1251)   metronidazole 500 mg (07/22/22 1021)   vancomycin 750 mg (07/22/22 0505)     LOS: 5 days   Hughie Closs, MD [facility]  07/22/2022, 10:35 AM   *Please note that this is a verbal dictation therefore any spelling or grammatical errors are due to the "[employer]agon [employer] One" system interpretation.  Please page via Amion and do not message via secure chat for urgent patient care matters. Secure chat can be used for non urgent patient care matters.  How to contact the [facility] Attending or Consulting provider 7A -  7P or covering provider during after hours 7P -7A, for this patient?  Check the care team in [facility] and look for a) attending/consulting [facility] provider listed and b) the [facility] team listed. Page or secure chat 7A-7P. Log into [email or web address] and use [facility]'s universal password to access. If you do not have the password, please contact the hospital operator. Locate the [facility] provider you are looking for under [facility] and page to a number that you can be directly reached. If you still have difficulty reaching the provider, please page the [facility] (Director on Call) for the Hospitalists listed on amion for assistance.

## 2022-07-23 ENCOUNTER — Inpatient Hospital Stay (HOSPITAL_COMMUNITY): Payer: BC Managed Care – PPO

## 2022-07-23 DIAGNOSIS — K122 Cellulitis and abscess of mouth: Secondary | ICD-10-CM | POA: Diagnosis not present

## 2022-07-23 LAB — CBC WITH DIFFERENTIAL/PLATELET
Abs Immature Granulocytes: 0.6 10*3/uL — ABNORMAL HIGH (ref 0.00–0.07)
Basophils Absolute: 0 10*3/uL (ref 0.0–0.1)
Basophils Relative: 0 %
Eosinophils Absolute: 0.3 10*3/uL (ref 0.0–0.5)
Eosinophils Relative: 1 %
HCT: 31.7 % — ABNORMAL LOW (ref 36.0–46.0)
Hemoglobin: 9.9 g/dL — ABNORMAL LOW (ref 12.0–15.0)
Lymphocytes Relative: 4 %
Lymphs Abs: 1.2 10*3/uL (ref 0.7–4.0)
MCH: 23 pg — ABNORMAL LOW (ref 26.0–34.0)
MCHC: 31.2 g/dL (ref 30.0–36.0)
MCV: 73.5 fL — ABNORMAL LOW (ref 80.0–100.0)
Metamyelocytes Relative: 2 %
Monocytes Absolute: 1.2 10*3/uL — ABNORMAL HIGH (ref 0.1–1.0)
Monocytes Relative: 4 %
Neutro Abs: 26.3 10*3/uL — ABNORMAL HIGH (ref 1.7–7.7)
Neutrophils Relative %: 89 %
Platelets: 652 10*3/uL — ABNORMAL HIGH (ref 150–400)
RBC: 4.31 MIL/uL (ref 3.87–5.11)
RDW: 22.6 % — ABNORMAL HIGH (ref 11.5–15.5)
WBC: 29.6 10*3/uL — ABNORMAL HIGH (ref 4.0–10.5)
nRBC: 0.1 % (ref 0.0–0.2)
nRBC: 1 /100 WBC — ABNORMAL HIGH

## 2022-07-23 LAB — BASIC METABOLIC PANEL
Anion gap: 12 (ref 5–15)
BUN: 16 mg/dL (ref 6–20)
CO2: 21 mmol/L — ABNORMAL LOW (ref 22–32)
Calcium: 8.5 mg/dL — ABNORMAL LOW (ref 8.9–10.3)
Chloride: 103 mmol/L (ref 98–111)
Creatinine, Ser: 0.85 mg/dL (ref 0.44–1.00)
GFR, Estimated: >60 mL/min (ref >60)
Glucose, Bld: 107 mg/dL — ABNORMAL HIGH (ref 70–99)
Potassium: 3.2 mmol/L — ABNORMAL LOW (ref 3.5–5.1)
Sodium: 136 mmol/L (ref 135–145)

## 2022-07-23 MED ORDER — IOHEXOL 350 MG/ML SOLN
75.0000 mL | Freq: Once | INTRAVENOUS | Status: AC | PRN
  Administered 2022-07-23: 75 mL via INTRAVENOUS

## 2022-07-23 MED ORDER — POTASSIUM CHLORIDE CRYS ER 20 MEQ PO TBCR
40.0000 meq | EXTENDED_RELEASE_TABLET | ORAL | Status: AC
  Administered 2022-07-23 (×2): 40 meq via ORAL
  Filled 2022-07-23 (×2): qty 2

## 2022-07-23 MED ORDER — POTASSIUM CHLORIDE 10 MEQ/100ML IV SOLN: 10.0000 meq | INTRAVENOUS | Status: DC

## 2022-07-23 NOTE — Progress Notes (Signed)
PROGRESS NOTE    Rebekah Silva  UUV:[id number] DOB: Feb 22, 1971 DOA: 07/16/2022 PCP: Laurann Montana, MD   Brief Narrative:  Patient with PMH of ulcerative colitis on Remicade and Lialda, MDD, GAD presents to the hospital with complaints of left jaw pain. Had a root canal originally.Continue to have pain and swelling and therefore underwent tooth extraction 2 days ago, 5/3 outpatient. Had severe swelling of her left jaw with pain unable to open up her mouth and therefore was referred to ED. ED discussed with on-call dentist on 5/5.  Recommended that the patient does not need to see oral surgery. ENT consult appreciated currently no intervention recommended. Due to ongoing complaint of worsening left jaw pain MRI neck ordered on 07/18/2022 which showed large area of inflammation at the left floor of mouth, extending into the parapharyngeal, submandibular and masticator spaces. There is a 3.2 x 1.3 cm abscess deep to the left mandibular angle and arising from the site of recent tooth 18 extraction Assessment & Plan:   Principal Problem:   Oral infection Active Problems:   Bacterial oral infection   Hypokalemia  Sepsis ruled in, Sepsis secondary to oral infection after dental extraction, POA:  there was no abscess on the initial CT scan but MRI showed abscess deep to the left mandibular angle arising from the site of the recent tooth extraction along with inflammation on the floor of the mouth extending into the parapharyngeal and submandibular masticator spaces.  Patient underwent incision and drainage by Dr. Merceda Elks on 07/19/2022.  Patient initially improved but then got worse on 07/21/2022.  ID consulted per ENT recommendations, she did get amoxicillin challenge.  Currently she is on Rocephin, Flagyl and vancomycin.  Although she says that her pain is slightly worse at the angle of the jaw but she appears comfortable and she has significant improvement in the swelling.  Has slightly worsened  leukocytosis but she is afebrile.  Repeat CT did not show any abscess.  ENT following and will defer to them for further management.   Hypokalemia: Low again.  Will replace.   History of ulcerative colitis. On mesalamine and Remicade. At risk for further worsening due to immunosuppression. Monitor closely.   Prolonged QTc. On admission QTc was 561.  Repeat EKG shows resolution of prolonged QTc.   Iron deficiency anemia: Iron studies indicate iron deficiency anemia.  Her hemoglobin last year was around 14, upon admission was 11.7 and now dropped to 9.0.  No obvious source of bleeding and no indication of transfusion.  Continue iron tablets.  Anxiety: On Wellbutrin and Cymbalta. Continue Seroquel, Topamax, Atarax, gabapentin.   HLD. Continue statin.  Mild hyponatremia: Stable.   Class III obesity Body mass index is 42.91 kg/m.  Placing the patient at high risk of poor outcome.  Diet modification and weight loss counseled.  DVT prophylaxis: enoxaparin (LOVENOX) injection 40 mg Start: 07/17/22 1000   Code Status: Full Code  Family Communication:  None present at bedside.  Plan of care discussed with patient in length and he/she verbalized understanding and agreed with it.  Status is: Inpatient Remains inpatient appropriate because: Will be discharged when cleared by ENT.     Estimated body mass index is 42.91 kg/m as calculated from the following:   Height as of this encounter: 5\' 7"  (1.702 m).   Weight as of this encounter: 124.3 kg.    Nutritional Assessment: Body mass index is 42.91 kg/m.Marland Kitchen Seen by dietician.  I agree with the assessment and plan as  outlined below: Nutrition Status:        . Skin Assessment: I have examined the patient's skin and I agree with the wound assessment as performed by the wound care RN as outlined below:    Consultants:  ENT ID Procedures:  As above  Antimicrobials:  Anti-infectives (From admission, onward)    Start      Dose/Rate Route Frequency Ordered Stop   07/21/22 2300  vancomycin (VANCOREADY) IVPB 750 mg/150 mL        750 mg 150 mL/hr over 60 Minutes Intravenous Every 8 hours 07/21/22 1329     07/21/22 1500  vancomycin (VANCOCIN) 2,250 mg in sodium chloride 0.9 % 500 mL IVPB        2,250 mg 261.3 mL/hr over 120 Minutes Intravenous  Once 07/21/22 1329 07/21/22 1800   07/21/22 1445  amoxicillin (AMOXIL) capsule 500 mg        500 mg Oral  Once 07/21/22 1349 07/21/22 1529   07/18/22 1200  cefTRIAXone (ROCEPHIN) 2 g in sodium chloride 0.9 % 100 mL IVPB        2 g 200 mL/hr over 30 Minutes Intravenous Every 24 hours 07/18/22 0941     07/18/22 1000  metroNIDAZOLE (FLAGYL) IVPB 500 mg        500 mg 100 mL/hr over 60 Minutes Intravenous Every 12 hours 07/18/22 0941     07/17/22 2200  clindamycin (CLEOCIN) IVPB 300 mg  Status:  Discontinued        300 mg 100 mL/hr over 30 Minutes Intravenous Every 6 hours 07/17/22 1557 07/18/22 0941   07/17/22 0000  metroNIDAZOLE (FLAGYL) IVPB 500 mg  Status:  Discontinued        500 mg 100 mL/hr over 60 Minutes Intravenous Every 12 hours 07/16/22 2308 07/16/22 2309   07/16/22 1830  clindamycin (CLEOCIN) IVPB 600 mg  Status:  Discontinued        600 mg 100 mL/hr over 30 Minutes Intravenous Every 6 hours 07/16/22 1829 07/17/22 1521   07/16/22 1730  metroNIDAZOLE (FLAGYL) IVPB 500 mg        500 mg 100 mL/hr over 60 Minutes Intravenous  Once 07/16/22 1722 07/16/22 1926         Subjective: Patient seen and examined.  She says that she has worsening pain of the left angle of the jaw but her swelling is significantly improved.  She has mild leukocytosis but clinically she is improving.  She did not complain of odynophagia today.  Objective: Vitals:   07/22/22 1706 07/22/22 2015 07/23/22 0400 07/23/22 0811  BP: 116/72 121/77 129/82 117/75  Pulse: (!) 103 97 95 88  Resp: 16 16 14 15   Temp: 98.5 F (36.9 C) 98.4 F (36.9 C) 98.4 F (36.9 C) 98.2 F (36.8 C)   TempSrc: Oral Oral Oral Oral  SpO2: 99% 99% 98% 99%  Weight:      Height:        Intake/Output Summary (Last 24 hours) at 07/23/2022 1001 Last data filed at 07/23/2022 0539 Gross per 24 hour  Intake 647.24 ml  Output --  Net 647.24 ml    Filed Weights   07/16/22 1528  Weight: 124.3 kg    Examination:  General exam: Appears calm and comfortable, mild swelling at the left mandibular area, has dressing in there. Respiratory system: Clear to auscultation. Respiratory effort normal. Cardiovascular system: S1 & S2 heard, RRR. No JVD, murmurs, rubs, gallops or clicks. No pedal edema. Gastrointestinal system: Abdomen is nondistended,  soft and nontender. No organomegaly or masses felt. Normal bowel sounds heard. Central nervous system: Alert and oriented. No focal neurological deficits. Extremities: Symmetric 5 x 5 power. Skin: No rashes, lesions or ulcers.  Psychiatry: Judgement and insight appear normal. Mood & affect appropriate.    Data Reviewed: I have personally reviewed following labs and imaging studies  CBC: Recent Labs  Lab 07/19/22 0620 07/20/22 0827 07/21/22 0206 07/22/22 0145 07/23/22 0530  WBC 20.2* 25.8* 26.1* 26.2* 29.6*  NEUTROABS 14.1* 21.9* 20.6* 24.2* 26.3*  HGB 9.1* 10.0* 10.0* 9.0* 9.9*  HCT 28.0* 32.5* 30.7* 29.2* 31.7*  MCV 71.6* 74.4* 71.9* 72.8* 73.5*  PLT 492* 633* 565* 562* 652*    Basic Metabolic Panel: Recent Labs  Lab 07/17/22 0233 07/18/22 0320 07/19/22 0620 07/20/22 0827 07/21/22 0206 07/22/22 0145 07/23/22 0530  NA 134* 136 133* 136 132* 136 136  K 4.0 3.8 3.0* 3.2* 3.2* 4.1 3.2*  CL 105 108 100 102 99 106 103  CO2 21* 19* 24 23 25 22  21*  GLUCOSE 118* 140* 105* 139* 115* 129* 107*  BUN 7 8 8 7 7 10 16   CREATININE 0.78 0.73 0.86 0.95 0.75 0.72 0.85  CALCIUM 8.2* 8.8* 8.0* 8.7* 8.3* 8.4* 8.5*  MG 1.8 2.0 1.9  --   --   --   --   PHOS 2.8  --   --   --   --   --   --     GFR: Estimated Creatinine Clearance: 109.6 mL/min  (by C-G formula based on SCr of 0.85 mg/dL). Liver Function Tests: Recent Labs  Lab 07/16/22 1525 07/18/22 0320  AST 14* 16  ALT 17 18  ALKPHOS 97 92  BILITOT 0.6 0.4  PROT 8.4* 7.0  ALBUMIN 4.2 2.7*    No results for input(s): "LIPASE", "AMYLASE" in the last 168 hours. No results for input(s): "AMMONIA" in the last 168 hours. Coagulation Profile: Recent Labs  Lab 07/16/22 1525  INR 1.0    Cardiac Enzymes: No results for input(s): "CKTOTAL", "CKMB", "CKMBINDEX", "TROPONINI" in the last 168 hours. BNP (last 3 results) No results for input(s): "PROBNP" in the last 8760 hours. HbA1C: No results for input(s): "HGBA1C" in the last 72 hours. CBG: No results for input(s): "GLUCAP" in the last 168 hours. Lipid Profile: No results for input(s): "CHOL", "HDL", "LDLCALC", "TRIG", "CHOLHDL", "LDLDIRECT" in the last 72 hours. Thyroid Function Tests: No results for input(s): "TSH", "T4TOTAL", "FREET4", "T3FREE", "THYROIDAB" in the last 72 hours. Anemia Panel: No results for input(s): "VITAMINB12", "FOLATE", "FERRITIN", "TIBC", "IRON", "RETICCTPCT" in the last 72 hours.  Sepsis Labs: Recent Labs  Lab 07/16/22 1525 07/16/22 1539 07/19/22 0620  PROCALCITON  --   --  <0.10  LATICACIDVEN 1.3 1.1  --      Recent Results (from the past 240 hour(s))  Culture, blood (Routine x 2)     Status: None   Collection Time: 07/16/22  3:20 PM   Specimen: BLOOD  Result Value Ref Range Status   Specimen Description   Final    BLOOD RIGHT ANTECUBITAL Performed at [facility], [address] [place], [place] [address]    Special Requests   Final    BOTTLES DRAWN AEROBIC AND ANAEROBIC Blood Culture adequate volume Performed at [facility], [address], [place], [place] [address]    Culture   Final    NO GROWTH 5 DAYS Performed at [facility], [address] [place], [place] [address]  Report Status 07/21/2022 FINAL  Final  Culture,  blood (Routine x 2)     Status: None   Collection Time: 07/16/22  3:25 PM   Specimen: BLOOD  Result Value Ref Range Status   Specimen Description   Final    BLOOD LEFT ANTECUBITAL Performed at [facility], [address] [place], [place] [address]    Special Requests   Final    BOTTLES DRAWN AEROBIC AND ANAEROBIC Blood Culture adequate volume Performed at [facility], [address] [place], [place] [address]    Culture   Final    NO GROWTH 5 DAYS Performed at [facility], [address] [place], [place] [address]    Report Status 07/21/2022 FINAL  Final  Aerobic/Anaerobic Culture w Gram Stain (surgical/deep wound)     Status: None (Preliminary result)   Collection Time: 07/19/22  1:00 PM   Specimen: Neck, Left; Abscess  Result Value Ref Range Status   Specimen Description ABSCESS LEFT NECK  Final   Special Requests NONE  Final   Gram Stain   Final    FEW WBC PRESENT,BOTH PMN AND MONONUCLEAR RARE GRAM POSITIVE COCCI Performed at [facility], [address] [place], [place] [address]    Culture   Final    RARE STREPTOCOCCUS INTERMEDIUS SUSCEPTIBILITIES TO FOLLOW NO ANAEROBES ISOLATED; CULTURE IN PROGRESS FOR 5 DAYS    Report Status PENDING  Incomplete     Radiology Studies: CT SOFT TISSUE NECK W CONTRAST  Result Date: 07/23/2022 CLINICAL DATA:  Tooth abscess.  Facial swelling despite I and D. EXAM: CT NECK WITH CONTRAST TECHNIQUE: Multidetector CT imaging of the neck was performed using the standard protocol following the bolus administration of intravenous contrast. RADIATION DOSE REDUCTION: This exam was performed according to the departmental dose-optimization program which includes automated exposure control, adjustment of the mA and/or kV according to patient size and/or use of iterative reconstruction technique. CONTRAST:  75mL OMNIPAQUE IOHEXOL 350 MG/ML SOLN COMPARISON:  Neck MRI 5 days ago and neck CT 7 days  ago FINDINGS: Pharynx and larynx: Mass effect on the pharynx from left parapharyngeal and masticator space inflammation. No submucosal edema Salivary glands: Expansion and edema involving the left submandibular gland, likely secondary. Thyroid: Normal Lymph nodes: None enlarged or abnormal density. Vascular: Unremarkable Limited intracranial: Negative Visualized orbits: Partial coverage is negative Mastoids and visualized paranasal sinuses: Clear Skeleton: Left lower molar extraction with gas in the alveolus consistent with recent surgery. There is a redundant red rubber catheter in the left submandibular and masticator spaces without residual collection. Regional cellulitis appears similar to prior MRI, with some mass effect on the root of the tongue. Upper chest: Negative IMPRESSION: Odontogenic cellulitis centered on the left jaw with drain. No residual collection. No noted progression since recent face MRI. Electronically Signed   By: Tiburcio Pea M.D.   On: 07/23/2022 05:20    Scheduled Meds:  atorvastatin  20 mg Oral QHS   buPROPion  150 mg Oral q morning   DULoxetine  60 mg Oral Daily   enoxaparin (LOVENOX) injection  40 mg Subcutaneous Daily   ferrous sulfate  325 mg Oral Q breakfast   gabapentin  300 mg Oral TID   loratadine  10 mg Oral Daily   mesalamine  1.2 g Oral Daily   potassium chloride  40 mEq Oral Q4H   QUEtiapine  25 mg Oral TID   saccharomyces boulardii  250 mg Oral BID  topiramate  50 mg Oral Q12H   Continuous Infusions:  cefTRIAXone (ROCEPHIN)  IV 2 g (07/22/22 1240)   metronidazole 500 mg (07/22/22 2143)   vancomycin 750 mg (07/23/22 0539)     LOS: 6 days   Hughie Closs, MD [facility]  07/23/2022, 10:01 AM   *Please note that this is a verbal dictation therefore any spelling or grammatical errors are due to the "Dragon [employer] One" system interpretation.  Please page via Amion and do not message via secure chat for urgent patient care matters. Secure  chat can be used for non urgent patient care matters.  How to contact the [facility] Attending or Consulting provider 7A - 7P or covering provider during after hours 7P -7A, for this patient?  Check the care team in [facility] and look for a) attending/consulting [facility] provider listed and b) the [facility] team listed. Page or secure chat 7A-7P. Log into [email or web address] and use [facility]'s universal password to access. If you do not have the password, please contact the hospital operator. Locate the [facility] provider you are looking for under [facility] and page to a number that you can be directly reached. If you still have difficulty reaching the provider, please page the [facility] (Director on Call) for the Hospitalists listed on amion for assistance.

## 2022-07-23 NOTE — Progress Notes (Signed)
ENT PROGRESS NOTE   Subjective: Patient seen and examined at bedside.  Patient notes continued concern regarding pain and swelling of the face. Tolerating soft diet and secretions without difficulty.  CT neck with contrast was performed earlier this morning.  Objective: Vital signs in last 24 hours: Temp:  [98.2 F (36.8 C)-98.5 F (36.9 C)] 98.2 F (36.8 C) (05/12 0811) Pulse Rate:  [88-103] 88 (05/12 0811) Resp:  [14-16] 15 (05/12 0811) BP: (116-129)/(72-82) 117/75 (05/12 0811) SpO2:  [98 %-99 %] 99 % (05/12 0811)  CONSTITUTIONAL: well developed, nourished, no distress and alert and oriented x 3 PULMONARY/CHEST WALL:  no stridor, no stertor, no dysphonia HENT: Head : normocephalic and atraumatic Mouth/Throat:  Mouth: Edema of left soft palate and uvula has completely resolved, edema of the left tonsil is also improved without erythema or exudate. Floor of mouth soft. No intraoral purulence Throat: oropharynx clear and moist Mucous membranes: normal EYES: conjunctiva normal, EOM normal and PERRL NECK: Persistent mild induration of left neck with no palpable fluctuance, edema appreciably improved from previous exam. Penrose drains in place in left neck, secured, with active drainage.   Recent Labs    07/22/22 0145 07/23/22 0530  NA 136 136  K 4.1 3.2*  CL 106 103  CO2 22 21*  GLUCOSE 129* 107*  BUN 10 16  CREATININE 0.72 0.85  CALCIUM 8.4* 8.5*     Medications: I have reviewed the patient's current medications.  New Imaging: CT Neck with contrast reviewed.  Drains in place, in good position.  No new or persistent focal fluid collection noted on imaging.  Significant fat stranding and soft tissue edema consistent with cellulitis.  CLINICAL DATA:  Tooth abscess.  Facial swelling despite I and D.   EXAM: CT NECK WITH CONTRAST   TECHNIQUE: Multidetector CT imaging of the neck was performed using the standard protocol following the bolus administration of  intravenous contrast.   RADIATION DOSE REDUCTION: This exam was performed according to the departmental dose-optimization program which includes automated exposure control, adjustment of the mA and/or kV according to patient size and/or use of iterative reconstruction technique.   CONTRAST:  75mL OMNIPAQUE IOHEXOL 350 MG/ML SOLN   COMPARISON:  Neck MRI 5 days ago and neck CT 7 days ago   FINDINGS: Pharynx and larynx: Mass effect on the pharynx from left parapharyngeal and masticator space inflammation. No submucosal edema   Salivary glands: Expansion and edema involving the left submandibular gland, likely secondary.   Thyroid: Normal   Lymph nodes: None enlarged or abnormal density.   Vascular: Unremarkable   Limited intracranial: Negative   Visualized orbits: Partial coverage is negative   Mastoids and visualized paranasal sinuses: Clear   Skeleton: Left lower molar extraction with gas in the alveolus consistent with recent surgery. There is a redundant red rubber catheter in the left submandibular and masticator spaces without residual collection. Regional cellulitis appears similar to prior MRI, with some mass effect on the root of the tongue.   Upper chest: Negative   IMPRESSION: Odontogenic cellulitis centered on the left jaw with drain. No residual collection. No noted progression since recent face MRI.  Culture:  Specimen Description ABSCESS LEFT NECK  Special Requests NONE  Gram Stain FEW WBC PRESENT,BOTH PMN AND MONONUCLEAR RARE GRAM POSITIVE COCCI  Culture CULTURE REINCUBATED FOR BETTER GROWTH Performed at [facility], [address] [place], [place] [address]  Report Status PENDING    Assessment/Plan: Rebekah Silva is a 52 y/o  W with neck abscess secondary to odontogenic process. Patient's symptoms began following root canal and tooth extraction. She is now POD #4 s/p Incision and drainage by Dr. Jearld Fenton. ID consulted yesterday, patient  currently on Flagyl, Vancomycin and rocephin.  Patient continues to have appreciable improvement on physical exam.  Facial edema is noticeably better, with persistent drainage from Penrose drains.  Counseled patient that CT neck does not reveal any evidence of persistent or new fluid collection, and with appropriate placement/patency of drains, additional washout at this point will not be significantly impactful.  -Continue warm compresses to left neck -Patient would likely benefit from additional dose of Decadron to improve cellulitic edema, however leukocytosis is still artificially increased from previous doses, therefore will defer to primary team. -Anticipate drains to stay in place until Tuesday based on continued drainage.  Will plan on removal early afternoon, with hopeful discharge later that day on oral antibiotics, pending continued clinical improvement.   LOS: 6 days    Laren Boom, DO [facility] 07/23/2022, 11:48 AM

## 2022-07-24 DIAGNOSIS — K122 Cellulitis and abscess of mouth: Secondary | ICD-10-CM | POA: Diagnosis not present

## 2022-07-24 DIAGNOSIS — K5190 Ulcerative colitis, unspecified, without complications: Secondary | ICD-10-CM | POA: Diagnosis not present

## 2022-07-24 LAB — BASIC METABOLIC PANEL
Anion gap: 5 (ref 5–15)
BUN: 14 mg/dL (ref 6–20)
CO2: 23 mmol/L (ref 22–32)
Calcium: 7.9 mg/dL — ABNORMAL LOW (ref 8.9–10.3)
Chloride: 107 mmol/L (ref 98–111)
Creatinine, Ser: 0.83 mg/dL (ref 0.44–1.00)
GFR, Estimated: >60 mL/min (ref >60)
Glucose, Bld: 82 mg/dL (ref 70–99)
Potassium: 3.9 mmol/L (ref 3.5–5.1)
Sodium: 135 mmol/L (ref 135–145)

## 2022-07-24 LAB — CBC WITH DIFFERENTIAL/PLATELET
Abs Immature Granulocytes: 0.15 10*3/uL — ABNORMAL HIGH (ref 0.00–0.07)
Basophils Absolute: 0 10*3/uL (ref 0.0–0.1)
Basophils Relative: 0 %
Eosinophils Absolute: 0.3 10*3/uL (ref 0.0–0.5)
Eosinophils Relative: 2 %
HCT: 29.4 % — ABNORMAL LOW (ref 36.0–46.0)
Hemoglobin: 8.9 g/dL — ABNORMAL LOW (ref 12.0–15.0)
Immature Granulocytes: 1 %
Lymphocytes Relative: 25 %
Lymphs Abs: 5 10*3/uL — ABNORMAL HIGH (ref 0.7–4.0)
MCH: 22.5 pg — ABNORMAL LOW (ref 26.0–34.0)
MCHC: 30.3 g/dL (ref 30.0–36.0)
MCV: 74.2 fL — ABNORMAL LOW (ref 80.0–100.0)
Monocytes Absolute: 1.6 10*3/uL — ABNORMAL HIGH (ref 0.1–1.0)
Monocytes Relative: 8 %
Neutro Abs: 12.5 10*3/uL — ABNORMAL HIGH (ref 1.7–7.7)
Neutrophils Relative %: 64 %
Platelets: 598 10*3/uL — ABNORMAL HIGH (ref 150–400)
RBC: 3.96 MIL/uL (ref 3.87–5.11)
RDW: 22.7 % — ABNORMAL HIGH (ref 11.5–15.5)
WBC: 19.5 10*3/uL — ABNORMAL HIGH (ref 4.0–10.5)
nRBC: 0.3 % — ABNORMAL HIGH (ref 0.0–0.2)

## 2022-07-24 LAB — AEROBIC/ANAEROBIC CULTURE W GRAM STAIN (SURGICAL/DEEP WOUND)

## 2022-07-24 MED ORDER — SODIUM CHLORIDE 0.9 % IV SOLN
8.0000 mg/kg | Freq: Every day | INTRAVENOUS | Status: DC
  Administered 2022-07-24 – 2022-07-26 (×3): 700 mg via INTRAVENOUS
  Filled 2022-07-24 (×4): qty 14

## 2022-07-24 MED ORDER — AMOXICILLIN-POT CLAVULANATE 875-125 MG PO TABS
1.0000 | ORAL_TABLET | Freq: Two times a day (BID) | ORAL | Status: DC
  Administered 2022-07-24 – 2022-07-27 (×7): 1 via ORAL
  Filled 2022-07-24 (×8): qty 1

## 2022-07-24 NOTE — Progress Notes (Addendum)
[facility]  Date of Admission:  07/16/2022   Total days of inpatient antibiotics 7  Principal Problem:   Oral infection Active Problems:   Bacterial oral infection   Hypokalemia          Assessment: 52 year old female who root canal and tooth extraction several weeks ago complicated with pain and swelling admitted with:     #Tooth 18 extraction complicated by mandibular abscess status post I&D on 5/8 with Cx+ Strep intemedius #Extraction complicated by cellulitis as well - Initially patient had a infected tooth for which she was placed on clindamycin.  Few days later she underwent a root canal she noticed within a few days she had increased swelling and pain.  Tooth was extracted a few days later clindamycin continued.  After tooth extraction she noted significant pain, edema around left mandible.  Also noticed, bump under her tongue tongue and swelling around her ear.  - Arrival to ED WBC 19.4 K.  She was started on IV metronidazole and clindamycin.  ENT engaged as MRI showed large areas of inflammation at the left floor of mouth extending into Pharyngeal, submandibular mass to cater spaces.  3.2X 1.3 cm abscess deep to the left mandibular angle.  And arising from site of recent tooth 18 extraction.  Left facial cellulitis and myositis. - Underwent neck dissection with ENT on 5/8, or cultures on 5/8 showed rare GPC on Gram stain. significant pus noted per or note on dissection. Plan: - Discontinue metronidazole and ceftriaxone -Given patient was on clindamycin for almost a month prior to I&D will continue IV therapy, transition bank to daptomycin.Aadd Augmentin for gram-negative and anaerobic coverage.  Opted for IV therapy instead of p.o. alone given extent of swelling and continued drainage. -Follow surgical plan with ENT  #Childhood penicillin allergy  -tolerated amoxicillin challenge  #Ulcerative colitis on mesalamine and Remicade - Management  per primary, -   Patient's last Remicade dose was about 9 weeks ago.  She gets Remicade every 8 weeks.  She was counseled to hold Remicade for 7 weeks if she is ever on antibiotics.  Pt plans to letting her GI provider know about her recent infection.  Microbiology:   Antibiotics: Clindamycin 5/5 - 5/6 Metronidazole 5/5, 5/7-present Ceftriaxone 5/7, 5/8 Cultures: Blood 5/5 no growth     Other 5/3 cultures strep inermedius   SUBJECTIVE: Resting bed. Reprots increased drainage overnight.  Interval: Afebrile overnight. Wbc 19.5k  Review of Systems: Review of Systems  All other systems reviewed and are negative.    Scheduled Meds:  amoxicillin-clavulanate  1 tablet Oral Q12H   atorvastatin  20 mg Oral QHS   buPROPion  150 mg Oral q morning   DULoxetine  60 mg Oral Daily   enoxaparin (LOVENOX) injection  40 mg Subcutaneous Daily   ferrous sulfate  325 mg Oral Q breakfast   gabapentin  300 mg Oral TID   loratadine  10 mg Oral Daily   mesalamine  1.2 g Oral Daily   QUEtiapine  25 mg Oral TID   saccharomyces boulardii  250 mg Oral BID   topiramate  50 mg Oral Q12H   Continuous Infusions:  DAPTOmycin (CUBICIN) 700 mg in sodium chloride 0.9 % IVPB     PRN Meds:.acetaminophen, benzocaine, cyclobenzaprine, diphenhydrAMINE, EPINEPHrine, HYDROmorphone (DILAUDID) injection, hydrOXYzine, menthol-cetylpyridinium, ondansetron (ZOFRAN) IV, oxyCODONE, phenol, polyethylene glycol, prochlorperazine, SUMAtriptan Allergies  Allergen Reactions   Aspirin Other (See Comments)    Ulcerative  colitis, abdominal pain   Ceftin [Cefuroxime] Other (See Comments)    Diarrhea, abdominal cramping   Codeine Other (See Comments)    Severe headaches   Lactose Other (See Comments)    Dairy - sets off my colitis   Other Rash and Other (See Comments)    Contact metal agents     OBJECTIVE: Vitals:   07/23/22 1435 07/23/22 2113 07/24/22 0443 07/24/22 0757  BP: 97/60 120/74 119/78 100/62  Pulse: (!)  105 (!) 101 87 85  Resp: 15 18 20    Temp: 98.4 F (36.9 C) 99.1 F (37.3 C) 98 F (36.7 C)   TempSrc: Oral Oral Oral   SpO2: 98% 100% 99% 97%  Weight:      Height:       Body mass index is 42.91 kg/m.  Physical Exam Constitutional:      Appearance: Normal appearance.  HENT:     Head: Normocephalic and atraumatic.     Right Ear: Tympanic membrane normal.     Left Ear: Tympanic membrane normal.     Nose: Nose normal.     Mouth/Throat:     Mouth: Mucous membranes are moist.  Eyes:     Extraocular Movements: Extraocular movements intact.     Conjunctiva/sclera: Conjunctivae normal.     Pupils: Pupils are equal, round, and reactive to light.  Cardiovascular:     Rate and Rhythm: Normal rate and regular rhythm.     Heart sounds: No murmur heard.    No friction rub. No gallop.  Pulmonary:     Effort: Pulmonary effort is normal.     Breath sounds: Normal breath sounds.  Abdominal:     General: Abdomen is flat.     Palpations: Abdomen is soft.  Musculoskeletal:        General: Normal range of motion.  Skin:    General: Skin is warm and dry.  Neurological:     General: No focal deficit present.     Mental Status: Rebekah Silva is alert and oriented to person, place, and time.  Psychiatric:        Mood and Affect: Mood normal.     Neck wound with drain, swelling contienued  Lab Results Lab Results  Component Value Date   WBC 19.5 (H) 07/24/2022   HGB 8.9 (L) 07/24/2022   HCT 29.4 (L) 07/24/2022   MCV 74.2 (L) 07/24/2022   PLT 598 (H) 07/24/2022    Lab Results  Component Value Date   CREATININE 0.83 07/24/2022   BUN 14 07/24/2022   NA 135 07/24/2022   K 3.9 07/24/2022   CL 107 07/24/2022   CO2 23 07/24/2022    Lab Results  Component Value Date   ALT 18 07/18/2022   AST 16 07/18/2022   ALKPHOS 92 07/18/2022   BILITOT 0.4 07/18/2022        Danelle Earthly, MD [facility] 07/24/2022, 3:30 PM   I have personally spent 53 minutes involved in face-to-face and non-face-to-face activities for this patient on the day of the visit. Professional time spent includes the following activities: Preparing to see the patient (review of tests), Obtaining and/or reviewing separately obtained history (admission/discharge record), Performing a medically appropriate examination and/or evaluation , Ordering medications/tests/procedures, referring and communicating with other health care professionals, Documenting clinical information in the EMR, Independently interpreting results (not separately reported), Communicating results to the patient/family/caregiver, Counseling and educating the patient/family/caregiver and Care coordination (not separately reported).

## 2022-07-24 NOTE — Progress Notes (Signed)
PROGRESS NOTE    Rebekah Silva  ZOX:[id number] DOB: [birth year] DOA: 07/16/2022 PCP: Laurann Montana, MD   Brief Narrative:  Patient with PMH of ulcerative colitis on Remicade and Lialda, MDD, GAD presents to the hospital with complaints of left jaw pain. Had a root canal originally.Continue to have pain and swelling and therefore underwent tooth extraction 2 days ago, 5/3 outpatient. Had severe swelling of her left jaw with pain unable to open up her mouth and therefore was referred to ED. ED discussed with on-call dentist on 5/5.  Recommended that the patient does not need to see oral surgery. ENT consult appreciated currently no intervention recommended. Due to ongoing complaint of worsening left jaw pain MRI neck ordered on 07/18/2022 which showed large area of inflammation at the left floor of mouth, extending into the parapharyngeal, submandibular and masticator spaces. There is a 3.2 x 1.3 cm abscess deep to the left mandibular angle and arising from the site of recent tooth 18 extraction Assessment & Plan:   Principal Problem:   Oral infection Active Problems:   Bacterial oral infection   Hypokalemia  Sepsis ruled in, Sepsis secondary to oral infection after dental extraction, POA:  there was no abscess on the initial CT scan but MRI showed abscess deep to the left mandibular angle arising from the site of the recent tooth extraction along with inflammation on the floor of the mouth extending into the parapharyngeal and submandibular masticator spaces.  Patient underwent incision and drainage by Dr. Merceda Elks on 07/19/2022.  Patient initially improved but then got worse on 07/21/2022.  ID consulted per ENT recommendations, she did get amoxicillin challenge.  Currently she is on Rocephin, Flagyl and vancomycin. Repeat CT did not show any abscess. She still complains of pain at the left which is not improving but her swelling visibly appears significantly improved.  Leukocytosis improved as well  and she remains afebrile.  ENT following and management per them.  Although she says that her pain is slightly worse at the angle of the jaw but she appears comfortable and she has significant improvement in the swelling.  Has slightly worsened leukocytosis but she is afebrile.  Marland Kitchen  ENT following and will defer to them for further management.   Hypokalemia: Resolved.   History of ulcerative colitis. On mesalamine and Remicade. At risk for further worsening due to immunosuppression. Monitor closely.   Prolonged QTc. On admission QTc was 561.  Repeat EKG shows resolution of prolonged QTc.   Iron deficiency anemia: Iron studies indicate iron deficiency anemia.  Her hemoglobin last year was around 14, upon admission was 11.7 and now dropped to 9.0.  No obvious source of bleeding and no indication of transfusion.  Continue iron tablets.  Anxiety: On Wellbutrin and Cymbalta. Continue Seroquel, Topamax, Atarax, gabapentin.   HLD. Continue statin.  Mild hyponatremia: Stable.   Class III obesity Body mass index is 42.91 kg/m.  Placing the patient at high risk of poor outcome.  Diet modification and weight loss counseled.  DVT prophylaxis: enoxaparin (LOVENOX) injection 40 mg Start: 07/17/22 1000   Code Status: Full Code  Family Communication:  None present at bedside.  Plan of care discussed with patient in length and he/she verbalized understanding and agreed with it.  Status is: Inpatient Remains inpatient appropriate because: Will be discharged when cleared by ENT.     Estimated body mass index is 42.91 kg/m as calculated from the following:   Height as of this encounter: 5\' 7"  (1.702 m).  Weight as of this encounter: 124.3 kg.    Nutritional Assessment: Body mass index is 42.91 kg/m.Marland Kitchen Seen by dietician.  I agree with the assessment and plan as outlined below: Nutrition Status:        . Skin Assessment: I have examined the patient's skin and I agree with the wound  assessment as performed by the wound care RN as outlined below:    Consultants:  ENT ID Procedures:  As above  Antimicrobials:  Anti-infectives (From admission, onward)    Start     Dose/Rate Route Frequency Ordered Stop   07/21/22 2300  vancomycin (VANCOREADY) IVPB 750 mg/150 mL        750 mg 150 mL/hr over 60 Minutes Intravenous Every 8 hours 07/21/22 1329     07/21/22 1500  vancomycin (VANCOCIN) 2,250 mg in sodium chloride 0.9 % 500 mL IVPB        2,250 mg 261.3 mL/hr over 120 Minutes Intravenous  Once 07/21/22 1329 07/21/22 1800   07/21/22 1445  amoxicillin (AMOXIL) capsule 500 mg        500 mg Oral  Once 07/21/22 1349 07/21/22 1529   07/18/22 1200  cefTRIAXone (ROCEPHIN) 2 g in sodium chloride 0.9 % 100 mL IVPB        2 g 200 mL/hr over 30 Minutes Intravenous Every 24 hours 07/18/22 0941     07/18/22 1000  metroNIDAZOLE (FLAGYL) IVPB 500 mg        500 mg 100 mL/hr over 60 Minutes Intravenous Every 12 hours 07/18/22 0941     07/17/22 2200  clindamycin (CLEOCIN) IVPB 300 mg  Status:  Discontinued        300 mg 100 mL/hr over 30 Minutes Intravenous Every 6 hours 07/17/22 1557 07/18/22 0941   07/17/22 0000  metroNIDAZOLE (FLAGYL) IVPB 500 mg  Status:  Discontinued        500 mg 100 mL/hr over 60 Minutes Intravenous Every 12 hours 07/16/22 2308 07/16/22 2309   07/16/22 1830  clindamycin (CLEOCIN) IVPB 600 mg  Status:  Discontinued        600 mg 100 mL/hr over 30 Minutes Intravenous Every 6 hours 07/16/22 1829 07/17/22 1521   07/16/22 1730  metroNIDAZOLE (FLAGYL) IVPB 500 mg        500 mg 100 mL/hr over 60 Minutes Intravenous  Once 07/16/22 1722 07/16/22 1926         Subjective: Seen and examined.  Complains of pain in the left jaw, slightly better than yesterday.  No other complaint.  No pain with swallowing today.  Objective: Vitals:   07/23/22 1435 07/23/22 2113 07/24/22 0443 07/24/22 0757  BP: 97/60 120/74 119/78 100/62  Pulse: (!) 105 (!) 101 87 85  Resp: 15 18  20    Temp: 98.4 F (36.9 C) 99.1 F (37.3 C) 98 F (36.7 C)   TempSrc: Oral Oral Oral   SpO2: 98% 100% 99% 97%  Weight:      Height:        Intake/Output Summary (Last 24 hours) at 07/24/2022 0906 Last data filed at 07/23/2022 2126 Gross per 24 hour  Intake 400 ml  Output --  Net 400 ml    Filed Weights   07/16/22 1528  Weight: 124.3 kg    Examination:  General exam: Appears calm and comfortable, mild swelling (although much improved) of the left mandibular area, dressing in place. Respiratory system: Clear to auscultation. Respiratory effort normal. Cardiovascular system: S1 & S2 heard, RRR. No JVD, murmurs,  rubs, gallops or clicks. No pedal edema. Gastrointestinal system: Abdomen is nondistended, soft and nontender. No organomegaly or masses felt. Normal bowel sounds heard. Central nervous system: Alert and oriented. No focal neurological deficits. Extremities: Symmetric 5 x 5 power. Skin: No rashes, lesions or ulcers.  Psychiatry: Judgement and insight appear normal. Mood & affect appropriate.    Data Reviewed: I have personally reviewed following labs and imaging studies  CBC: Recent Labs  Lab 07/20/22 0827 07/21/22 0206 07/22/22 0145 07/23/22 0530 07/24/22 0225  WBC 25.8* 26.1* 26.2* 29.6* 19.5*  NEUTROABS 21.9* 20.6* 24.2* 26.3* 12.5*  HGB 10.0* 10.0* 9.0* 9.9* 8.9*  HCT 32.5* 30.7* 29.2* 31.7* 29.4*  MCV 74.4* 71.9* 72.8* 73.5* 74.2*  PLT 633* 565* 562* 652* 598*    Basic Metabolic Panel: Recent Labs  Lab 07/18/22 0320 07/19/22 0620 07/20/22 0827 07/21/22 0206 07/22/22 0145 07/23/22 0530 07/24/22 0225  NA 136 133* 136 132* 136 136 135  K 3.8 3.0* 3.2* 3.2* 4.1 3.2* 3.9  CL 108 100 102 99 106 103 107  CO2 19* 24 23 25 22  21* 23  GLUCOSE 140* 105* 139* 115* 129* 107* 82  BUN 8 8 7 7 10 16 14   CREATININE 0.73 0.86 0.95 0.75 0.72 0.85 0.83  CALCIUM 8.8* 8.0* 8.7* 8.3* 8.4* 8.5* 7.9*  MG 2.0 1.9  --   --   --   --   --     GFR: Estimated  Creatinine Clearance: 112.2 mL/min (by C-G formula based on SCr of 0.83 mg/dL). Liver Function Tests: Recent Labs  Lab 07/18/22 0320  AST 16  ALT 18  ALKPHOS 92  BILITOT 0.4  PROT 7.0  ALBUMIN 2.7*    No results for input(s): "LIPASE", "AMYLASE" in the last 168 hours. No results for input(s): "AMMONIA" in the last 168 hours. Coagulation Profile: No results for input(s): "INR", "PROTIME" in the last 168 hours.  Cardiac Enzymes: No results for input(s): "CKTOTAL", "CKMB", "CKMBINDEX", "TROPONINI" in the last 168 hours. BNP (last 3 results) No results for input(s): "PROBNP" in the last 8760 hours. HbA1C: No results for input(s): "HGBA1C" in the last 72 hours. CBG: No results for input(s): "GLUCAP" in the last 168 hours. Lipid Profile: No results for input(s): "CHOL", "HDL", "LDLCALC", "TRIG", "CHOLHDL", "LDLDIRECT" in the last 72 hours. Thyroid Function Tests: No results for input(s): "TSH", "T4TOTAL", "FREET4", "T3FREE", "THYROIDAB" in the last 72 hours. Anemia Panel: No results for input(s): "VITAMINB12", "FOLATE", "FERRITIN", "TIBC", "IRON", "RETICCTPCT" in the last 72 hours.  Sepsis Labs: Recent Labs  Lab 07/19/22 0620  PROCALCITON <0.10     Recent Results (from the past 240 hour(s))  Culture, blood (Routine x 2)     Status: None   Collection Time: 07/16/22  3:20 PM   Specimen: BLOOD  Result Value Ref Range Status   Specimen Description   Final    BLOOD RIGHT ANTECUBITAL Performed at [facility], [address], [place], [place] [address]    Special Requests   Final    BOTTLES DRAWN AEROBIC AND ANAEROBIC Blood Culture adequate volume Performed at [facility], [address], [place], [place] [address]    Culture   Final    NO GROWTH 5 DAYS Performed at [facility], [address] [place], [place] [address]    Report Status 07/21/2022 FINAL  Final  Culture, blood (Routine x 2)     Status: None   Collection  Time: 07/16/22  3:25 PM   Specimen:  BLOOD  Result Value Ref Range Status   Specimen Description   Final    BLOOD LEFT ANTECUBITAL Performed at [facility], [address] [place], [place] [address]    Special Requests   Final    BOTTLES DRAWN AEROBIC AND ANAEROBIC Blood Culture adequate volume Performed at [facility], [address], [place], [place] [address]    Culture   Final    NO GROWTH 5 DAYS Performed at [facility], [address], [place], [place] [address]    Report Status 07/21/2022 FINAL  Final  Aerobic/Anaerobic Culture w Gram Stain (surgical/deep wound)     Status: None (Preliminary result)   Collection Time: 07/19/22  1:00 PM   Specimen: Neck, Left; Abscess  Result Value Ref Range Status   Specimen Description ABSCESS LEFT NECK  Final   Special Requests NONE  Final   Gram Stain   Final    FEW WBC PRESENT,BOTH PMN AND MONONUCLEAR RARE GRAM POSITIVE COCCI Performed at [facility], [address], [place], [place] [address]    Culture   Final    RARE STREPTOCOCCUS INTERMEDIUS NO ANAEROBES ISOLATED; CULTURE IN PROGRESS FOR 5 DAYS    Report Status PENDING  Incomplete   Organism ID, Bacteria STREPTOCOCCUS INTERMEDIUS  Final      Susceptibility   Streptococcus intermedius - MIC*    PENICILLIN <=0.06 SENSITIVE Sensitive     CEFTRIAXONE <=0.12 SENSITIVE Sensitive     ERYTHROMYCIN >=8 RESISTANT Resistant     LEVOFLOXACIN <=0.25 SENSITIVE Sensitive     VANCOMYCIN 0.5 SENSITIVE Sensitive     * RARE STREPTOCOCCUS INTERMEDIUS     Radiology Studies: CT SOFT TISSUE NECK W CONTRAST  Result Date: 07/23/2022 CLINICAL DATA:  Tooth abscess.  Facial swelling despite I and D. EXAM: CT NECK WITH CONTRAST TECHNIQUE: Multidetector CT imaging of the neck was performed using the standard protocol following the bolus administration of intravenous contrast. RADIATION DOSE REDUCTION: This exam was performed according to the  departmental dose-optimization program which includes automated exposure control, adjustment of the mA and/or kV according to patient size and/or use of iterative reconstruction technique. CONTRAST:  75mL OMNIPAQUE IOHEXOL 350 MG/ML SOLN COMPARISON:  Neck MRI 5 days ago and neck CT 7 days ago FINDINGS: Pharynx and larynx: Mass effect on the pharynx from left parapharyngeal and masticator space inflammation. No submucosal edema Salivary glands: Expansion and edema involving the left submandibular gland, likely secondary. Thyroid: Normal Lymph nodes: None enlarged or abnormal density. Vascular: Unremarkable Limited intracranial: Negative Visualized orbits: Partial coverage is negative Mastoids and visualized paranasal sinuses: Clear Skeleton: Left lower molar extraction with gas in the alveolus consistent with recent surgery. There is a redundant red rubber catheter in the left submandibular and masticator spaces without residual collection. Regional cellulitis appears similar to prior MRI, with some mass effect on the root of the tongue. Upper chest: Negative IMPRESSION: Odontogenic cellulitis centered on the left jaw with drain. No residual collection. No noted progression since recent face MRI. Electronically Signed   By: Tiburcio Pea M.D.   On: 07/23/2022 05:20    Scheduled Meds:  atorvastatin  20 mg Oral QHS   buPROPion  150 mg Oral q morning   DULoxetine  60 mg Oral Daily   enoxaparin (LOVENOX) injection  40 mg Subcutaneous Daily   ferrous sulfate  325 mg Oral Q breakfast   gabapentin  300 mg Oral TID   loratadine  10 mg Oral Daily  mesalamine  1.2 g Oral Daily   QUEtiapine  25 mg Oral TID   saccharomyces boulardii  250 mg Oral BID   topiramate  50 mg Oral Q12H   Continuous Infusions:  cefTRIAXone (ROCEPHIN)  IV Stopped (07/23/22 1213)   metronidazole 500 mg (07/24/22 1610)   vancomycin 750 mg (07/24/22 9604)     LOS: 7 days   Hughie Closs, MD [facility]  07/24/2022, 9:06 AM    *Please note that this is a verbal dictation therefore any spelling or grammatical errors are due to the "[employer]agon [employer] One" system interpretation.  Please page via Amion and do not message via secure chat for urgent patient care matters. Secure chat can be used for non urgent patient care matters.  How to contact the [facility] Attending or Consulting provider 7A - 7P or covering provider during after hours 7P -7A, for this patient?  Check the care team in [facility] and look for a) attending/consulting [facility] provider listed and b) the [facility] team listed. Page or secure chat 7A-7P. Log into [email or web address] and use [place]'s universal password to access. If you do not have the password, please contact the hospital operator. Locate the [facility] provider you are looking for under [facility] and page to a number that you can be directly reached. If you still have difficulty reaching the provider, please page the [facility] (Director on Call) for the Hospitalists listed on amion for assistance.

## 2022-07-24 NOTE — Progress Notes (Signed)
Pharmacy Note- Penicillin Allergy Clarification   ASSESSMENT: 52 year old female that reports a rash to a penicillin antibiotic when she was 52 years old. She cannot remember which penicillin the allergy occurred to but it did not involve airway compromise.    PEN-FAST Scoring   Five years or less since last reaction 0  Anaphylaxis/Angioedema OR Severe cutaneous adverse reaction  0  Treatment required for reaction  0  Total Score 0 points - Very low risk of positive penicillin allergy test (<1%)      Type of intervention (select all that apply):  Amoxicillin Oral Challenge     PLAN: - amoxicillin 500 mg PO x1  - Q52min vitals monitoring x 1 hour  - Epi-Pen PRN  - IV diphenhydramine PRN  - plan communicated to primary RN    Post-challenge follow-up: 5/13: Patient reports no issues with amoxicillin challenge- Deleted penicillin allergy and optimized antibiotic therapy to Augmentin  + Daptomycin. Patient was counseled that she is no longer allergic to penicillin. I called the patient's pharmacy to remove the allergy from her profile.    Sharin Mons, PharmD, BCPS, BCIDP Infectious Diseases Clinical Pharmacist Phone: [phone] 07/24/2022 12:12 PM

## 2022-07-24 NOTE — TOC Initial Note (Signed)
Transition of Care [facility]) - Initial/Assessment Note    Patient Details  Name: Rebekah Silva MRN: [id number] Date of Birth: 1970/03/20  Transition of Care [facility]) CM/SW Contact:    Harriet Masson, RN Phone Number: 07/24/2022, 2:30 PM  Clinical Narrative:                 Spoke to patient regarding transition needs.  Plan for patient to discharge on IV antibiotics.  Pam with [facility] is following.  Choice offered for home health and patient deferred to this RNCM to find agency that is in-network with insurance.  Cory with [facility] accepted referral.  TOC will continue to follow for needs.  Expected Discharge Plan: Home w Home Health Services Barriers to Discharge: Continued Medical Work up   Patient Goals and CMS Choice Patient states their goals for this hospitalization and ongoing recovery are:: return home CMS Medicare.gov Compare Post Acute Care list provided to:: Patient Choice offered to / list presented to : Patient      Expected Discharge Plan and Services     Post Acute Care Choice: Home Health Living arrangements for the past 2 months: Single Family Home                           HH Arranged: RN HH Agency: [facility] Date [facility] Agency Contacted: 07/24/22 Time HH Agency Contacted: 1430 Representative spoke with at [facility] Agency: Kandee Keen  Prior Living Arrangements/Services Living arrangements for the past 2 months: Single Family Home Lives with:: Spouse Patient language and need for interpreter reviewed:: Yes Do you feel safe going back to the place where you live?: Yes      Need for Family Participation in Patient Care: Yes (Comment) Care giver support system in place?: Yes (comment)   Criminal Activity/Legal Involvement Pertinent to Current Situation/Hospitalization: No - Comment as needed  Activities of Daily Living Home Assistive Devices/Equipment: None ADL Screening (condition at time of admission) Patient's cognitive ability adequate to  safely complete daily activities?: Yes Is the patient deaf or have difficulty hearing?: No Does the patient have difficulty seeing, even when wearing glasses/contacts?: No Does the patient have difficulty concentrating, remembering, or making decisions?: No Patient able to express need for assistance with ADLs?: No Does the patient have difficulty dressing or bathing?: No Independently performs ADLs?: Yes (appropriate for developmental age) Does the patient have difficulty walking or climbing stairs?: No Weakness of Legs: None Weakness of Arms/Hands: None  Permission Sought/Granted                  Emotional Assessment Appearance:: Appears stated age Attitude/Demeanor/Rapport: Gracious Affect (typically observed): Accepting Orientation: : Oriented to Self, Oriented to  Time, Oriented to Situation, Oriented to Place Alcohol / Substance Use: Not Applicable Psych Involvement: No (comment)  Admission diagnosis:  Hypokalemia [E87.6] Oral infection [K12.2] Neck swelling [R22.1] Patient Active Problem List   Diagnosis Date Noted   Hypokalemia 07/20/2022   Bacterial oral infection 07/16/2022   Oral infection 07/16/2022   Generalized anxiety disorder 06/06/2021   MDD (major depressive disorder), recurrent severe, 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 -  [phone] [place] [address] Phone: [phone] Fax: [phone]     Social Determinants of Health (SDOH) Social History: SDOH Screenings   Food Insecurity: No Food Insecurity (07/22/2022)  Housing: Low Risk  (07/22/2022)   Transportation Needs: No Transportation Needs (07/22/2022)  Utilities: Not At Risk (07/22/2022)  Alcohol Screen: Low Risk  (05/26/2021)  Depression (PHQ2-9): High Risk (07/05/2021)  Tobacco Use: Low Risk  (07/20/2022)   SDOH Interventions:     Readmission Risk Interventions     No data to display

## 2022-07-25 DIAGNOSIS — K5190 Ulcerative colitis, unspecified, without complications: Secondary | ICD-10-CM | POA: Diagnosis not present

## 2022-07-25 DIAGNOSIS — A419 Sepsis, unspecified organism: Secondary | ICD-10-CM | POA: Insufficient documentation

## 2022-07-25 DIAGNOSIS — K122 Cellulitis and abscess of mouth: Secondary | ICD-10-CM | POA: Diagnosis not present

## 2022-07-25 LAB — CBC WITH DIFFERENTIAL/PLATELET
Abs Immature Granulocytes: 0 10*3/uL (ref 0.00–0.07)
Basophils Absolute: 0.4 10*3/uL — ABNORMAL HIGH (ref 0.0–0.1)
Basophils Relative: 2 %
Eosinophils Absolute: 1 10*3/uL — ABNORMAL HIGH (ref 0.0–0.5)
Eosinophils Relative: 5 %
HCT: 32.6 % — ABNORMAL LOW (ref 36.0–46.0)
Hemoglobin: 9.8 g/dL — ABNORMAL LOW (ref 12.0–15.0)
Lymphocytes Relative: 19 %
Lymphs Abs: 3.6 10*3/uL (ref 0.7–4.0)
MCH: 22.6 pg — ABNORMAL LOW (ref 26.0–34.0)
MCHC: 30.1 g/dL (ref 30.0–36.0)
MCV: 75.3 fL — ABNORMAL LOW (ref 80.0–100.0)
Monocytes Absolute: 0.8 10*3/uL (ref 0.1–1.0)
Monocytes Relative: 4 %
Neutro Abs: 13.4 10*3/uL — ABNORMAL HIGH (ref 1.7–7.7)
Neutrophils Relative %: 70 %
Platelets: 639 10*3/uL — ABNORMAL HIGH (ref 150–400)
RBC: 4.33 MIL/uL (ref 3.87–5.11)
RDW: 22.9 % — ABNORMAL HIGH (ref 11.5–15.5)
WBC: 19.1 10*3/uL — ABNORMAL HIGH (ref 4.0–10.5)
nRBC: 0 /100 WBC
nRBC: 0.1 % (ref 0.0–0.2)

## 2022-07-25 LAB — BASIC METABOLIC PANEL
Anion gap: 9 (ref 5–15)
BUN: 11 mg/dL (ref 6–20)
CO2: 24 mmol/L (ref 22–32)
Calcium: 8.2 mg/dL — ABNORMAL LOW (ref 8.9–10.3)
Chloride: 101 mmol/L (ref 98–111)
Creatinine, Ser: 0.92 mg/dL (ref 0.44–1.00)
GFR, Estimated: >60 mL/min (ref >60)
Glucose, Bld: 129 mg/dL — ABNORMAL HIGH (ref 70–99)
Potassium: 3.2 mmol/L — ABNORMAL LOW (ref 3.5–5.1)
Sodium: 134 mmol/L — ABNORMAL LOW (ref 135–145)

## 2022-07-25 LAB — CK: Total CK: 36 U/L — ABNORMAL LOW (ref 38–234)

## 2022-07-25 NOTE — Progress Notes (Signed)
PHARMACY CONSULT NOTE FOR:  OUTPATIENT  PARENTERAL ANTIBIOTIC THERAPY (OPAT)  Indication: Mandibular abscess  Regimen: Daptomycin 700 mg IV every 24 hours  End date: 09/01/22  IV antibiotic discharge orders are pended. To discharging provider:  please sign these orders via discharge navigator,  Select New Orders & click on the button choice - Manage This Unsigned Work.     Thank you for allowing pharmacy to be a part of this patient's care.  Sharin Mons, PharmD, BCPS, BCIDP Infectious Diseases Clinical Pharmacist Phone: [phone] 07/25/2022, 1:21 PM

## 2022-07-25 NOTE — Progress Notes (Signed)
PROGRESS NOTE    Rebekah Silva  WRU:[id number] DOB: September 16, 1970 DOA: 07/16/2022 PCP: Laurann Montana, MD   Brief Narrative:  Patient with PMH of ulcerative colitis on Remicade and Lialda, MDD, GAD presented to the hospital with complaints of left jaw pain. Had a root canal originally.Continued to have pain and swelling and therefore underwent tooth extraction 5/3 outpatient. Had severe swelling of her left jaw with pain unable to open up her mouth and therefore was referred to ED. ED discussed with on-call dentist on 5/5.  Recommended that the patient does not need to see oral surgery.  Patient had CT which was negative for abscess.  ENT saw patient. Due to ongoing complaint of worsening left jaw pain MRI neck ordered on 07/18/2022 which showed large area of inflammation at the left floor of mouth, extending into the parapharyngeal, submandibular and masticator spaces.  Underwent IND.  Further details below.  Assessment & Plan:   Principal Problem:   Oral infection Active Problems:   Bacterial oral infection   Hypokalemia   Sepsis (HCC)  Sepsis ruled in, Sepsis secondary to oral infection after dental extraction, POA:  there was no abscess on the initial CT scan but MRI showed abscess deep to the left mandibular angle arising from the site of the recent tooth extraction along with inflammation on the floor of the mouth extending into the parapharyngeal and submandibular masticator spaces.  Patient underwent incision and drainage by Dr. Merceda Elks on 07/19/2022.  Patient initially improved but then got worse on 07/21/2022.  ID consulted per ENT recommendations, she did get amoxicillin challenge (due to childhood penicillin allergy).  She was then sent to Rocephin, Flagyl and vancomycin and eventually transition to oral Augmentin and IV daptomycin on 07/24/2022. Repeat CT did not show any abscess. She still complains of pain at the left which is slightly worse than yesterday but she is able to open her  mouth a little more.  Visibly, her swelling at the left mandibular area has improved significantly.  She is still has 2 drains in there.  ENT is following.  Waiting for further recommendations from them.  Patient slightly uncomfortable going home today due to worsened pain.    Hypokalemia: Resolved.  Labs pending this morning.   History of ulcerative colitis. On mesalamine and Remicade. At risk for further worsening due to immunosuppression. Monitor closely.   Prolonged QTc. On admission QTc was 561.  Repeat EKG shows resolution of prolonged QTc.   Iron deficiency anemia: Iron studies indicate iron deficiency anemia.  Her hemoglobin last year was around 14, upon admission was 11.7 and now dropped to 8.9.  No obvious source of bleeding and no indication of transfusion.  Continue iron tablets.  Anxiety: On Wellbutrin and Cymbalta. Continue Seroquel, Topamax, Atarax, gabapentin.   HLD. Continue statin.  Mild hyponatremia: Stable.   Class III obesity Body mass index is 42.91 kg/m.  Placing the patient at high risk of poor outcome.  Diet modification and weight loss counseled.  DVT prophylaxis: enoxaparin (LOVENOX) injection 40 mg Start: 07/17/22 1000   Code Status: Full Code  Family Communication:  None present at bedside.  Plan of care discussed with patient in length and he/she verbalized understanding and agreed with it.  Status is: Inpatient Remains inpatient appropriate because: Worsened pain.  Waiting for ENT evaluation.   Estimated body mass index is 42.91 kg/m as calculated from the following:   Height as of this encounter: 5\' 7"  (1.702 m).   Weight as of  this encounter: 124.3 kg.    Nutritional Assessment: Body mass index is 42.91 kg/m.Marland Kitchen Seen by dietician.  I agree with the assessment and plan as outlined below: Nutrition Status:        . Skin Assessment: I have examined the patient's skin and I agree with the wound assessment as performed by the wound care RN  as outlined below:    Consultants:  ENT ID Procedures:  As above  Antimicrobials:  Anti-infectives (From admission, onward)    Start     Dose/Rate Route Frequency Ordered Stop   07/24/22 1400  DAPTOmycin (CUBICIN) 700 mg in sodium chloride 0.9 % IVPB        8 mg/kg  86.7 kg (Adjusted) 128 mL/hr over 30 Minutes Intravenous Daily 07/24/22 1210     07/24/22 1045  amoxicillin-clavulanate (AUGMENTIN) 875-125 MG per tablet 1 tablet        1 tablet Oral Every 12 hours 07/24/22 0955     07/21/22 2300  vancomycin (VANCOREADY) IVPB 750 mg/150 mL  Status:  Discontinued        750 mg 150 mL/hr over 60 Minutes Intravenous Every 8 hours 07/21/22 1329 07/24/22 1210   07/21/22 1500  vancomycin (VANCOCIN) 2,250 mg in sodium chloride 0.9 % 500 mL IVPB        2,250 mg 261.3 mL/hr over 120 Minutes Intravenous  Once 07/21/22 1329 07/21/22 1800   07/21/22 1445  amoxicillin (AMOXIL) capsule 500 mg        500 mg Oral  Once 07/21/22 1349 07/21/22 1529   07/18/22 1200  cefTRIAXone (ROCEPHIN) 2 g in sodium chloride 0.9 % 100 mL IVPB  Status:  Discontinued        2 g 200 mL/hr over 30 Minutes Intravenous Every 24 hours 07/18/22 0941 07/24/22 0955   07/18/22 1000  metroNIDAZOLE (FLAGYL) IVPB 500 mg  Status:  Discontinued        500 mg 100 mL/hr over 60 Minutes Intravenous Every 12 hours 07/18/22 0941 07/24/22 0955   07/17/22 2200  clindamycin (CLEOCIN) IVPB 300 mg  Status:  Discontinued        300 mg 100 mL/hr over 30 Minutes Intravenous Every 6 hours 07/17/22 1557 07/18/22 0941   07/17/22 0000  metroNIDAZOLE (FLAGYL) IVPB 500 mg  Status:  Discontinued        500 mg 100 mL/hr over 60 Minutes Intravenous Every 12 hours 07/16/22 2308 07/16/22 2309   07/16/22 1830  clindamycin (CLEOCIN) IVPB 600 mg  Status:  Discontinued        600 mg 100 mL/hr over 30 Minutes Intravenous Every 6 hours 07/16/22 1829 07/17/22 1521   07/16/22 1730  metroNIDAZOLE (FLAGYL) IVPB 500 mg        500 mg 100 mL/hr over 60 Minutes  Intravenous  Once 07/16/22 1722 07/16/22 1926         Subjective: Patient seen and examined.  She complains of worsening left jaw pain.  Mild pain with swallowing as well.  No other complaint.  Objective: Vitals:   07/24/22 1553 07/24/22 2043 07/25/22 0529 07/25/22 0819  BP: 123/71 110/70 110/76 99/62  Pulse: 95 95 93 98  Resp: 15 20 20    Temp:  98.3 F (36.8 C) 98 F (36.7 C) 98.4 F (36.9 C)  TempSrc:   Oral Oral  SpO2: 97% 97% 100% 99%  Weight:      Height:       No intake or output data in the 24 hours ending 07/25/22  1610  Filed Weights   07/16/22 1528  Weight: 124.3 kg    Examination:  General exam: Appears calm and comfortable, obese, has dressing at the left mandibular area with 2 Penrose drains Respiratory system: Clear to auscultation. Respiratory effort normal. Cardiovascular system: S1 & S2 heard, RRR. No JVD, murmurs, rubs, gallops or clicks. No pedal edema. Gastrointestinal system: Abdomen is nondistended, soft and nontender. No organomegaly or masses felt. Normal bowel sounds heard. Central nervous system: Alert and oriented. No focal neurological deficits. Extremities: Symmetric 5 x 5 power. Skin: No rashes, lesions or ulcers.  Psychiatry: Judgement and insight appear normal. Mood & affect appropriate.   Data Reviewed: I have personally reviewed following labs and imaging studies  CBC: Recent Labs  Lab 07/20/22 0827 07/21/22 0206 07/22/22 0145 07/23/22 0530 07/24/22 0225  WBC 25.8* 26.1* 26.2* 29.6* 19.5*  NEUTROABS 21.9* 20.6* 24.2* 26.3* 12.5*  HGB 10.0* 10.0* 9.0* 9.9* 8.9*  HCT 32.5* 30.7* 29.2* 31.7* 29.4*  MCV 74.4* 71.9* 72.8* 73.5* 74.2*  PLT 633* 565* 562* 652* 598*   Basic Metabolic Panel: Recent Labs  Lab 07/19/22 0620 07/20/22 0827 07/21/22 0206 07/22/22 0145 07/23/22 0530 07/24/22 0225  NA 133* 136 132* 136 136 135  K 3.0* 3.2* 3.2* 4.1 3.2* 3.9  CL 100 102 99 106 103 107  CO2 24 23 25 22  21* 23  GLUCOSE 105* 139*  115* 129* 107* 82  BUN 8 7 7 10 16 14   CREATININE 0.86 0.95 0.75 0.72 0.85 0.83  CALCIUM 8.0* 8.7* 8.3* 8.4* 8.5* 7.9*  MG 1.9  --   --   --   --   --    GFR: Estimated Creatinine Clearance: 112.2 mL/min (by C-G formula based on SCr of 0.83 mg/dL). Liver Function Tests: No results for input(s): "AST", "ALT", "ALKPHOS", "BILITOT", "PROT", "ALBUMIN" in the last 168 hours.  No results for input(s): "LIPASE", "AMYLASE" in the last 168 hours. No results for input(s): "AMMONIA" in the last 168 hours. Coagulation Profile: No results for input(s): "INR", "PROTIME" in the last 168 hours.  Cardiac Enzymes: No results for input(s): "CKTOTAL", "CKMB", "CKMBINDEX", "TROPONINI" in the last 168 hours. BNP (last 3 results) No results for input(s): "PROBNP" in the last 8760 hours. HbA1C: No results for input(s): "HGBA1C" in the last 72 hours. CBG: No results for input(s): "GLUCAP" in the last 168 hours. Lipid Profile: No results for input(s): "CHOL", "HDL", "LDLCALC", "TRIG", "CHOLHDL", "LDLDIRECT" in the last 72 hours. Thyroid Function Tests: No results for input(s): "TSH", "T4TOTAL", "FREET4", "T3FREE", "THYROIDAB" in the last 72 hours. Anemia Panel: No results for input(s): "VITAMINB12", "FOLATE", "FERRITIN", "TIBC", "IRON", "RETICCTPCT" in the last 72 hours.  Sepsis Labs: Recent Labs  Lab 07/19/22 0620  PROCALCITON <0.10    Recent Results (from the past 240 hour(s))  Culture, blood (Routine x 2)     Status: None   Collection Time: 07/16/22  3:20 PM   Specimen: BLOOD  Result Value Ref Range Status   Specimen Description   Final    BLOOD RIGHT ANTECUBITAL Performed at [facility], [address], [place], [place] [address]    Special Requests   Final    BOTTLES DRAWN AEROBIC AND ANAEROBIC Blood Culture adequate volume Performed at [facility], [address] [place], [place] [address]    Culture   Final    NO GROWTH 5 DAYS Performed at  [facility], [address], [place], [place] [address]    Report Status  07/21/2022 FINAL  Final  Culture, blood (Routine x 2)     Status: None   Collection Time: 07/16/22  3:25 PM   Specimen: BLOOD  Result Value Ref Range Status   Specimen Description   Final    BLOOD LEFT ANTECUBITAL Performed at [facility], [address] [place], [place] [address]    Special Requests   Final    BOTTLES DRAWN AEROBIC AND ANAEROBIC Blood Culture adequate volume Performed at [facility], [address], [place], [place] [address]    Culture   Final    NO GROWTH 5 DAYS Performed at [facility], [address] [place], [place] [address]    Report Status 07/21/2022 FINAL  Final  Aerobic/Anaerobic Culture w Gram Stain (surgical/deep wound)     Status: None   Collection Time: 07/19/22  1:00 PM   Specimen: Neck, Left; Abscess  Result Value Ref Range Status   Specimen Description ABSCESS LEFT NECK  Final   Special Requests NONE  Final   Gram Stain   Final    FEW WBC PRESENT,BOTH PMN AND MONONUCLEAR RARE GRAM POSITIVE COCCI    Culture   Final    RARE STREPTOCOCCUS INTERMEDIUS NO ANAEROBES ISOLATED Performed at [facility], [address], [place], [place] [address]    Report Status 07/24/2022 FINAL  Final   Organism ID, Bacteria STREPTOCOCCUS INTERMEDIUS  Final      Susceptibility   Streptococcus intermedius - MIC*    PENICILLIN <=0.06 SENSITIVE Sensitive     CEFTRIAXONE <=0.12 SENSITIVE Sensitive     ERYTHROMYCIN >=8 RESISTANT Resistant     LEVOFLOXACIN <=0.25 SENSITIVE Sensitive     VANCOMYCIN 0.5 SENSITIVE Sensitive     * RARE STREPTOCOCCUS INTERMEDIUS     Radiology Studies: No results found.  Scheduled Meds:  amoxicillin-clavulanate  1 tablet Oral Q12H   atorvastatin  20 mg Oral QHS   buPROPion  150 mg Oral q morning   DULoxetine  60 mg Oral Daily   enoxaparin (LOVENOX) injection  40 mg Subcutaneous Daily   ferrous  sulfate  325 mg Oral Q breakfast   gabapentin  300 mg Oral TID   loratadine  10 mg Oral Daily   mesalamine  1.2 g Oral Daily   QUEtiapine  25 mg Oral TID   saccharomyces boulardii  250 mg Oral BID   topiramate  50 mg Oral Q12H   Continuous Infusions:  DAPTOmycin (CUBICIN) 700 mg in sodium chloride 0.9 % IVPB 700 mg (07/24/22 1555)     LOS: 8 days   Hughie Closs, MD [facility]  07/25/2022, 9:48 AM   *Please note that this is a verbal dictation therefore any spelling or grammatical errors are due to the "Dragon Medical One" system interpretation.  Please page via Amion and do not message via secure chat for urgent patient care matters. Secure chat can be used for non urgent patient care matters.  How to contact the [facility] Attending or Consulting provider 7A - 7P or covering provider during after hours 7P -7A, for this patient?  Check the care team in [facility] and look for a) attending/consulting TRH provider listed and b) the [facility] team listed. Page or secure chat 7A-7P. Log into [email or web address] and use [place]'s universal password to access. If you do not have the password, please contact the hospital operator. Locate the [facility] provider you are looking for under [facility] and page to a number that you can be  directly reached. If you still have difficulty reaching the provider, please page the [facility] (Director on Call) for the Hospitalists listed on amion for assistance.

## 2022-07-25 NOTE — Progress Notes (Signed)
ENT PROGRESS NOTE   Subjective: Patient seen and examined at bedside.  Patient reports tenderness over left face.   Objective: Vital signs in last 24 hours: Temp:  [98 F (36.7 C)-98.4 F (36.9 C)] 98.4 F (36.9 C) (05/14 0819) Pulse Rate:  [93-98] 98 (05/14 0819) Resp:  [15-20] 20 (05/14 0529) BP: (99-123)/(62-76) 99/62 (05/14 0819) SpO2:  [97 %-100 %] 99 % (05/14 0819)  CONSTITUTIONAL: well developed, nourished, no distress and alert and oriented x 3 PULMONARY/CHEST WALL:  no stridor, no stertor, no dysphonia HENT: Head : normocephalic and atraumatic Mouth/Throat:  Mouth: Edema of left soft palate and uvula has completely resolved, edema of the left tonsil is also improved without erythema or exudate. Floor of mouth soft. No intraoral purulence Throat: oropharynx clear and moist Mucous membranes: normal EYES: conjunctiva normal, EOM normal and PERRL NECK: Persistent mild induration of left neck with no palpable fluctuance, edema continues to gradually improve. Penrose drains in place in left neck, secured, with active drainage.   Recent Labs    07/24/22 0225 07/25/22 0915  NA 135 134*  K 3.9 3.2*  CL 107 101  CO2 23 24  GLUCOSE 82 129*  BUN 14 11  CREATININE 0.83 0.92  CALCIUM 7.9* 8.2*     Medications: I have reviewed the patient's current medications.  New Imaging: CT Neck with contrast reviewed.  Drains in place, in good position.  No new or persistent focal fluid collection noted on imaging.  Significant fat stranding and soft tissue edema consistent with cellulitis.  CLINICAL DATA:  Tooth abscess.  Facial swelling despite I and D.   EXAM: CT NECK WITH CONTRAST   TECHNIQUE: Multidetector CT imaging of the neck was performed using the standard protocol following the bolus administration of intravenous contrast.   RADIATION DOSE REDUCTION: This exam was performed according to the departmental dose-optimization program which includes automated exposure  control, adjustment of the mA and/or kV according to patient size and/or use of iterative reconstruction technique.   CONTRAST:  75mL OMNIPAQUE IOHEXOL 350 MG/ML SOLN   COMPARISON:  Neck MRI 5 days ago and neck CT 7 days ago   FINDINGS: Pharynx and larynx: Mass effect on the pharynx from left parapharyngeal and masticator space inflammation. No submucosal edema   Salivary glands: Expansion and edema involving the left submandibular gland, likely secondary.   Thyroid: Normal   Lymph nodes: None enlarged or abnormal density.   Vascular: Unremarkable   Limited intracranial: Negative   Visualized orbits: Partial coverage is negative   Mastoids and visualized paranasal sinuses: Clear   Skeleton: Left lower molar extraction with gas in the alveolus consistent with recent surgery. There is a redundant red rubber catheter in the left submandibular and masticator spaces without residual collection. Regional cellulitis appears similar to prior MRI, with some mass effect on the root of the tongue.   Upper chest: Negative   IMPRESSION: Odontogenic cellulitis centered on the left jaw with drain. No residual collection. No noted progression since recent face MRI.  Culture:  Specimen Description ABSCESS LEFT NECK  Special Requests NONE  Gram Stain FEW WBC PRESENT,BOTH PMN AND MONONUCLEAR RARE GRAM POSITIVE COCCI  Culture CULTURE REINCUBATED FOR BETTER GROWTH Performed at [facility], [address] [place], [place] [address]  Report Status PENDING    Assessment/Plan: Jeff Chessor is a 52 y/o W with neck abscess secondary to odontogenic process. Patient's symptoms began following root canal and tooth extraction. She is now POD #6  s/p Incision and drainage by Dr. Jearld Fenton. ID following, patient currently on Flagyl, Vancomycin and rocephin.  Patient continues to have appreciable improvement on physical exam. Penrose drains removed, incision site care reviewed with  patient. Counseled her to expect continued drainage while site heals. Encourage tissue approximation with surgical tape or bandaid.   -Further management as per primary team -Discharge abx as per ID -Continue warm compresses to left neck -Consider additional dose of decadron/discharge home with prednisone with help with residual edema. -Follow up with DMD, PCP following DC. FU with ENT as needed.    LOS: 8 days    Laren Boom, DO [facility] ENT 07/25/2022, 12:53 PM

## 2022-07-25 NOTE — Progress Notes (Signed)
[facility]  Date of Admission:  07/16/2022   Total days of inpatient antibiotics 7  Principal Problem:   Oral infection Active Problems:   Bacterial oral infection   Hypokalemia   Sepsis [facility])          Assessment: 52 year old female who root canal and tooth extraction several weeks ago complicated with pain and swelling admitted with:     #Tooth 18 extraction complicated by mandibular abscess status post I&D on 5/8 with Cx+ Strep intemedius #Extraction complicated by cellulitis as well - Initially patient had a infected tooth for which she was placed on clindamycin.  Few days later she underwent a root canal she noticed within a few days she had increased swelling and pain.  Tooth was extracted a few days later clindamycin continued.  After tooth extraction she noted significant pain, edema around left mandible.  Also noticed, bump under her tongue tongue and swelling around her ear.  - Arrival to ED WBC 19.4 K.  She was started on IV metronidazole and clindamycin.  ENT engaged as MRI showed large areas of inflammation at the left floor of mouth extending into Pharyngeal, submandibular mass to cater spaces.  3.2X 1.3 cm abscess deep to the left mandibular angle.  And arising from site of recent tooth 18 extraction.  Left facial cellulitis and myositis. - Underwent neck dissection with ENT on 5/8, or cultures on 5/8 showed rare GPC on Gram stain. significant pus noted per or note on dissection. Plan: - Continue dapto and Augmentin, anticipate 6 weeks pending ENT eval today -Follow surgical plan with ENT  #Childhood penicillin allergy  -tolerated amoxicillin challenge, on augmentin as above  #Ulcerative colitis on mesalamine and Remicade - Management per primary, -   Patient's last Remicade dose was about 9 weeks ago.  She gets Remicade every 8 weeks.  She was counseled to hold Remicade for 7 weeks if she is ever on antibiotics.  Pt plans to letting  her GI provider know about her recent infection.  Microbiology:   Antibiotics: Clindamycin 5/5 - 5/6 Metronidazole 5/5, 5/7-present Ceftriaxone 5/7, 5/8 Cultures: Blood 5/5 no growth     Other 5/3 cultures strep inermedius   SUBJECTIVE: Resting bed. Reprots area is still tnder Interval: Afebrile overnight. Wbc 19k  Review of Systems: Review of Systems  All other systems reviewed and are negative.    Scheduled Meds:  amoxicillin-clavulanate  1 tablet Oral Q12H   atorvastatin  20 mg Oral QHS   buPROPion  150 mg Oral q morning   DULoxetine  60 mg Oral Daily   enoxaparin (LOVENOX) injection  40 mg Subcutaneous Daily   ferrous sulfate  325 mg Oral Q breakfast   gabapentin  300 mg Oral TID   loratadine  10 mg Oral Daily   mesalamine  1.2 g Oral Daily   QUEtiapine  25 mg Oral TID   saccharomyces boulardii  250 mg Oral BID   topiramate  50 mg Oral Q12H   Continuous Infusions:  DAPTOmycin (CUBICIN) 700 mg in sodium chloride 0.9 % IVPB 700 mg (07/24/22 1555)   PRN Meds:.acetaminophen, benzocaine, cyclobenzaprine, diphenhydrAMINE, EPINEPHrine, HYDROmorphone (DILAUDID) injection, hydrOXYzine, menthol-cetylpyridinium, ondansetron (ZOFRAN) IV, oxyCODONE, phenol, polyethylene glycol, prochlorperazine, SUMAtriptan Allergies  Allergen Reactions   Aspirin Other (See Comments)    Ulcerative colitis, abdominal pain   Ceftin [Cefuroxime] Other (See Comments)    Diarrhea, abdominal cramping   Codeine Other (See Comments)  Severe headaches   Lactose Other (See Comments)    Dairy - sets off my colitis   Other Rash and Other (See Comments)    Contact metal agents     OBJECTIVE: Vitals:   07/24/22 1553 07/24/22 2043 07/25/22 0529 07/25/22 0819  BP: 123/71 110/70 110/76 99/62  Pulse: 95 95 93 98  Resp: 15 20 20    Temp:  98.3 F (36.8 C) 98 F (36.7 C) 98.4 F (36.9 C)  TempSrc:   Oral Oral  SpO2: 97% 97% 100% 99%  Weight:      Height:       Body mass index is 42.91  kg/m.  Physical Exam Constitutional:      Appearance: Normal appearance.  HENT:     Head: Normocephalic and atraumatic.     Right Ear: Tympanic membrane normal.     Left Ear: Tympanic membrane normal.     Nose: Nose normal.     Mouth/Throat:     Mouth: Mucous membranes are moist.  Eyes:     Extraocular Movements: Extraocular movements intact.     Conjunctiva/sclera: Conjunctivae normal.     Pupils: Pupils are equal, round, and reactive to light.  Cardiovascular:     Rate and Rhythm: Normal rate and regular rhythm.     Heart sounds: No murmur heard.    No friction rub. No gallop.  Pulmonary:     Effort: Pulmonary effort is normal.     Breath sounds: Normal breath sounds.  Abdominal:     General: Abdomen is flat.     Palpations: Abdomen is soft.  Musculoskeletal:        General: Normal range of motion.  Skin:    General: Skin is warm and dry.  Neurological:     General: No focal deficit present.     Mental Status: Rebekah Silva is alert and oriented to person, place, and time.  Psychiatric:        Mood and Affect: Mood normal.     Neck wound with drain, swelling contienued  Lab Results Lab Results  Component Value Date   WBC 19.1 (H) 07/25/2022   HGB 9.8 (L) 07/25/2022   HCT 32.6 (L) 07/25/2022   MCV 75.3 (L) 07/25/2022   PLT 639 (H) 07/25/2022    Lab Results  Component Value Date   CREATININE 0.92 07/25/2022   BUN 11 07/25/2022   NA 134 (L) 07/25/2022   K 3.2 (L) 07/25/2022   CL 101 07/25/2022   CO2 24 07/25/2022    Lab Results  Component Value Date   ALT 18 07/18/2022   AST 16 07/18/2022   ALKPHOS 92 07/18/2022   BILITOT 0.4 07/18/2022        Danelle Earthly, MD [facility] 07/25/2022, 11:56 AM  I have personally spent 51 minutes involved in face-to-face and non-face-to-face activities for this patient on the day of the visit. Professional time spent includes the following activities:  Preparing to see the patient (review of tests), Obtaining and/or reviewing separately obtained history (admission/discharge record), Performing a medically appropriate examination and/or evaluation , Ordering medications/tests/procedures, referring and communicating with other health care professionals, Documenting clinical information in the EMR, Independently interpreting results (not separately reported), Communicating results to the patient/family/caregiver, Counseling and educating the patient/family/caregiver and Care coordination (not separately reported).

## 2022-07-26 ENCOUNTER — Other Ambulatory Visit: Payer: Self-pay

## 2022-07-26 DIAGNOSIS — K5190 Ulcerative colitis, unspecified, without complications: Secondary | ICD-10-CM | POA: Diagnosis not present

## 2022-07-26 DIAGNOSIS — K122 Cellulitis and abscess of mouth: Secondary | ICD-10-CM | POA: Diagnosis not present

## 2022-07-26 NOTE — Progress Notes (Signed)
TPN stopped .  IV team removed PICC line at bedside.

## 2022-07-26 NOTE — Progress Notes (Signed)
[facility]  Date of Admission:  07/16/2022   Total days of inpatient antibiotics 7  Principal Problem:   Oral infection Active Problems:   Bacterial oral infection   Hypokalemia   Sepsis [facility])          Assessment: 52 year old female who root canal and tooth extraction several weeks ago complicated with pain and swelling admitted with:     #Tooth 18 extraction complicated by mandibular abscess status post I&D on 5/8 with Cx+ Strep intemedius #Extraction complicated by cellulitis as well - Initially patient had a infected tooth for which she was placed on clindamycin.  Few days later she underwent a root canal she noticed within a few days she had increased swelling and pain.  Tooth was extracted a few days later clindamycin continued.  After tooth extraction she noted significant pain, edema around left mandible.  Also noticed, bump under her tongue tongue and swelling around her ear.  - Arrival to ED WBC 19.4 K.  She was started on IV metronidazole and clindamycin.  ENT engaged as MRI showed large areas of inflammation at the left floor of mouth extending into Pharyngeal, submandibular mass to cater spaces.  3.2X 1.3 cm abscess deep to the left mandibular angle.  And arising from site of recent tooth 18 extraction.  Left facial cellulitis and myositis. - Underwent neck dissection with ENT on 5/8, or cultures on 5/8 showed rare GPC on Gram stain. significant pus noted per or note on dissection. - Seen by ENT on 5/14 drain is out. Plan: - Continue dapto and Augmentin, anticipate 6 weeks.  Place PICC - Follow-up with infectious disease - ID will follow peripherally   #Ulcerative colitis on mesalamine and Remicade - Management per primary, -   Patient's last Remicade dose was about 9 weeks ago.  She gets Remicade every 8 weeks.  She was counseled to hold Remicade for 7 weeks if she is ever on antibiotics.  Pt communicated dated up with her  rheumatologist, plan to hold Remicade while on antibiotics.  Reassess time to restart Remicade once antibiotic is done.  #Loose stools - Patient states she has had about 4 loose stools in the last 24 hours that are less formed.  He also reports abdominal cramping.  It is unclear if this is related to antibiotics versus ulcerative colitis given that she did not get her last dose of Remicade.  With that said I will put in for C. difficile testing.  Of note she is on steroids for neck wound edema.  States she is on probiotics.   #Childhood penicillin allergy  -tolerated amoxicillin challenge, on augmentin as above  OPAT ORDERS:  Diagnosis: mandibular abscess  Culture Result: Strep intermedius  Allergies  Allergen Reactions   Aspirin Other (See Comments)    Ulcerative colitis, abdominal pain   Ceftin [Cefuroxime] Other (See Comments)    Diarrhea, abdominal cramping   Codeine Other (See Comments)    Severe headaches   Lactose Other (See Comments)    Dairy - sets off my colitis   Other Rash and Other (See Comments)    Contact metal agents      Discharge antibiotics to be given via PICC line:  Per pharmacy protocol dapto 700mg  IV q 24h and  Augmentin 875/125 mg PO bid   Duration: 6 weeks End Date: 09/01/22  [facility] Care Per Protocol with [employer] Use: Home health RN for IV administration and teaching, line  care and labs.    Labs weekly while on IV antibiotics: x__ CBC with differential __ BMP **TWICE WEEKLY ON VANCOMYCIN  _x_ CMP _xx_ CRP __x ESR __ Vancomycin trough TWICE WEEKLY _x_ CK  __ Please pull PIC at completion of IV antibiotics __x Please leave PIC in place until doctor has seen patient or been notified  Fax weekly labs to [phone]  Clinic Follow Up Appt: 08/15/22  @ [facility] with Thedore Mins  Microbiology:   Antibiotics: Clindamycin 5/5 - 5/6 Metronidazole 5/5, 5/7-present Ceftriaxone 5/7, 5/8 Cultures: Blood 5/5 no growth     Other 5/3 cultures strep  inermedius   SUBJECTIVE: Resting bed. Are is tender, she states she feels, blah. Some loose stools without abdominal pain.  Interval: Afebrile overnight. Wbc 19k  Review of Systems: Review of Systems  All other systems reviewed and are negative.    Scheduled Meds:  amoxicillin-clavulanate  1 tablet Oral Q12H   atorvastatin  20 mg Oral QHS   buPROPion  150 mg Oral q morning   DULoxetine  60 mg Oral Daily   enoxaparin (LOVENOX) injection  40 mg Subcutaneous Daily   ferrous sulfate  325 mg Oral Q breakfast   gabapentin  300 mg Oral TID   loratadine  10 mg Oral Daily   mesalamine  1.2 g Oral Daily   QUEtiapine  25 mg Oral TID   saccharomyces boulardii  250 mg Oral BID   topiramate  50 mg Oral Q12H   Continuous Infusions:  DAPTOmycin (CUBICIN) 700 mg in sodium chloride 0.9 % IVPB 700 mg (07/25/22 2154)   PRN Meds:.acetaminophen, benzocaine, cyclobenzaprine, diphenhydrAMINE, EPINEPHrine, HYDROmorphone (DILAUDID) injection, hydrOXYzine, menthol-cetylpyridinium, ondansetron (ZOFRAN) IV, oxyCODONE, phenol, polyethylene glycol, prochlorperazine, SUMAtriptan Allergies  Allergen Reactions   Aspirin Other (See Comments)    Ulcerative colitis, abdominal pain   Ceftin [Cefuroxime] Other (See Comments)    Diarrhea, abdominal cramping   Codeine Other (See Comments)    Severe headaches   Lactose Other (See Comments)    Dairy - sets off my colitis   Other Rash and Other (See Comments)    Contact metal agents     OBJECTIVE: Vitals:   07/25/22 1705 07/25/22 2038 07/26/22 0551 07/26/22 0900  BP: 111/83 117/77 106/76 126/75  Pulse:  97 96 (!) 102  Resp: 18 20 20 18   Temp: 98.1 F (36.7 C) 98.4 F (36.9 C) 98.4 F (36.9 C)   TempSrc: Oral Oral Oral   SpO2: 99% 99% 98% 100%  Weight:      Height:       Body mass index is 42.91 kg/m.  Physical Exam Constitutional:      Appearance: Normal appearance.  HENT:     Head: Normocephalic and atraumatic.     Right Ear: Tympanic  membrane normal.     Left Ear: Tympanic membrane normal.     Nose: Nose normal.     Mouth/Throat:     Mouth: Mucous membranes are moist.  Eyes:     Extraocular Movements: Extraocular movements intact.     Conjunctiva/sclera: Conjunctivae normal.     Pupils: Pupils are equal, round, and reactive to light.  Cardiovascular:     Rate and Rhythm: Normal rate and regular rhythm.     Heart sounds: No murmur heard.    No friction rub. No gallop.  Pulmonary:     Effort: Pulmonary effort is normal.     Breath sounds: Normal breath sounds.  Abdominal:     General: Abdomen is  flat.     Palpations: Abdomen is soft.  Musculoskeletal:        General: Normal range of motion.  Skin:    General: Skin is warm and dry.  Neurological:     General: No focal deficit present.     Mental Status: Rebekah Silva is alert and oriented to person, place, and time.  Psychiatric:        Mood and Affect: Mood normal.     Neck wound with drain, swelling contienued  Lab Results Lab Results  Component Value Date   WBC 19.1 (H) 07/25/2022   HGB 9.8 (L) 07/25/2022   HCT 32.6 (L) 07/25/2022   MCV 75.3 (L) 07/25/2022   PLT 639 (H) 07/25/2022    Lab Results  Component Value Date   CREATININE 0.92 07/25/2022   BUN 11 07/25/2022   NA 134 (L) 07/25/2022   K 3.2 (L) 07/25/2022   CL 101 07/25/2022   CO2 24 07/25/2022    Lab Results  Component Value Date   ALT 18 07/18/2022   AST 16 07/18/2022   ALKPHOS 92 07/18/2022   BILITOT 0.4 07/18/2022        Danelle Earthly, MD [facility] 07/26/2022, 12:05 PM  I have personally spent55 minutes involved in face-to-face and non-face-to-face activities for this patient on the day of the visit. Professional time spent includes the following activities: Preparing to see the patient (review of tests), Obtaining and/or reviewing separately obtained history (admission/discharge record), Performing a medically  appropriate examination and/or evaluation , Ordering medications/tests/procedures, referring and communicating with other health care professionals, Documenting clinical information in the EMR, Independently interpreting results (not separately reported), Communicating results to the patient/family/caregiver, Counseling and educating the patient/family/caregiver and Care coordination (not separately reported).

## 2022-07-26 NOTE — Progress Notes (Signed)
PROGRESS NOTE  Rebekah Silva  DOB: 12-10-1970  PCP: Laurann Montana, MD OEU:[id number]  DOA: 07/16/2022  LOS: 9 days  Hospital Day: 11  Brief narrative: Rebekah Silva is a 52 y.o. female with PMH significant for ulcerative colitis (on Remicade and Lialda), SLE, GERD, anxiety/depression 5/5, patient presented to the ED with complaint of left jaw pain.   Patient had a root canal done originally but continued to have pain and swelling and therefore underwent tooth extraction on 5/3 as an outpatient.  Postprocedure, see had severe pain and swelling of her left jaw, unable to open her mouth and was hence sent to the ED.    Initial labs with WC count elevated to 19.4 CT neck showed inflammation was negative for abscess.   Admitted to [facility] for sepsis secondary to oral infection Due to worsening left-sided pain, MRI neck was obtained 5/7 which showed large area of inflammation at the left floor of mouth, extending into the parapharyngeal, submandibular and masticator spaces.  5/8 underwent I&D for mandibular abscess by Dr. Jearld Fenton  Subjective: Patient was seen and examined this morning.  Pleasant middle-aged Caucasian female.  Lying down in bed.  Complains of pain a little worse today after drain was removed yesterday by ENT. Chart reviewed In the last 24 hours, no fever, hemodynamically stable Last set of labs from yesterday morning.  Assessment and plan: Sepsis POA Odontogenic and mandibular abscess following tooth extraction S/p I&D -5/8 Dr. Jearld Fenton ID and ENT following. Initially treated with broad-spectrum IV antibiotics. Repeat CT 5/12 did not show residual abscess. Currently on oral Augmentin and IV daptomycin per ID recommendation.  Order in for PICC line placement today Mandibular drain removed by ENT on 5/14.  To continue warm compression to left neck.  ENT recommended prednisone at discharge to help with residual edema.  To follow-up with ENT as an outpatient She still  complains of pain at the left which is gradually improving and is more able to open her mouth.    Hyponatremia Mild.  Continue monitor Recent Labs  Lab 07/20/22 0827 07/21/22 0206 07/22/22 0145 07/23/22 0530 07/24/22 0225 07/25/22 0915  NA 136 132* 136 136 135 134*   Hypokalemia Potassium on last check 5/14 was 3.2.  Replacement given.  Repeat labs tomorrow Recent Labs  Lab 07/21/22 0206 07/22/22 0145 07/23/22 0530 07/24/22 0225 07/25/22 0915  K 3.2* 4.1 3.2* 3.9 3.2*   History of ulcerative colitis On mesalamine and Remicade.   Chronic microcytic iron deficiency anemia No obvious source of bleeding other than menstrual loss.  No need of transfusion at this time.  Continue iron tablets. Recent Labs    07/18/22 0320 07/19/22 0620 07/21/22 0206 07/22/22 0145 07/23/22 0530 07/24/22 0225 07/25/22 0915  HGB 9.8*   < > 10.0* 9.0* 9.9* 8.9* 9.8*  MCV 73.0*   < > 71.9* 72.8* 73.5* 74.2* 75.3*  VITAMINB12 1,898*  --   --   --   --   --   --   FERRITIN 16  --   --   --   --   --   --   TIBC 298  --   --   --   --   --   --   IRON 10*  --   --   --   --   --   --   RETICCTPCT 2.5  --   --   --   --   --   --    < > =  values in this interval not displayed.   Anxiety/depression On multiple mood altering medications including Wellbutrin, Cymbalta, Seroquel, Topamax, Atarax, gabapentin.   HLD Continue statin.  Morbid Obesity  Body mass index is 42.91 kg/m. Patient has been advised to make an attempt to improve diet and exercise patterns to aid in weight loss.   Mobility: Encourage ambulation  Goals of care   Code Status: Full Code     DVT prophylaxis:  enoxaparin (LOVENOX) injection 40 mg Start: 07/17/22 1000   Antimicrobials: Currently on oral Augmentin and IV daptomycin Fluid: None Consultants: ENT, ID Family Communication: None at bedside  Status: Inpatient Level of care:  Med-Surg   Patient from: Home Anticipated d/c to: Hopefully home  tomorrow Needs to continue in-hospital care:  Pending PICC line placement today.  Also complains of worsening pain.  Monitor for next 24 hours    Diet:  Diet Order             DIET DYS 3 Room service appropriate? Yes; Fluid consistency: Thin  Diet effective now                   Scheduled Meds:  amoxicillin-clavulanate  1 tablet Oral Q12H   atorvastatin  20 mg Oral QHS   buPROPion  150 mg Oral q morning   DULoxetine  60 mg Oral Daily   enoxaparin (LOVENOX) injection  40 mg Subcutaneous Daily   ferrous sulfate  325 mg Oral Q breakfast   gabapentin  300 mg Oral TID   loratadine  10 mg Oral Daily   mesalamine  1.2 g Oral Daily   QUEtiapine  25 mg Oral TID   saccharomyces boulardii  250 mg Oral BID   topiramate  50 mg Oral Q12H    PRN meds: acetaminophen, benzocaine, cyclobenzaprine, diphenhydrAMINE, EPINEPHrine, HYDROmorphone (DILAUDID) injection, hydrOXYzine, menthol-cetylpyridinium, ondansetron (ZOFRAN) IV, oxyCODONE, phenol, polyethylene glycol, prochlorperazine, SUMAtriptan   Infusions:   DAPTOmycin (CUBICIN) 700 mg in sodium chloride 0.9 % IVPB 700 mg (07/25/22 2154)    Antimicrobials: Anti-infectives (From admission, onward)    Start     Dose/Rate Route Frequency Ordered Stop   07/24/22 1400  DAPTOmycin (CUBICIN) 700 mg in sodium chloride 0.9 % IVPB        8 mg/kg  86.7 kg (Adjusted) 128 mL/hr over 30 Minutes Intravenous Daily 07/24/22 1210     07/24/22 1045  amoxicillin-clavulanate (AUGMENTIN) 875-125 MG per tablet 1 tablet        1 tablet Oral Every 12 hours 07/24/22 0955     07/21/22 2300  vancomycin (VANCOREADY) IVPB 750 mg/150 mL  Status:  Discontinued        750 mg 150 mL/hr over 60 Minutes Intravenous Every 8 hours 07/21/22 1329 07/24/22 1210   07/21/22 1500  vancomycin (VANCOCIN) 2,250 mg in sodium chloride 0.9 % 500 mL IVPB        2,250 mg 261.3 mL/hr over 120 Minutes Intravenous  Once 07/21/22 1329 07/21/22 1800   07/21/22 1445  amoxicillin  (AMOXIL) capsule 500 mg        500 mg Oral  Once 07/21/22 1349 07/21/22 1529   07/18/22 1200  cefTRIAXone (ROCEPHIN) 2 g in sodium chloride 0.9 % 100 mL IVPB  Status:  Discontinued        2 g 200 mL/hr over 30 Minutes Intravenous Every 24 hours 07/18/22 0941 07/24/22 0955   07/18/22 1000  metroNIDAZOLE (FLAGYL) IVPB 500 mg  Status:  Discontinued  500 mg 100 mL/hr over 60 Minutes Intravenous Every 12 hours 07/18/22 0941 07/24/22 0955   07/17/22 2200  clindamycin (CLEOCIN) IVPB 300 mg  Status:  Discontinued        300 mg 100 mL/hr over 30 Minutes Intravenous Every 6 hours 07/17/22 1557 07/18/22 0941   07/17/22 0000  metroNIDAZOLE (FLAGYL) IVPB 500 mg  Status:  Discontinued        500 mg 100 mL/hr over 60 Minutes Intravenous Every 12 hours 07/16/22 2308 07/16/22 2309   07/16/22 1830  clindamycin (CLEOCIN) IVPB 600 mg  Status:  Discontinued        600 mg 100 mL/hr over 30 Minutes Intravenous Every 6 hours 07/16/22 1829 07/17/22 1521   07/16/22 1730  metroNIDAZOLE (FLAGYL) IVPB 500 mg        500 mg 100 mL/hr over 60 Minutes Intravenous  Once 07/16/22 1722 07/16/22 1926       Nutritional status:  Body mass index is 42.91 kg/m.          Objective: Vitals:   07/26/22 0551 07/26/22 0900  BP: 106/76 126/75  Pulse: 96 (!) 102  Resp: 20 18  Temp: 98.4 F (36.9 C)   SpO2: 98% 100%    Intake/Output Summary (Last 24 hours) at 07/26/2022 1437 Last data filed at 07/25/2022 2154 Gross per 24 hour  Intake 150 ml  Output --  Net 150 ml   Filed Weights   07/16/22 1528  Weight: 124.3 kg   Weight change:  Body mass index is 42.91 kg/m.   Physical Exam: General exam: Pleasant, middle-aged Caucasian female. Skin: No rashes, lesions or ulcers. HEENT: Atraumatic, normocephalic, no obvious bleeding.  Bandage present over I&D site on the left side of neck Lungs: Clear to auscultation bilaterally CVS: Regular rate and rhythm, no murmur GI/Abd soft, nontender, nondistended,  bowel sound present CNS: Alert, awake, oriented x 3 Psychiatry: Mood appropriate Extremities: No pedal edema, no calf tenderness  Data Review: I have personally reviewed the laboratory data and studies available.  F/u labs ordered Unresulted Labs (From admission, onward)     Start     Ordered   07/27/22 0500  Basic metabolic panel  Tomorrow morning,   R        07/26/22 0813   07/27/22 0500  CBC with Differential/Platelet  Tomorrow morning,   R        07/26/22 0906   07/26/22 1237  C Difficile Quick Screen (NO PCR Reflex)  (C Difficile Quick Screen (NO PCR Reflex) panel)  Once, for 24 hours,   TIMED       References:    CDiff Information Tool  Question Answer Comment  Is your patient experiencing watery stools (3 or more in 24 hours)? Yes   Does the patient have signs/symptoms: Temperature >100.18F; abdominal pain, cramping, tenderness/distention or  leukocytosis. Yes      07/26/22 1236   07/25/22 0500  CK  Weekly,   R      07/24/22 1210   07/23/22 0500  Creatinine, serum  (enoxaparin (LOVENOX)    CrCl >/= 30 ml/min)  Weekly,   R     Comments: while on enoxaparin therapy    07/16/22 2254            Total time spent in review of labs and imaging, patient evaluation, formulation of plan, documentation and communication with family: 45 minutes  Signed, Lorin Glass, MD [facility] 07/26/2022

## 2022-07-27 DIAGNOSIS — K122 Cellulitis and abscess of mouth: Secondary | ICD-10-CM | POA: Diagnosis not present

## 2022-07-27 LAB — GASTROINTESTINAL PANEL BY PCR, STOOL (REPLACES STOOL CULTURE)

## 2022-07-27 LAB — CBC WITH DIFFERENTIAL/PLATELET
Abs Immature Granulocytes: 0 K/uL (ref 0.00–0.07)
Basophils Absolute: 0.2 K/uL — ABNORMAL HIGH (ref 0.0–0.1)
Basophils Relative: 1 %
Eosinophils Absolute: 0.6 K/uL — ABNORMAL HIGH (ref 0.0–0.5)
Eosinophils Relative: 4 %
HCT: 33.5 % — ABNORMAL LOW (ref 36.0–46.0)
Hemoglobin: 10.2 g/dL — ABNORMAL LOW (ref 12.0–15.0)
Lymphocytes Relative: 11 %
Lymphs Abs: 1.7 K/uL (ref 0.7–4.0)
MCH: 22.7 pg — ABNORMAL LOW (ref 26.0–34.0)
MCHC: 30.4 g/dL (ref 30.0–36.0)
MCV: 74.4 fL — ABNORMAL LOW (ref 80.0–100.0)
Monocytes Absolute: 0.5 K/uL (ref 0.1–1.0)
Monocytes Relative: 3 %
Neutro Abs: 12.6 K/uL — ABNORMAL HIGH (ref 1.7–7.7)
Neutrophils Relative %: 81 %
Platelets: 567 K/uL — ABNORMAL HIGH (ref 150–400)
RBC: 4.5 MIL/uL (ref 3.87–5.11)
RDW: 23 % — ABNORMAL HIGH (ref 11.5–15.5)
WBC: 15.5 K/uL — ABNORMAL HIGH (ref 4.0–10.5)
nRBC: 0 % (ref 0.0–0.2)
nRBC: 1 /100{WBCs} — ABNORMAL HIGH

## 2022-07-27 LAB — BASIC METABOLIC PANEL
Anion gap: 10 (ref 5–15)
BUN: 7 mg/dL (ref 6–20)
CO2: 24 mmol/L (ref 22–32)
Calcium: 8.2 mg/dL — ABNORMAL LOW (ref 8.9–10.3)
Chloride: 100 mmol/L (ref 98–111)
Creatinine, Ser: 0.84 mg/dL (ref 0.44–1.00)
GFR, Estimated: >60 mL/min (ref >60)
Glucose, Bld: 103 mg/dL — ABNORMAL HIGH (ref 70–99)
Potassium: 3.6 mmol/L (ref 3.5–5.1)
Sodium: 134 mmol/L — ABNORMAL LOW (ref 135–145)

## 2022-07-27 MED ORDER — AMOXICILLIN-POT CLAVULANATE 875-125 MG PO TABS: 1.0000 | ORAL_TABLET | Freq: Two times a day (BID) | ORAL | 0 refills | Status: AC

## 2022-07-27 MED ORDER — SODIUM CHLORIDE 0.9% FLUSH: 10.0000 mL | INTRAVENOUS | Status: DC | PRN

## 2022-07-27 MED ORDER — PREDNISONE 10 MG PO TABS: 10.0000 mg | ORAL_TABLET | Freq: Every day | ORAL | 0 refills | Status: AC

## 2022-07-27 MED ORDER — CHLORHEXIDINE GLUCONATE CLOTH 2 % EX PADS
6.0000 | MEDICATED_PAD | Freq: Every day | CUTANEOUS | Status: DC
  Administered 2022-07-27: 6 via TOPICAL

## 2022-07-27 MED ORDER — FERROUS SULFATE 325 (65 FE) MG PO TABS: 325.0000 mg | ORAL_TABLET | Freq: Every day | ORAL | 0 refills | Status: DC

## 2022-07-27 MED ORDER — DAPTOMYCIN IV (FOR PTA / DISCHARGE USE ONLY)
700.0000 mg | INTRAVENOUS | 0 refills | Status: AC
Start: 2022-07-27 — End: 2022-09-03

## 2022-07-27 MED ORDER — HEPARIN SOD (PORK) LOCK FLUSH 100 UNIT/ML IV SOLN: 250.0000 [IU] | INTRAVENOUS | Status: DC | PRN

## 2022-07-27 MED ORDER — OXYCODONE HCL 5 MG PO TABS: 5.0000 mg | ORAL_TABLET | ORAL | 0 refills | Status: AC | PRN

## 2022-07-27 MED ORDER — SACCHAROMYCES BOULARDII 250 MG PO CAPS: 250.0000 mg | ORAL_CAPSULE | Freq: Two times a day (BID) | ORAL | 0 refills | Status: AC

## 2022-07-27 NOTE — Progress Notes (Signed)
Patient has been discharged home with her husband. Single lumen  PICC line is in place.Site looks clean, dry and  intact. Education provided regarding her home medication and patient verbalize understanding. All her belongings and AVS paper is handed to patient.

## 2022-07-27 NOTE — TOC Transition Note (Signed)
Transition of Care [facility]) - CM/SW Discharge Note   Patient Details  Name: Rebekah Silva MRN: [id number] Date of Birth: 12-13-1970  Transition of Care [facility]) CM/SW Contact:  Harriet Masson, RN Phone Number: 07/27/2022, 12:09 PM   Clinical Narrative:    Patient stable for discharge.  Norm Salt and Delila Spence, notified of discharge.  PICC line orders.  Pam has taught patient how to administer IV abx.  No other TOC needs.   Final next level of care: Home w Home Health Services Barriers to Discharge: Barriers Resolved   Patient Goals and CMS Choice CMS Medicare.gov Compare Post Acute Care list provided to:: Patient Choice offered to / list presented to : Patient  Discharge Placement                         Discharge Plan and Services Additional resources added to the After Visit Summary for       Post Acute Care Choice: Home Health                    HH Arranged: RN [facility] Agency: [facility] Date [facility] Agency Contacted: 07/24/22 Time HH Agency Contacted: 1430 Representative spoke with at [facility] Agency: Kandee Keen  Social Determinants of Health (SDOH) Interventions SDOH Screenings   Food Insecurity: No Food Insecurity (07/22/2022)  Housing: Low Risk  (07/22/2022)  Transportation Needs: No Transportation Needs (07/22/2022)  Utilities: Not At Risk (07/22/2022)  Alcohol Screen: Low Risk  (05/26/2021)  Depression (PHQ2-9): High Risk (07/05/2021)  Tobacco Use: Low Risk  (07/20/2022)     Readmission Risk Interventions     No data to display

## 2022-07-27 NOTE — Discharge Summary (Signed)
Physician Discharge Summary  Rebekah Silva ZOX:[id number] DOB: 1970/06/13 DOA: 07/16/2022  PCP: Laurann Montana, MD  Admit date: 07/16/2022 Discharge date: 07/27/2022  Admitted From: Home Discharge disposition: Home with IV antibiotics  Recommendations at discharge:  6 weeks of antibiotics with probiotics 5 days of prednisone Follow-up with ID and ENT as an outpatient.  Brief narrative: Rebekah Silva is a 52 y.o. female with PMH significant for ulcerative colitis (on Remicade and Lialda), SLE, GERD, anxiety/depression 5/5, patient presented to the ED with complaint of left jaw pain.   Patient had a root canal done originally but continued to have pain and swelling and therefore underwent tooth extraction on 5/3 as an outpatient.  Postprocedure, see had severe pain and swelling of her left jaw, unable to open her mouth and was hence sent to the ED.    Initial labs with WC count elevated to 19.4 CT neck showed inflammation was negative for abscess.   Admitted to [facility] for sepsis secondary to oral infection Due to worsening left-sided pain, MRI neck was obtained 5/7 which showed large area of inflammation at the left floor of mouth, extending into the parapharyngeal, submandibular and masticator spaces.  5/8 underwent I&D for mandibular abscess by Dr. Jearld Fenton  Subjective: Patient was seen and examined this morning.   Sitting up in bed.  Not in distress manage symptoms.  Continues to have mild discharge from the surgical wound on left side of neck.  Assessment and plan: Sepsis POA Odontogenic and mandibular abscess following tooth extraction S/p I&D -5/8 Dr. Jearld Fenton ID and ENT following. Initially treated with broad-spectrum IV antibiotics. Repeat CT 5/12 did not show residual abscess. Currently on oral Augmentin and IV daptomycin per ID recommendation.  PICC line placed.  Total of 6 weeks of antibiotics per ID. Mandibular drain removed by ENT on 5/14.  To continue warm  compression to left neck.  ENT recommended prednisone at discharge to help with residual edema.  I wrote for prescription for 5 days. To follow-up with ENT as an outpatient Pain improving.  Hyponatremia Mild.  Expected to improve with improvement in appetite. Recent Labs  Lab 07/21/22 0206 07/22/22 0145 07/23/22 0530 07/24/22 0225 07/25/22 0915 07/27/22 0604  NA 132* 136 136 135 134* 134*   History of ulcerative colitis On mesalamine and Remicade. Chronic mild diarrhea continues.  Imodium as needed.   Chronic microcytic iron deficiency anemia No obvious source of bleeding other than menstrual loss.  No need of transfusion at this time.  Continue iron tablets. Recent Labs    07/18/22 0320 07/19/22 0620 07/22/22 0145 07/23/22 0530 07/24/22 0225 07/25/22 0915 07/27/22 0604  HGB 9.8*   < > 9.0* 9.9* 8.9* 9.8* 10.2*  MCV 73.0*   < > 72.8* 73.5* 74.2* 75.3* 74.4*  VITAMINB12 1,898*  --   --   --   --   --   --   FERRITIN 16  --   --   --   --   --   --   TIBC 298  --   --   --   --   --   --   IRON 10*  --   --   --   --   --   --   RETICCTPCT 2.5  --   --   --   --   --   --    < > = values in this interval not displayed.   Anxiety/depression On multiple mood altering  medications including Wellbutrin, Cymbalta, Seroquel, Topamax, Atarax, gabapentin.   HLD Continue statin.  Morbid Obesity  Body mass index is 42.91 kg/m. Patient has been advised to make an attempt to improve diet and exercise patterns to aid in weight loss.  Goals of care   Code Status: Full Code   Wounds:  - Incision (Closed) 07/19/22 Neck Left (Active)  Date First Assessed/Time First Assessed: 07/19/22 1307   Location: Neck  Location Orientation: Left    Assessments 07/18/2022  7:30 PM 07/27/2022  7:41 AM  Dressing Type Gauze (Comment) Gauze (Comment)  Dressing Changed Clean, Dry, Intact  Dressing Change Frequency PRN --  Site / Wound Assessment Dressing in place / Unable to assess Dressing in  place / Unable to assess     No associated orders.    Discharge Exam:   Vitals:   07/26/22 0900 07/26/22 2055 07/27/22 0630 07/27/22 0759  BP: 126/75 105/75 115/72 108/69  Pulse: (!) 102 (!) 101 (!) 106 98  Resp: 18 20 20 20   Temp:  98.4 F (36.9 C) 97.9 F (36.6 C) 98.4 F (36.9 C)  TempSrc:  Oral Oral Oral  SpO2: 100% 97% 100% 99%  Weight:      Height:        Body mass index is 42.91 kg/m.   General exam: Pleasant, middle-aged Caucasian female. Skin: No rashes, lesions or ulcers. HEENT: Atraumatic, normocephalic, no obvious bleeding.  Bandage present over I&D site on the left side of neck Lungs: Clear to auscultation bilaterally CVS: Regular rate and rhythm, no murmur GI/Abd soft, nontender, nondistended, bowel sound present CNS: Alert, awake, oriented x 3 Psychiatry: Mood appropriate Extremities: No pedal edema, no calf tenderness  Follow ups:    Follow-up Information     [facility], [facility] Follow up.   Specialty: Home Health Services Why: Home health has been arranged. Contact information: [address] [place] [address] [phone]         Laurann Montana, MD Follow up.   Specialty: Family Medicine Contact information: [address], [address] [place] [address] [phone]         Skotnicki, Lindie Spruce A, DO Follow up.   Specialty: Otolaryngology Contact information: [address] [place] [address] [place] [address] [phone]                 Discharge Instructions:   Discharge Instructions     [facility] pharmacist to adjust dose for Vancomycin, Aminoglycosides and other anti-infective therapies as requested by physician.   Complete by: As directed    [facility] to provide Cath Flo 2mg    Complete by: As directed    Administer for PICC line occlusion and as ordered by physician for other access device issues.   Anaphylaxis Kit: Provided to treat any anaphylactic reaction  to the medication being provided to the patient if First Dose or when requested by physician   Complete by: As directed    Epinephrine 1mg /ml vial / amp: Administer 0.3mg  (0.28ml) subcutaneously once for moderate to severe anaphylaxis, nurse to call physician and pharmacy when reaction occurs and call 911 if needed for immediate care   Diphenhydramine 50mg /ml IV vial: Administer 25-50mg  IV/IM PRN for first dose reaction, rash, itching, mild reaction, nurse to call physician and pharmacy when reaction occurs   Sodium Chloride 0.9% NS IV: Administer if needed for hypovolemic blood pressure drop or as ordered by physician after call to physician with anaphylactic reaction  Call MD for:  difficulty breathing, headache or visual disturbances   Complete by: As directed    Call MD for:  extreme fatigue   Complete by: As directed    Call MD for:  hives   Complete by: As directed    Call MD for:  persistant dizziness or light-headedness   Complete by: As directed    Call MD for:  persistant nausea and vomiting   Complete by: As directed    Call MD for:  severe uncontrolled pain   Complete by: As directed    Call MD for:  temperature >100.4   Complete by: As directed    Change dressing on IV access line weekly and PRN   Complete by: As directed    Diet general   Complete by: As directed    Discharge instructions   Complete by: As directed    Recommendations at discharge:   6 weeks of antibiotics with probiotics  5 days of prednisone  Follow-up with ID and ENT as an outpatient.  General discharge instructions: Follow with Primary MD Laurann Montana, MD in 7 days  Please request your PCP  to go over your hospital tests, procedures, radiology results at the follow up. Please get your medicines reviewed and adjusted.  Your PCP may decide to repeat certain labs or tests as needed. Do not drive, operate heavy machinery, perform activities at heights, swimming or participation in water  activities or provide baby sitting services if your were admitted for syncope or siezures until you have seen by Primary MD or a Neurologist and advised to do so again. [place] Controlled Substance Reporting System database was reviewed. Do not drive, operate heavy machinery, perform activities at heights, swim, participate in water activities or provide baby-sitting services while on medications for pain, sleep and mood until your outpatient physician has reevaluated you and advised to do so again.  You are strongly recommended to comply with the dose, frequency and duration of prescribed medications. Activity: As tolerated with Full fall precautions use walker/cane & assistance as needed Avoid using any recreational substances like cigarette, tobacco, alcohol, or non-prescribed drug. If you experience worsening of your admission symptoms, develop shortness of breath, life threatening emergency, suicidal or homicidal thoughts you must seek medical attention immediately by calling 911 or calling your MD immediately  if symptoms less severe. You must read complete instructions/literature along with all the possible adverse reactions/side effects for all the medicines you take and that have been prescribed to you. Take any new medicine only after you have completely understood and accepted all the possible adverse reactions/side effects.  Wear Seat belts while driving. You were cared for by a hospitalist during your hospital stay. If you have any questions about your discharge medications or the care you received while you were in the hospital after you are discharged, you can call the unit and ask to speak with the hospitalist or the covering physician. Once you are discharged, your primary care physician will handle any further medical issues. Please note that NO REFILLS for any discharge medications will be authorized once you are discharged, as it is imperative that you return to your primary care  physician (or establish a relationship with a primary care physician if you do not have one).   Discharge wound care:   Complete by: As directed    Flush IV access with Sodium Chloride 0.9% and Heparin 10 units/ml or 100 units/ml   Complete by: As directed  Home infusion instructions - [facility]   Complete by: As directed    Instructions: Flush IV access with Sodium Chloride 0.9% and Heparin 10units/ml or 100units/ml   Change dressing on IV access line: Weekly and PRN   Instructions Cath Flo 2mg : Administer for PICC Line occlusion and as ordered by physician for other access device   [facility] pharmacist to adjust dose for: Vancomycin, Aminoglycosides and other anti-infective therapies as requested by physician   Increase activity slowly   Complete by: As directed    Method of administration may be changed at the discretion of home infusion pharmacist based upon assessment of the patient and/or caregiver's ability to self-administer the medication ordered   Complete by: As directed        Discharge Medications:   Allergies as of 07/27/2022       Reactions   Aspirin Other (See Comments)   Ulcerative colitis, abdominal pain   Ceftin [cefuroxime] Other (See Comments)   Diarrhea, abdominal cramping   Codeine Other (See Comments)   Severe headaches   Lactose Other (See Comments)   Dairy - sets off my colitis   Other Rash, Other (See Comments)   Contact metal agents         Medication List     STOP taking these medications    atorvastatin 20 MG tablet Commonly known as: LIPITOR   clindamycin 150 MG capsule Commonly known as: CLEOCIN   fluconazole 150 MG tablet Commonly known as: DIFLUCAN   ibuprofen 200 MG tablet Commonly known as: ADVIL   metroNIDAZOLE 500 MG tablet Commonly known as: FLAGYL       TAKE these medications    acetaminophen 500 MG tablet Commonly known as: TYLENOL Take 1,000 mg by mouth every 6 (six) hours as needed  for moderate pain.   amoxicillin-clavulanate 875-125 MG tablet Commonly known as: AUGMENTIN Take 1 tablet by mouth every 12 (twelve) hours.   buPROPion 150 MG 24 hr tablet Commonly known as: WELLBUTRIN XL Take 150 mg by mouth every morning.   Calcium Carbonate-Vitamin D 600-400 MG-UNIT tablet Take 1 tablet by mouth at bedtime.   cetirizine 10 MG tablet Commonly known as: ZYRTEC Take 10 mg by mouth daily.   cyclobenzaprine 10 MG tablet Commonly known as: FLEXERIL Take 10 mg by mouth 3 (three) times daily as needed for muscle spasms.   daptomycin  IVPB Commonly known as: CUBICIN Inject 700 mg into the vein daily. Indication:  Mandibular abscess  First Dose: Yes Last Day of Therapy:  09/01/22 Labs - Once weekly:  CBC/D, BMP, and CPK Labs - Every other week:  ESR and CRP Method of administration: IV Push Method of administration may be changed at the discretion of home infusion pharmacist based upon assessment of the patient and/or caregiver's ability to self-administer the medication ordered.   DULoxetine 60 MG capsule Commonly known as: CYMBALTA Take 1 capsule (60 mg total) by mouth daily.   ferrous sulfate 325 (65 FE) MG tablet Take 1 tablet (325 mg total) by mouth daily with breakfast. Start taking on: Jul 28, 2022   gabapentin 300 MG capsule Commonly known as: NEURONTIN Take 1 capsule (300 mg total) by mouth 3 (three) times daily.   hydrOXYzine 25 MG tablet Commonly known as: ATARAX Take 1 tablet (25 mg total) by mouth 3 (three) times daily as needed for anxiety.   latanoprost 0.005 % ophthalmic solution Commonly known as: XALATAN Place 1 drop into both eyes at bedtime.  mesalamine 1.2 g EC tablet Commonly known as: LIALDA Take 1.2 g by mouth daily.   multivitamin with minerals Tabs tablet Take 1 tablet by mouth daily.   oxyCODONE 5 MG immediate release tablet Commonly known as: Oxy IR/ROXICODONE Take 1 tablet (5 mg total) by mouth every 4 (four) hours as  needed for up to 5 days for moderate pain or breakthrough pain.   predniSONE 10 MG tablet Commonly known as: DELTASONE Take 1 tablet (10 mg total) by mouth daily for 5 days.   QUEtiapine 25 MG tablet Commonly known as: SEROQUEL Take 1 tablet (25 mg total) by mouth 3 (three) times daily.   rizatriptan 10 MG tablet Commonly known as: MAXALT Take 10 mg by mouth as needed for migraine. May repeat in 2 hours if needed   saccharomyces boulardii 250 MG capsule Commonly known as: FLORASTOR Take 1 capsule (250 mg total) by mouth 2 (two) times daily.   topiramate 50 MG tablet Commonly known as: TOPAMAX Take 1 tablet (50 mg total) by mouth every 12 (twelve) hours.               Discharge Care Instructions  (From admission, onward)           Start     Ordered   07/27/22 0000  Change dressing on IV access line weekly and PRN  (Home infusion instructions - [facility] )        07/27/22 1256   07/27/22 0000  Discharge wound care:        07/27/22 1257             The results of significant diagnostics from this hospitalization (including imaging, microbiology, ancillary and laboratory) are listed below for reference.    Procedures and Diagnostic Studies:   CT Soft Tissue Neck W Contrast  Result Date: 07/16/2022 CLINICAL DATA:  Swelling of the left side of the neck following tooth extraction 2 weeks ago. EXAM: CT NECK WITH CONTRAST TECHNIQUE: Multidetector CT imaging of the neck was performed using the standard protocol following the bolus administration of intravenous contrast. RADIATION DOSE REDUCTION: This exam was performed according to the departmental dose-optimization program which includes automated exposure control, adjustment of the mA and/or kV according to patient size and/or use of iterative reconstruction technique. CONTRAST:  75mL OMNIPAQUE IOHEXOL 300 MG/ML  SOLN COMPARISON:  None Available. FINDINGS: Pharynx and larynx: No mucosal lesion. There are  inflammatory changes at the floor of the mouth on the left adjacent to the medial aspect of the body of the mandible consistent with infectious inflammatory inflammation. This is presumably subsequent to the recent extraction of tooth 18. No sign of bone destruction however. There is mild inflammatory enlargement of the adjacent submandibular gland. There is nonspecific inflammatory edema of the soft tissues underneath the chin and of the left side of the lower face and upper neck. Salivary glands: Parotid glands are normal. As above, there is probably some secondary inflammation of the left submandibular gland. Thyroid: Normal Lymph nodes: Reactive level 1 nodal enlargement on the left without suppuration. Vascular: No septic thrombosis.  No arterial disease. Limited intracranial: Normal Visualized orbits: Normal Mastoids and visualized paranasal sinuses: Clear Skeleton: Otherwise no significant skeletal finding. Upper chest: Normal Other: None IMPRESSION: Inflammatory changes at the floor of the mouth on the left adjacent to the medial aspect of the body of the mandible. This is presumably subsequent to the recent extraction of tooth 18. No sign of bone destruction however. There  is nonspecific inflammatory edema of the soft tissues underneath the chin and of the left side of the lower face and upper neck. Reactive level 1 nodal enlargement on the left without suppuration. Mild secondary inflammatory enlargement of the left submandibular gland. Electronically Signed   By: Paulina Fusi M.D.   On: 07/16/2022 17:15   DG Chest Port 1 View  Result Date: 07/16/2022 CLINICAL DATA:  Sepsis. EXAM: PORTABLE CHEST 1 VIEW COMPARISON:  None Available. FINDINGS: Clear lungs. Normal heart size and mediastinal contours. No pleural effusion or pneumothorax. Visualized bones and upper abdomen are unremarkable. IMPRESSION: No evidence of acute cardiopulmonary disease. Electronically Signed   By: Orvan Falconer M.D.   On:  07/16/2022 15:11     Labs:   Basic Metabolic Panel: Recent Labs  Lab 07/22/22 0145 07/23/22 0530 07/24/22 0225 07/25/22 0915 07/27/22 0604  NA 136 136 135 134* 134*  K 4.1 3.2* 3.9 3.2* 3.6  CL 106 103 107 101 100  CO2 22 21* 23 24 24   GLUCOSE 129* 107* 82 129* 103*  BUN 10 16 14 11 7   CREATININE 0.72 0.85 0.83 0.92 0.84  CALCIUM 8.4* 8.5* 7.9* 8.2* 8.2*   GFR Estimated Creatinine Clearance: 110.9 mL/min (by C-G formula based on SCr of 0.84 mg/dL). Liver Function Tests: No results for input(s): "AST", "ALT", "ALKPHOS", "BILITOT", "PROT", "ALBUMIN" in the last 168 hours. No results for input(s): "LIPASE", "AMYLASE" in the last 168 hours. No results for input(s): "AMMONIA" in the last 168 hours. Coagulation profile No results for input(s): "INR", "PROTIME" in the last 168 hours.  CBC: Recent Labs  Lab 07/22/22 0145 07/23/22 0530 07/24/22 0225 07/25/22 0915 07/27/22 0604  WBC 26.2* 29.6* 19.5* 19.1* 15.5*  NEUTROABS 24.2* 26.3* 12.5* 13.4* 12.6*  HGB 9.0* 9.9* 8.9* 9.8* 10.2*  HCT 29.2* 31.7* 29.4* 32.6* 33.5*  MCV 72.8* 73.5* 74.2* 75.3* 74.4*  PLT 562* 652* 598* 639* 567*   Cardiac Enzymes: Recent Labs  Lab 07/25/22 0915  CKTOTAL 36*   BNP: Invalid input(s): "POCBNP" CBG: No results for input(s): "GLUCAP" in the last 168 hours. D-Dimer No results for input(s): "DDIMER" in the last 72 hours. Hgb A1c No results for input(s): "HGBA1C" in the last 72 hours. Lipid Profile No results for input(s): "CHOL", "HDL", "LDLCALC", "TRIG", "CHOLHDL", "LDLDIRECT" in the last 72 hours. Thyroid function studies No results for input(s): "TSH", "T4TOTAL", "T3FREE", "THYROIDAB" in the last 72 hours.  Invalid input(s): "FREET3" Anemia work up No results for input(s): "VITAMINB12", "FOLATE", "FERRITIN", "TIBC", "IRON", "RETICCTPCT" in the last 72 hours. Microbiology Recent Results (from the past 240 hour(s))  Aerobic/Anaerobic Culture w Gram Stain (surgical/deep wound)      Status: None   Collection Time: 07/19/22  1:00 PM   Specimen: Neck, Left; Abscess  Result Value Ref Range Status   Specimen Description ABSCESS LEFT NECK  Final   Special Requests NONE  Final   Gram Stain   Final    FEW WBC PRESENT,BOTH PMN AND MONONUCLEAR RARE GRAM POSITIVE COCCI    Culture   Final    RARE STREPTOCOCCUS INTERMEDIUS NO ANAEROBES ISOLATED Performed at [facility], [address], [place], [place] [address]    Report Status 07/24/2022 FINAL  Final   Organism ID, Bacteria STREPTOCOCCUS INTERMEDIUS  Final      Susceptibility   Streptococcus intermedius - MIC*    PENICILLIN <=0.06 SENSITIVE Sensitive     CEFTRIAXONE <=0.12 SENSITIVE Sensitive     ERYTHROMYCIN >=8 RESISTANT Resistant  LEVOFLOXACIN <=0.25 SENSITIVE Sensitive     VANCOMYCIN 0.5 SENSITIVE Sensitive     * RARE STREPTOCOCCUS INTERMEDIUS  Gastrointestinal Panel by PCR , Stool     Status: None   Collection Time: 07/27/22  7:09 AM   Specimen: Stool  Result Value Ref Range Status   Campylobacter species NOT DETECTED NOT DETECTED Final   Plesimonas shigelloides NOT DETECTED NOT DETECTED Final   Salmonella species NOT DETECTED NOT DETECTED Final   Yersinia enterocolitica NOT DETECTED NOT DETECTED Final   Vibrio species NOT DETECTED NOT DETECTED Final   Vibrio cholerae NOT DETECTED NOT DETECTED Final   Enteroaggregative E coli (EAEC) NOT DETECTED NOT DETECTED Final   Enteropathogenic E coli (EPEC) NOT DETECTED NOT DETECTED Final   Enterotoxigenic E coli (ETEC) NOT DETECTED NOT DETECTED Final   Shiga like toxin producing E coli (STEC) NOT DETECTED NOT DETECTED Final   Shigella/Enteroinvasive E coli (EIEC) NOT DETECTED NOT DETECTED Final   Cryptosporidium NOT DETECTED NOT DETECTED Final   Cyclospora cayetanensis NOT DETECTED NOT DETECTED Final   Entamoeba histolytica NOT DETECTED NOT DETECTED Final   Giardia lamblia NOT DETECTED NOT DETECTED Final   Adenovirus F40/41 NOT DETECTED NOT DETECTED  Final   Astrovirus NOT DETECTED NOT DETECTED Final   Norovirus GI/GII NOT DETECTED NOT DETECTED Final   Rotavirus A NOT DETECTED NOT DETECTED Final   Sapovirus (I, II, IV, and V) NOT DETECTED NOT DETECTED Final    Comment: Performed at [facility], [address], [place], [place] [address]    Time coordinating discharge: 45 minutes  Signed: Melina Schools Arantza Darrington  [facility] 07/27/2022, 12:57 PM

## 2022-07-27 NOTE — Progress Notes (Signed)
Peripherally Inserted Central Catheter Placement  The IV Nurse has discussed with the patient and/or persons authorized to consent for the patient, the purpose of this procedure and the potential benefits and risks involved with this procedure.  The benefits include less needle sticks, lab draws from the catheter, and the patient may be discharged home with the catheter. Risks include, but not limited to, infection, bleeding, blood clot (thrombus formation), and puncture of an artery; nerve damage and irregular heartbeat and possibility to perform a PICC exchange if needed/ordered by physician.  Alternatives to this procedure were also discussed.  Bard Power PICC patient education guide, fact sheet on infection prevention and patient information card has been provided to patient /or left at bedside.    PICC Placement Documentation  PICC Single Lumen 07/27/22 Right Basilic 39 cm 0 cm (Active)  Indication for Insertion or Continuance of Line Home intravenous therapies (PICC only) 07/27/22 1625  Exposed Catheter (cm) 0 cm 07/27/22 1625  Site Assessment Clean, Dry, Intact 07/27/22 1625  Line Status Flushed;Saline locked;Blood return noted 07/27/22 1625  Dressing Type Transparent;Securing device 07/27/22 1625  Dressing Status Antimicrobial disc in place 07/27/22 1625  Safety Lock Not Applicable 07/27/22 1625  Line Adjustment (NICU/IV Team Only) No 07/27/22 1625  Dressing Change Due 08/03/22 07/27/22 1625       Rebekah Silva, Rebekah Silva 07/27/2022, 4:31 PM

## 2022-07-31 ENCOUNTER — Telehealth: Payer: Self-pay

## 2022-07-31 NOTE — Telephone Encounter (Signed)
Patient's nurse with [facility] called stating the patient is requesting a note from our office to return to work. Cambrie Sonnenfeld T Pricilla Loveless

## 2022-08-05 LAB — LAB REPORT - SCANNED: EGFR: 74

## 2022-08-15 ENCOUNTER — Telehealth: Payer: Self-pay

## 2022-08-15 ENCOUNTER — Ambulatory Visit (INDEPENDENT_AMBULATORY_CARE_PROVIDER_SITE_OTHER): Payer: BC Managed Care – PPO | Admitting: Internal Medicine

## 2022-08-15 ENCOUNTER — Encounter: Payer: Self-pay | Admitting: Internal Medicine

## 2022-08-15 ENCOUNTER — Other Ambulatory Visit: Payer: Self-pay

## 2022-08-15 VITALS — BP 107/77 | HR 108 | Resp 16 | Ht 67.0 in | Wt 276.0 lb

## 2022-08-15 DIAGNOSIS — K122 Cellulitis and abscess of mouth: Secondary | ICD-10-CM | POA: Diagnosis not present

## 2022-08-15 LAB — LAB REPORT - SCANNED: EGFR: 88

## 2022-08-15 NOTE — Telephone Encounter (Signed)
Per Dr Thedore Mins:  Extending IV Abx until 09/11/22.    Notified [facility] and Julianne Rice   Verbally verified extended end date of 09/11/22 with provider.

## 2022-08-15 NOTE — Progress Notes (Unsigned)
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   AMOXICILLIN-CLAVULANATE (AUGMENTIN) 875-125 MG TABLET    Take 1 tablet by mouth every 12 (twelve) hours.   ATORVASTATIN (LIPITOR) 20 MG TABLET    Take 20 mg by mouth daily.   BUPROPION (WELLBUTRIN XL) 150 MG 24 HR TABLET    Take 150 mg by mouth every morning.   CALCIUM CARBONATE-VITAMIN D 600-400 MG-UNIT TABLET    Take 1 tablet by mouth at bedtime.   CETIRIZINE (ZYRTEC) 10 MG TABLET    Take 10 mg by mouth daily.   CYCLOBENZAPRINE (FLEXERIL) 10 MG TABLET    Take 10 mg by mouth 3 (three) times daily as needed for muscle spasms.   DAPTOMYCIN (CUBICIN) IVPB    Inject 700 mg into the vein daily. Indication:  Mandibular abscess  First Dose: Yes Last Day of Therapy:  09/01/22 Labs - Once weekly:  CBC/D, BMP, and CPK Labs - Every other week:  ESR and CRP Method of administration: IV Push Method of administration may be changed at the discretion of home infusion pharmacist based upon assessment of the patient and/or caregiver's ability to self-administer the medication ordered.   DULOXETINE (CYMBALTA) 60 MG CAPSULE    Take 1 capsule (60 mg total) by mouth daily.   FERROUS SULFATE 325 (65 FE) MG TABLET    Take 1 tablet (325 mg total) by mouth daily with breakfast.   GABAPENTIN (NEURONTIN) 300 MG CAPSULE    Take 1 capsule (300 mg  total) by mouth 3 (three) times daily.   HYDROXYZINE (ATARAX) 25 MG TABLET    Take 1 tablet (25 mg total) by mouth 3 (three) times daily as needed for anxiety.   LATANOPROST (XALATAN) 0.005 % OPHTHALMIC SOLUTION    Place 1 drop into both eyes at bedtime.   MESALAMINE (LIALDA) 1.2 G EC TABLET    Take 1.2 g by mouth daily.   MULTIPLE VITAMIN (MULTIVITAMIN WITH MINERALS) TABS TABLET    Take 1 tablet by mouth daily.   QUETIAPINE (SEROQUEL) 25 MG TABLET    Take 1 tablet (25 mg total) by mouth 3 (three) times daily.   RIZATRIPTAN (MAXALT) 10 MG TABLET    Take 10 mg by mouth as needed for migraine. May repeat in 2 hours if needed   SACCHAROMYCES BOULARDII (FLORASTOR) 250 MG CAPSULE    Take 1 capsule (250 mg total) by mouth 2 (two) times daily.   TOPIRAMATE (TOPAMAX) 50 MG TABLET    Take 1 tablet (50 mg total) by mouth every 12 (twelve) hours.  Modified Medications   No medications on file  Discontinued Medications   No medications on file    Subjective: 52 year old female with past medical history of pleural including ulcerative colitis on Remicade presents for hospital follow-up of mandibular abscess.  She initially underwent root canal while being  on clindamycin complicated by swelling and pain.  Tooth was then extracted.  Following tooth extraction continue to have pain, edema.  Noticed bump under her tongue and swelling around the ear.  She has been on clinda for about 3 to 4 weeks.  On arrival to ED she had leukocytosis started on IV antibiotics.  MRI showed 3.2X 1.2 cm abscess deep to the left mandibular angle.  Large areas of inflammation extending into pharyngeal.  Left facial cellulitis and myositis.  Taken to the OR ENT for debridement with cultures growing strep intermedius on 5/8.  Infectious disease was engaged and patient placed on daptomycin and Augmentin.  EOT of antibiotics 09/01/2022.  She did have loose stool during hospitalization as Remicade was being held.   Today 08/15/2022: Denies any  breakthrough diarrhea.  She reports she is communicated with rheumatology for Remicade.  Tolerating antibiotics.  No fevers or chills.  Review of Systems: Review of Systems  All other systems reviewed and are negative.   Past Medical History:  Diagnosis Date   Anxiety    Arthritis    ra   Colitis    Depression    GERD (gastroesophageal reflux disease)    Hypercholesterolemia    DR. WHITE   Migraines    PIH (pregnancy induced hypertension) yrs ago, none since   PONV (postoperative nausea and vomiting)    Wears glasses     Social History   Tobacco Use   Smoking status: Never   Smokeless tobacco: Never  Vaping Use   Vaping Use: Never used  Substance Use Topics   Alcohol use: Yes    Alcohol/week: 0.0 standard drinks of alcohol    Comment: occ   Drug use: Yes    Types: Marijuana    Family History  Problem Relation Age of Onset   Anxiety disorder Mother    Depression Mother    Breast cancer Mother        mastectomy- all through the ducts of left breast    Hypertension Father    Breast cancer Maternal Aunt    Cancer Maternal Grandfather        colon?   Diabetes Paternal Grandmother    Heart disease Paternal Grandmother     Allergies  Allergen Reactions   Aspirin Other (See Comments)    Ulcerative colitis, abdominal pain   Ceftin [Cefuroxime] Other (See Comments)    Diarrhea, abdominal cramping   Codeine Other (See Comments)    Severe headaches   Lactose Other (See Comments)    Dairy - sets off my colitis   Other Rash and Other (See Comments)    Contact metal agents     Health Maintenance  Topic Date Due   COVID-19 Vaccine (1) Never done   DTaP/Tdap/Td (1 - Tdap) Never done   PAP SMEAR-Modifier  03/07/2018   INFLUENZA VACCINE  10/12/2022   MAMMOGRAM  07/05/2023   Colonoscopy  04/14/2026   Hepatitis C Screening  Completed   HIV Screening  Completed   HPV VACCINES  Aged Out    Objective:  There were no vitals filed for this visit. There is no  height or weight on file to calculate BMI.  Physical Exam Constitutional:      Appearance: Normal appearance.  HENT:     Head: Normocephalic and atraumatic.     Right Ear: Tympanic membrane normal.     Left Ear: Tympanic membrane normal.     Nose: Nose normal.     Mouth/Throat:  Mouth: Mucous membranes are moist.  Eyes:     Extraocular Movements: Extraocular movements intact.     Conjunctiva/sclera: Conjunctivae normal.     Pupils: Pupils are equal, round, and reactive to light.  Cardiovascular:     Rate and Rhythm: Normal rate and regular rhythm.     Heart sounds: No murmur heard.    No friction rub. No gallop.  Pulmonary:     Effort: Pulmonary effort is normal.     Breath sounds: Normal breath sounds.  Abdominal:     General: Abdomen is flat.     Palpations: Abdomen is soft.  Musculoskeletal:        General: Normal range of motion.  Skin:    General: Skin is warm and dry.  Neurological:     General: No focal deficit present.     Mental Status: Rebekah Silva is alert and oriented to person, place, and time.  Psychiatric:        Mood and Affect: Mood normal.     Lab Results Lab Results  Component Value Date   WBC 15.5 (H) 07/27/2022   HGB 10.2 (L) 07/27/2022   HCT 33.5 (L) 07/27/2022   MCV 74.4 (L) 07/27/2022   PLT 567 (H) 07/27/2022    Lab Results  Component Value Date   CREATININE 0.84 07/27/2022   BUN 7 07/27/2022   NA 134 (L) 07/27/2022   K 3.6 07/27/2022   CL 100 07/27/2022   CO2 24 07/27/2022    Lab Results  Component Value Date   ALT 18 07/18/2022   AST 16 07/18/2022   ALKPHOS 92 07/18/2022   BILITOT 0.4 07/18/2022    Lab Results  Component Value Date   CHOL 220 (H) 05/25/2021   HDL 55 05/25/2021   LDLCALC 139 (H) 05/25/2021   TRIG 132 05/25/2021   CHOLHDL 4.0 05/25/2021   No results found for: "LABRPR", "RPRTITER" No results found for: "HIV1RNAQUANT", "HIV1RNAVL", "CD4TABS"   Problem List Items Addressed This Visit    None  Assessment/Plan 52 year old female who root canal and tooth extraction several weeks ago complicated with pain and swelling admitted with:     #Tooth 18 extraction complicated by mandibular abscess status post I&D on 5/8 with Cx+ Strep intemedius #Extraction complicated by cellulitis as well -Patient initially had a infected tooth for which she was placed on clindamycin.  Few days later underwent a root canal which was complicated with swelling and pain.  Then she underwent extraction of tooth continue to have edema/pain.  Found to have mandibular abscess and underwent debridement by ENT.  She had been on long-term clindamycin about 3 to 4 weeks at that point.  Or cultures on 5/8 grew strep intermedius.  Opted to treat with broad-spectrum Dapto and Augmentin since patient has been on clindamycin for prolonged course. -Tolerating abx with daptomycin Augmentin x 6 weeks from the OR on 5/8 -Counseled pt on ENT f/u prior to 7/1 visit -Wound has improved significanly, mininal drianage. Will extend abx till 7/1 F/U (prior EOT 6/21).   #Medication monitoring CK 83, crp 7, esr, wbc 10.8, scr 0.82 on 5/30, esr 87 on 08/04/22   #UC on Remicade(held) -No break out diarrhea episode since discharge -Pt states communicated with Rheum and counseled to call after abx complete. Remicade held one week past abx  OPAT ORDERS:   Diagnosis: mandibular abscess   Culture Result: Strep intermedius        Allergies  Allergen Reactions   Aspirin Other (See  Comments)      Ulcerative colitis, abdominal pain   Ceftin [Cefuroxime] Other (See Comments)      Diarrhea, abdominal cramping   Codeine Other (See Comments)      Severe headaches   Lactose Other (See Comments)      Dairy - sets off my colitis   Other Rash and Other (See Comments)      Contact metal agents         Discharge antibiotics to be given via PICC line:   Per pharmacy protocol dapto 700mg  IV q 24h and  Augmentin 875/125 mg PO  bid     Duration: 7.5 weeks End Date: 09/11/22   [facility] Per Protocol with [employer] Use: Home health RN for IV administration and teaching, line care and labs.     Labs weekly while on IV antibiotics: x__ CBC with differential __ BMP **TWICE WEEKLY ON VANCOMYCIN  _x_ CMP _xx_ CRP __x ESR __ Vancomycin trough TWICE WEEKLY _x_ CK   __ Please pull PIC at completion of IV antibiotics __x Please leave PIC in place until doctor has seen patient or been notified   Fax weekly labs to [phone]   Clinic Follow Up Appt: 09/11/22   @ [facility] with Milana Huntsman, MD [facility] 08/15/2022, 10:27 AM   I have personally spent 42 minutes involved in face-to-face and non-face-to-face activities for this patient on the day of the visit. Professional time spent includes the following activities: Preparing to see the patient (review of tests), Obtaining and/or reviewing separately obtained history (admission/discharge record), Performing a medically appropriate examination and/or evaluation , Ordering medications/tests/procedures, referring and communicating with other health care professionals, Documenting clinical information in the EMR, Independently interpreting results (not separately reported), Communicating results to the patient/family/caregiver, Counseling and educating the patient/family/caregiver and Care coordination (not separately reported).

## 2022-08-19 LAB — LAB REPORT - SCANNED: EGFR: 88

## 2022-08-22 ENCOUNTER — Encounter: Payer: Self-pay | Admitting: Internal Medicine

## 2022-08-26 LAB — LAB REPORT - SCANNED: EGFR: 81

## 2022-08-30 LAB — LAB REPORT - SCANNED: EGFR: 102

## 2022-09-06 LAB — LAB REPORT - SCANNED: EGFR: 106

## 2022-09-07 ENCOUNTER — Encounter: Payer: Self-pay | Admitting: Internal Medicine

## 2022-09-11 ENCOUNTER — Other Ambulatory Visit: Payer: Self-pay

## 2022-09-11 ENCOUNTER — Telehealth: Payer: Self-pay

## 2022-09-11 ENCOUNTER — Ambulatory Visit (INDEPENDENT_AMBULATORY_CARE_PROVIDER_SITE_OTHER): Payer: BC Managed Care – PPO | Admitting: Internal Medicine

## 2022-09-11 ENCOUNTER — Encounter: Payer: Self-pay | Admitting: Internal Medicine

## 2022-09-11 VITALS — BP 94/69 | HR 92 | Temp 97.9°F | Ht 67.0 in | Wt 275.0 lb

## 2022-09-11 DIAGNOSIS — K122 Cellulitis and abscess of mouth: Secondary | ICD-10-CM

## 2022-09-11 NOTE — Progress Notes (Signed)
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  She initially underwent root canal while being on clindamycin complicated by swelling and pain.  Tooth was then extracted.  Following tooth extraction continue to have pain, edema.  Noticed bump under her tongue and swelling around the ear.  She has been on clinda for about 3 to 4 weeks.  On arrival to ED she had leukocytosis started on IV antibiotics.  MRI showed 3.2X 1.2 cm abscess deep to the left mandibular angle.  Large areas of inflammation extending into pharyngeal.  Left facial cellulitis and myositis.  Taken to the OR ENT for debridement with cultures growing strep intermedius on 5/8.  Infectious disease was engaged and patient placed on daptomycin and Augmentin.  EOT of antibiotics 09/01/2022.  She did have loose stool during hospitalization as Remicade was being held.    08/15/2022: Denies any breakthrough diarrhea.  She reports she is communicated with rheumatology for Remicade.  Tolerating antibiotics.  No fevers or chills.  Today 09/11/22: Pt reports she has seen ENT. Reports she has some numbness around inside of her jaw. No fever or chills. Has seen dentistry as well.   Review of Systems  All other systems reviewed and are negative.   Past Medical History:  Diagnosis  Date   Anxiety    Arthritis    ra   Colitis    Depression    GERD (gastroesophageal reflux disease)    Hypercholesterolemia    DR. WHITE   Migraines    PIH (pregnancy induced hypertension) yrs ago, none since   PONV (postoperative nausea and vomiting)    Wears glasses     Outpatient Medications Prior to Visit  Medication Sig Dispense Refill   acetaminophen (TYLENOL) 500 MG tablet Take 1,000 mg by mouth every 6 (six) hours as needed for moderate pain.     atorvastatin (LIPITOR) 20 MG tablet Take 20 mg by mouth daily.     buPROPion (WELLBUTRIN XL) 150 MG 24 hr tablet Take 150 mg by mouth every morning.     Calcium Carbonate-Vitamin D 600-400 MG-UNIT tablet Take 1 tablet by mouth at bedtime.     cetirizine (ZYRTEC) 10 MG tablet Take 10 mg by mouth daily.     cyclobenzaprine (FLEXERIL) 10 MG tablet Take 10 mg by mouth 3 (three) times daily as needed for muscle spasms.     DULoxetine (CYMBALTA) 60 MG capsule Take 1 capsule (60 mg total) by mouth daily. 30 capsule 0   ferrous sulfate 325 (65 FE) MG tablet Take 1 tablet (325 mg total) by mouth daily with breakfast. 90 tablet 0   gabapentin (NEURONTIN) 300 MG capsule Take 1 capsule (300 mg total) by mouth 3 (three) times daily. 90 capsule 0   hydrOXYzine (ATARAX) 25 MG tablet Take 1 tablet (25 mg total) by mouth 3 (three) times daily as  needed for anxiety. 30 tablet 0   latanoprost (XALATAN) 0.005 % ophthalmic solution Place 1 drop into both eyes at bedtime.     mesalamine (LIALDA) 1.2 G EC tablet Take 1.2 g by mouth daily.     Multiple Vitamin (MULTIVITAMIN WITH MINERALS) TABS tablet Take 1 tablet by mouth daily.     QUEtiapine (SEROQUEL) 25 MG tablet Take 1 tablet (25 mg total) by mouth 3 (three) times daily. 90 tablet 0   rizatriptan (MAXALT) 10 MG tablet Take 10 mg by mouth as needed for migraine. May repeat in 2 hours if needed     topiramate (TOPAMAX) 50 MG tablet Take 1 tablet (50 mg total) by mouth every 12 (twelve) hours. 60 tablet 0    Facility-Administered Medications Prior to Visit  Medication Dose Route Frequency Provider Last Rate Last Admin   levonorgestrel (MIRENA) 20 MCG/24HR IUD   Intrauterine Once Ok Edwards, MD         Allergies  Allergen Reactions   Aspirin Other (See Comments)    Ulcerative colitis, abdominal pain   Ceftin [Cefuroxime] Other (See Comments)    Diarrhea, abdominal cramping   Codeine Other (See Comments)    Severe headaches   Lactose Other (See Comments)    Dairy - sets off my colitis   Other Rash and Other (See Comments)    Contact metal agents     Social History   Tobacco Use   Smoking status: Never   Smokeless tobacco: Never  Vaping Use   Vaping Use: Never used  Substance Use Topics   Alcohol use: Yes    Alcohol/week: 0.0 standard drinks of alcohol    Comment: occ   Drug use: Yes    Types: Marijuana    Family History  Problem Relation Age of Onset   Anxiety disorder Mother    Depression Mother    Breast cancer Mother        mastectomy- all through the ducts of left breast    Hypertension Father    Breast cancer Maternal Aunt    Cancer Maternal Grandfather        colon?   Diabetes Paternal Grandmother    Heart disease Paternal Grandmother     Objective:  There were no vitals filed for this visit. There is no height or weight on file to calculate BMI.  Physical Exam Constitutional:      Appearance: Normal appearance.  HENT:     Head: Normocephalic and atraumatic.     Right Ear: Tympanic membrane normal.     Left Ear: Tympanic membrane normal.     Nose: Nose normal.     Mouth/Throat:     Mouth: Mucous membranes are moist.  Eyes:     Extraocular Movements: Extraocular movements intact.     Conjunctiva/sclera: Conjunctivae normal.     Pupils: Pupils are equal, round, and reactive to light.  Cardiovascular:     Rate and Rhythm: Normal rate and regular rhythm.     Heart sounds: No murmur heard.    No friction rub. No gallop.  Pulmonary:      Effort: Pulmonary effort is normal.     Breath sounds: Normal breath sounds.  Abdominal:     General: Abdomen is flat.     Palpations: Abdomen is soft.  Musculoskeletal:        General: Normal range of motion.  Skin:    General: Skin is warm and dry.  Neurological:     General: No focal  deficit present.     Mental Status: Sherol Cantarero Andrew is alert and oriented to person, place, and time.  Psychiatric:        Mood and Affect: Mood normal.     Lab Results: Lab Results  Component Value Date   WBC 15.5 (H) 07/27/2022   HGB 10.2 (L) 07/27/2022   HCT 33.5 (L) 07/27/2022   MCV 74.4 (L) 07/27/2022   PLT 567 (H) 07/27/2022    Lab Results  Component Value Date   CREATININE 0.84 07/27/2022   BUN 7 07/27/2022   NA 134 (L) 07/27/2022   K 3.6 07/27/2022   CL 100 07/27/2022   CO2 24 07/27/2022    Lab Results  Component Value Date   ALT 18 07/18/2022   AST 16 07/18/2022   ALKPHOS 92 07/18/2022   BILITOT 0.4 07/18/2022     Assessment & Plan:   52 year old female who root canal and tooth extraction several weeks ago complicated with pain and swelling admitted with:     #Tooth 18 extraction complicated by mandibular abscess status post I&D on 5/8 with Cx+ Strep intemedius #Extraction complicated by cellulitis as well -Patient initially had a infected tooth for which she was placed on clindamycin.  Few days later underwent a root canal which was complicated with swelling and pain.  Then she underwent extraction of tooth continue to have edema/pain.  Found to have mandibular abscess and underwent debridement by ENT.  She had been on long-term clindamycin about 3 to 4 weeks at that point.  Or cultures on 5/8 grew strep intermedius.  Opted to treat with broad-spectrum Dapto and Augmentin since patient has been on clindamycin for prolonged course.  Seen by ENT Dr. Thyra Breed on 6/12.  Noted she responded well to antibiotics.  Exam demonstrated healing of surgical incision with only mild  persistent perimandibular induration.  Protruding fragment from patient's previous root canal side encouraged to see dentistry.  Patient still states she has seen dentistry since then, her may need to file down her tooth workup pending. -Completed dapto+ augmentin xabout 6 weeks(last dose of augmentin last week and dapto yesterday). He wound appears healed. Labs stable -Stop abx, pull PICC -F/U with myself on 8/27 to do labs and eval off of antibioics   #Medication monitoring 09/04/22 wbc 8.5,ace0.69,esr 43(43), crp 5(4), CK 62     #UC on Remicade(held) -No break out diarrhea episode since discharge -Pt states communicated with Rheum and counseled to call after abx complete. Remicade held one week past abx  #History of bilateral nonpulsatile tinnitus - ENT recommended hearing test through her convenience.   Danelle Earthly, MD [facility]   09/11/22  1:24 PM

## 2022-09-11 NOTE — Telephone Encounter (Signed)
Per Dr. Thedore Mins, okay to pull PICC. Orders sent to Jeri Modena, RN with [employer].   Sandie Ano, RN

## 2022-09-27 ENCOUNTER — Encounter: Payer: Self-pay | Admitting: Internal Medicine

## 2022-11-07 ENCOUNTER — Other Ambulatory Visit: Payer: Self-pay

## 2022-11-07 ENCOUNTER — Ambulatory Visit (INDEPENDENT_AMBULATORY_CARE_PROVIDER_SITE_OTHER): Payer: BC Managed Care – PPO | Admitting: Internal Medicine

## 2022-11-07 VITALS — BP 103/74 | HR 103 | Resp 16 | Ht 67.0 in | Wt 268.0 lb

## 2022-11-07 DIAGNOSIS — K122 Cellulitis and abscess of mouth: Secondary | ICD-10-CM | POA: Diagnosis not present

## 2022-11-07 NOTE — Progress Notes (Signed)
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   ATORVASTATIN (LIPITOR) 20 MG TABLET    Take 20 mg by mouth daily.   BUPROPION (WELLBUTRIN XL) 150 MG 24 HR TABLET    Take 150 mg by mouth every morning.   CALCIUM CARBONATE-VITAMIN D 600-400 MG-UNIT TABLET    Take 1 tablet by mouth at bedtime.   CETIRIZINE (ZYRTEC) 10 MG TABLET    Take 10 mg by mouth daily.   CYCLOBENZAPRINE (FLEXERIL) 10 MG TABLET    Take 10 mg by mouth 3 (three) times daily as needed for muscle spasms.   DULOXETINE (CYMBALTA) 60 MG CAPSULE    Take 1 capsule (60 mg total) by mouth daily.   FERROUS SULFATE 325 (65 FE) MG TABLET    Take 1 tablet (325 mg total) by mouth daily with breakfast.   GABAPENTIN (NEURONTIN) 300 MG CAPSULE    Take 1 capsule (300 mg total) by mouth 3 (three) times daily.   HYDROXYZINE (ATARAX) 25 MG TABLET    Take 1 tablet (25 mg total) by mouth 3 (three) times daily as needed for anxiety.   LATANOPROST (XALATAN) 0.005 % OPHTHALMIC SOLUTION    Place 1 drop into both eyes at bedtime.   MESALAMINE (LIALDA) 1.2 G EC TABLET    Take 1.2 g by mouth daily.   MULTIPLE VITAMIN (MULTIVITAMIN WITH MINERALS) TABS TABLET    Take 1 tablet by mouth daily.   QUETIAPINE (SEROQUEL) 25 MG TABLET    Take 1 tablet (25 mg total) by mouth 3 (three) times daily.   REMICADE 100 MG INJECTION    Inject into  the vein.   RIZATRIPTAN (MAXALT) 10 MG TABLET    Take 10 mg by mouth as needed for migraine. May repeat in 2 hours if needed   TOPIRAMATE (TOPAMAX) 50 MG TABLET    Take 1 tablet (50 mg total) by mouth every 12 (twelve) hours.  Modified Medications   No medications on file  Discontinued Medications   No medications on file    Subjective: 52 year old female with past medical history of pleural including ulcerative colitis on Remicade presents for hospital follow-up of mandibular abscess.  She initially underwent root canal while being on clindamycin complicated by swelling and pain.  Tooth was then extracted.  Following tooth extraction continue to have pain, edema.  Noticed bump under her tongue and swelling around the ear.  She has been on clinda for about 3 to 4 weeks.  On arrival to ED she had leukocytosis started on IV antibiotics.  MRI showed 3.2X 1.2 cm abscess deep to the left mandibular angle.  Large areas of inflammation extending into pharyngeal.  Left facial cellulitis and myositis.  Taken to the OR ENT for debridement with cultures growing strep intermedius on 5/8.  Infectious disease was engaged and patient placed on daptomycin  and Augmentin.  EOT of antibiotics 09/01/2022.  She did have loose stool during hospitalization as Remicade was being held.    08/15/2022: Denies any breakthrough diarrhea.  She reports she is communicated with rheumatology for Remicade.  Tolerating antibiotics.  No fevers or chills. 09/11/22: Pt reports she has seen ENT. Reports she has some numbness around inside of her jaw. No fever or chills. Has seen dentistry as well.    Today 11/07/22: Still some numbness, doing well off of abx.   Review of Systems: Review of Systems  All other systems reviewed and are negative.   Past Medical History:  Diagnosis Date   Anxiety    Arthritis    ra   Colitis    Depression    GERD (gastroesophageal reflux disease)    Hypercholesterolemia    DR. WHITE   Migraines    PIH  (pregnancy induced hypertension) yrs ago, none since   PONV (postoperative nausea and vomiting)    Wears glasses     Social History   Tobacco Use   Smoking status: Never   Smokeless tobacco: Never  Vaping Use   Vaping status: Never Used  Substance Use Topics   Alcohol use: Not Currently    Comment: occ   Drug use: Yes    Types: Marijuana    Family History  Problem Relation Age of Onset   Anxiety disorder Mother    Depression Mother    Breast cancer Mother        mastectomy- all through the ducts of left breast    Hypertension Father    Breast cancer Maternal Aunt    Cancer Maternal Grandfather        colon?   Diabetes Paternal Grandmother    Heart disease Paternal Grandmother     Allergies  Allergen Reactions   Aspirin Other (See Comments)    Ulcerative colitis, abdominal pain   Ceftin [Cefuroxime] Other (See Comments)    Diarrhea, abdominal cramping   Codeine Other (See Comments)    Severe headaches   Lactose Other (See Comments)    Dairy - sets off my colitis   Other Rash and Other (See Comments)    Contact metal agents     Health Maintenance  Topic Date Due   DTaP/Tdap/Td (1 - Tdap) Never done   PAP SMEAR-Modifier  03/07/2018   COVID-19 Vaccine (1 - 2023-24 season) Never done   INFLUENZA VACCINE  10/12/2022   MAMMOGRAM  07/05/2023   Colonoscopy  04/14/2026   Hepatitis C Screening  Completed   HIV Screening  Completed   HPV VACCINES  Aged Out    Objective:  There were no vitals filed for this visit. There is no height or weight on file to calculate BMI.  Physical Exam Constitutional:      Appearance: Normal appearance.  HENT:     Head: Normocephalic and atraumatic.     Right Ear: Tympanic membrane normal.     Left Ear: Tympanic membrane normal.     Nose: Nose normal.     Mouth/Throat:     Mouth: Mucous membranes are moist.  Eyes:     Extraocular Movements: Extraocular movements intact.     Conjunctiva/sclera: Conjunctivae normal.      Pupils: Pupils are equal, round, and reactive to light.  Cardiovascular:     Rate and Rhythm: Normal rate and regular rhythm.     Heart sounds: No murmur heard.    No friction rub. No gallop.  Pulmonary:  Effort: Pulmonary effort is normal.     Breath sounds: Normal breath sounds.  Abdominal:     General: Abdomen is flat.     Palpations: Abdomen is soft.  Musculoskeletal:        General: Normal range of motion.  Skin:    General: Skin is warm and dry.     Comments: Scarring below jaw  Neurological:     General: No focal deficit present.     Mental Status: Jenine is alert and oriented to person, place, and time.  Psychiatric:        Mood and Affect: Mood normal.     Lab Results Lab Results  Component Value Date   WBC 15.5 (H) 07/27/2022   HGB 10.2 (L) 07/27/2022   HCT 33.5 (L) 07/27/2022   MCV 74.4 (L) 07/27/2022   PLT 567 (H) 07/27/2022    Lab Results  Component Value Date   CREATININE 0.84 07/27/2022   BUN 7 07/27/2022   NA 134 (L) 07/27/2022   K 3.6 07/27/2022   CL 100 07/27/2022   CO2 24 07/27/2022    Lab Results  Component Value Date   ALT 18 07/18/2022   AST 16 07/18/2022   ALKPHOS 92 07/18/2022   BILITOT 0.4 07/18/2022    Lab Results  Component Value Date   CHOL 220 (H) 05/25/2021   HDL 55 05/25/2021   LDLCALC 139 (H) 05/25/2021   TRIG 132 05/25/2021   CHOLHDL 4.0 05/25/2021   No results found for: "LABRPR", "RPRTITER" No results found for: "HIV1RNAQUANT", "HIV1RNAVL", "CD4TABS"   Problem List Items Addressed This Visit   None  Assessment/Plan 52 year old female who root canal and tooth extraction several weeks ago complicated with pain and swelling admitted with:     #Tooth 18 extraction complicated by mandibular abscess status post I&D on 5/8 with Cx+ Strep intemedius #Extraction complicated by cellulitis as well -Patient initially had a infected tooth for which she was placed on clindamycin.  Few days later underwent a root canal which  was complicated with swelling and pain.  Then she underwent extraction of tooth continue to have edema/pain.  Found to have mandibular abscess and underwent debridement by ENT.  She had been on long-term clindamycin about 3 to 4 weeks at that point.  Or cultures on 5/8 grew strep intermedius.  Opted to treat with broad-spectrum Dapto and Augmentin since patient has been on clindamycin for prolonged course.  Seen by ENT Dr. Thyra Breed on 6/12.  Noted she responded well to antibiotics.  Exam demonstrated healing of surgical incision with only mild persistent perimandibular induration.  Protruding fragment from patient's previous root canal side encouraged to see dentistry.  Patient still states she has seen dentistry since then, her may need to file down her tooth workup pending. -Completed dapto+ augmentin xabout 6 weeks on 6/30 -Labs today off of abx. No concerns today form ID perspective for ongoing infection. -F./U with ID prn     #UC on Remicade(held) -Restarted Remicade   #History of bilateral nonpulsatile tinnitus - ENT recommended hearing test through her convenience.   Danelle Earthly, MD [facility] 11/07/2022, 1:53 PM   I have personally spent 51 minutes involved in face-to-face and non-face-to-face activities for this patient on the day of the visit. Professional time spent includes the following activities: Preparing to see the patient (review of tests), Obtaining and/or reviewing separately obtained history (admission/discharge record), Performing a medically appropriate examination and/or evaluation , Ordering  medications/tests/procedures, referring and communicating with other health care professionals, Documenting clinical information in the EMR, Independently interpreting results (not separately reported), Communicating results to the patient/family/caregiver, Counseling and educating the patient/family/caregiver and Care coordination  (not separately reported).

## 2022-11-08 LAB — CBC WITH DIFFERENTIAL/PLATELET
Absolute Monocytes: 672 {cells}/uL (ref 200–950)
Basophils Absolute: 74 cells/uL (ref 0–200)
Basophils Relative: 0.9 %
Eosinophils Absolute: 279 {cells}/uL (ref 15–500)
Eosinophils Relative: 3.4 %
HCT: 44.6 % (ref 35.0–45.0)
Hemoglobin: 14.6 g/dL (ref 11.7–15.5)
Lymphs Abs: 3567 {cells}/uL (ref 850–3900)
MCH: 29.8 pg (ref 27.0–33.0)
MCHC: 32.7 g/dL (ref 32.0–36.0)
MCV: 91 fL (ref 80.0–100.0)
MPV: 11.3 fL (ref 7.5–12.5)
Monocytes Relative: 8.2 %
Neutro Abs: 3608 cells/uL (ref 1500–7800)
Neutrophils Relative %: 44 %
Platelets: 295 10*3/uL (ref 140–400)
RBC: 4.9 10*6/uL (ref 3.80–5.10)
RDW: 14.3 % (ref 11.0–15.0)
Total Lymphocyte: 43.5 %
WBC: 8.2 10*3/uL (ref 3.8–10.8)

## 2022-11-08 LAB — COMPLETE METABOLIC PANEL WITH GFR
AG Ratio: 1.4 (calc) (ref 1.0–2.5)
ALT: 35 U/L — ABNORMAL HIGH (ref 6–29)
AST: 32 U/L (ref 10–35)
Albumin: 4.2 g/dL (ref 3.6–5.1)
Alkaline phosphatase (APISO): 94 U/L (ref 31–125)
BUN/Creatinine Ratio: 14 (calc) (ref 6–22)
BUN: 14 mg/dL (ref 7–25)
CO2: 26 mmol/L (ref 20–32)
Calcium: 9.7 mg/dL (ref 8.6–10.2)
Chloride: 106 mmol/L (ref 98–110)
Creat: 1.02 mg/dL — ABNORMAL HIGH (ref 0.50–0.99)
Globulin: 3 g/dL (ref 1.9–3.7)
Glucose, Bld: 92 mg/dL (ref 65–99)
Potassium: 4.1 mmol/L (ref 3.5–5.3)
Sodium: 142 mmol/L (ref 135–146)
Total Bilirubin: 0.4 mg/dL (ref 0.2–1.2)
Total Protein: 7.2 g/dL (ref 6.1–8.1)
eGFR: 67 mL/min/{1.73_m2} (ref >=60)

## 2022-11-08 LAB — C-REACTIVE PROTEIN: CRP: 5.8 mg/L (ref <8.0)

## 2022-11-08 LAB — SEDIMENTATION RATE: Sed Rate: 29 mm/h — ABNORMAL HIGH (ref 0–20)

## 2023-05-22 ENCOUNTER — Other Ambulatory Visit: Payer: Self-pay | Admitting: Family Medicine

## 2023-05-22 DIAGNOSIS — Z1231 Encounter for screening mammogram for malignant neoplasm of breast: Secondary | ICD-10-CM

## 2023-07-09 ENCOUNTER — Ambulatory Visit
Admission: RE | Admit: 2023-07-09 | Discharge: 2023-07-09 | Disposition: A | Discharge status: Home / Self Care | Payer: Self-pay | Source: Ambulatory Visit | Attending: Family Medicine | Admitting: Family Medicine

## 2023-07-09 DIAGNOSIS — Z1231 Encounter for screening mammogram for malignant neoplasm of breast: Secondary | ICD-10-CM

## 2023-08-07 ENCOUNTER — Other Ambulatory Visit (HOSPITAL_COMMUNITY)
Admission: RE | Admit: 2023-08-07 | Discharge: 2023-08-07 | Disposition: A | Discharge status: Home / Self Care | Source: Ambulatory Visit | Attending: Obstetrics and Gynecology | Admitting: Obstetrics and Gynecology

## 2023-08-07 ENCOUNTER — Encounter: Payer: Self-pay | Admitting: Obstetrics and Gynecology

## 2023-08-07 ENCOUNTER — Ambulatory Visit (INDEPENDENT_AMBULATORY_CARE_PROVIDER_SITE_OTHER): Payer: Self-pay | Admitting: Obstetrics and Gynecology

## 2023-08-07 ENCOUNTER — Ambulatory Visit: Payer: Self-pay | Admitting: Obstetrics and Gynecology

## 2023-08-07 VITALS — BP 112/64 | HR 69 | Temp 98.3°F | Ht 67.25 in | Wt 263.8 lb

## 2023-08-07 DIAGNOSIS — M8580 Other specified disorders of bone density and structure, unspecified site: Secondary | ICD-10-CM | POA: Diagnosis not present

## 2023-08-07 DIAGNOSIS — Z124 Encounter for screening for malignant neoplasm of cervix: Secondary | ICD-10-CM | POA: Insufficient documentation

## 2023-08-07 DIAGNOSIS — Z1331 Encounter for screening for depression: Secondary | ICD-10-CM

## 2023-08-07 DIAGNOSIS — N898 Other specified noninflammatory disorders of vagina: Secondary | ICD-10-CM

## 2023-08-07 DIAGNOSIS — R350 Frequency of micturition: Secondary | ICD-10-CM | POA: Diagnosis not present

## 2023-08-07 DIAGNOSIS — Z01419 Encounter for gynecological examination (general) (routine) without abnormal findings: Secondary | ICD-10-CM | POA: Diagnosis not present

## 2023-08-07 DIAGNOSIS — Z975 Presence of (intrauterine) contraceptive device: Secondary | ICD-10-CM | POA: Diagnosis not present

## 2023-08-07 DIAGNOSIS — N760 Acute vaginitis: Secondary | ICD-10-CM

## 2023-08-07 DIAGNOSIS — N951 Menopausal and female climacteric states: Secondary | ICD-10-CM

## 2023-08-07 DIAGNOSIS — F332 Major depressive disorder, recurrent severe without psychotic features: Secondary | ICD-10-CM

## 2023-08-07 DIAGNOSIS — B3731 Acute candidiasis of vulva and vagina: Secondary | ICD-10-CM

## 2023-08-07 LAB — WET PREP FOR TRICH, YEAST, CLUE

## 2023-08-07 MED ORDER — METRONIDAZOLE 500 MG PO TABS
500.0000 mg | ORAL_TABLET | Freq: Two times a day (BID) | ORAL | 0 refills | Status: AC
Start: 2023-08-07 — End: 2023-08-14

## 2023-08-07 MED ORDER — FLUCONAZOLE 150 MG PO TABS
150.0000 mg | ORAL_TABLET | Freq: Once | ORAL | 2 refills | Status: AC
Start: 2023-08-07 — End: 2023-08-07

## 2023-08-07 NOTE — Patient Instructions (Signed)

## 2023-08-07 NOTE — Progress Notes (Signed)
 53 y.o. G41P1001 female with Mirena  IUD (inserted 01/31/2016), ulcerative colitis, osteopenia here for annual exam. Married.  No LMP recorded. (Menstrual status: IUD).   Possible yeast infection, vaginal discharge, itching. Urinary urgency and frequency. She reports feeling stressed from several upcoming big life changes.  Her daughter is graduating from high school this year.  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  Her husband is taking a new job for this reason.  They are working on packing up their home of 18 years and selling it by July. She is feeling very stressed and this is affecting her mood.  She has a history of major depression and anxiety and takes Wellbutrin , Cymbalta , Seroquel .  She has added her Atarax  nightly to help with sleep. She sees her therapist 3 times a week.  Has an appointment with them tomorrow. Most of her stress is related to the increased work related to the transition and move.  She does not endorse any stressors or concerns about caring for her in-laws or moving to a new town.  Abnormal bleeding: none Pelvic discharge or pain: none Breast mass, nipple discharge or skin changes : none Birth control: Mirena  Last PAP: No results found for: "DIAGPAP", "HPVHIGH", "ADEQPAP" Last mammogram: 07/09/2023 BI-RADS 1, density B Last colonoscopy: 09/28/22, UC - 2 yr recall  DXA: 06/15/2021 osteopenia Sexually active: yes  Exercising: no Smoker: no  Garment/textile technologist Visit from 08/07/2023 in [facility]  PHQ-2 Total Score 2        PHQ-9: 16, sometimes  GYN HISTORY: No significant history  OB History  Gravida Para Term Preterm AB Living  1 1 1   0 1  SAB IAB Ectopic Multiple Live Births  0  0  1    # Outcome Date GA Lbr Len/2nd Weight Sex Type Anes PTL Lv  1 Term     M CS-Unspec   LIV   Past Medical History:  Diagnosis Date   Anxiety    Arthritis    ra    Colitis    Depression    GERD (gastroesophageal reflux disease)    Hypercholesterolemia    DR. WHITE   Migraines    PIH (pregnancy induced hypertension) yrs ago, none since   PONV (postoperative nausea and vomiting)    Wears glasses    Past Surgical History:  Procedure Laterality Date   ANAL FISSURE REPAIR N/A 04/09/2015   Procedure: ANAL CHEMICAL DENERVATION(BOTOX );  Surgeon: Shela Derby, MD;  Location: [facility] OR;  Service: General;  Laterality: N/A;   ANAL FISSURE REPAIR  07/06/2016   CATARACT EXTRACTION Left    CESAREAN SECTION  2007   COLONOSCOPY     INCISION AND DRAINAGE ABSCESS Left 07/19/2022   Procedure: INCISION AND DRAINAGE LEFT NECK ABSCESS;  Surgeon: Vernadine Golas, MD;  Location: [facility] OR;  Service: ENT;  Laterality: Left;   INTRAUTERINE DEVICE INSERTION  12/15/2005   MOUTH SURGERY     RECTAL EXAM UNDER ANESTHESIA N/A 02/17/2015   Procedure: RECTAL EXAM UNDER ANESTHESIA;  Surgeon: Shela Derby, MD;  Location: [facility] ORS;  Service: General;  Laterality: N/A;   RETINAL DETACHMENT SURGERY  2019   X 5    SPHINCTEROTOMY N/A 02/17/2015   Procedure: LATERAL INTERNAL SPHINCTEROTOMY;  Surgeon: Shela Derby, MD;  Location: [facility] ORS;  Service: General;  Laterality: N/A;   Current Outpatient Medications on File Prior to Visit  Medication Sig Dispense  Refill   acetaminophen  (TYLENOL ) 500 MG tablet Take 1,000 mg by mouth every 6 (six) hours as needed for moderate pain.     atorvastatin  (LIPITOR) 20 MG tablet Take 20 mg by mouth daily.     buPROPion  (WELLBUTRIN  XL) 150 MG 24 hr tablet Take 150 mg by mouth every morning.     Calcium  Carbonate-Vitamin D 600-400 MG-UNIT tablet Take 1 tablet by mouth at bedtime.     cetirizine (ZYRTEC) 10 MG tablet Take 10 mg by mouth daily.     DULoxetine  (CYMBALTA ) 60 MG capsule Take 1 capsule (60 mg total) by mouth daily. 30 capsule 0   hydrOXYzine  (ATARAX ) 25 MG tablet Take 1 tablet (25 mg total) by mouth 3 (three) times daily as needed for anxiety. 30  tablet 0   latanoprost  (XALATAN ) 0.005 % ophthalmic solution Place 1 drop into both eyes at bedtime.     Multiple Vitamin (MULTIVITAMIN WITH MINERALS) TABS tablet Take 1 tablet by mouth daily.     REMICADE 100 MG injection Inject into the vein.     ferrous sulfate  325 (65 FE) MG tablet Take 1 tablet (325 mg total) by mouth daily with breakfast. 90 tablet 0   gabapentin  (NEURONTIN ) 300 MG capsule Take 1 capsule (300 mg total) by mouth 3 (three) times daily. 90 capsule 0   QUEtiapine  (SEROQUEL ) 25 MG tablet Take 1 tablet (25 mg total) by mouth 3 (three) times daily. 90 tablet 0   topiramate  (TOPAMAX ) 50 MG tablet Take 1 tablet (50 mg total) by mouth every 12 (twelve) hours. 60 tablet 0   Current Facility-Administered Medications on File Prior to Visit  Medication Dose Route Frequency Provider Last Rate Last Admin   levonorgestrel  (MIRENA ) 20 MCG/24HR IUD   Intrauterine Once Davia Erps, MD       Social History   Socioeconomic History   Marital status: Married    Spouse name: Not on file   Number of children: 1   Years of education: Not on file   Highest education level: Bachelor's degree (e.g., BA, AB, BS)  Occupational History   Not on file  Tobacco Use   Smoking status: Never   Smokeless tobacco: Never  Vaping Use   Vaping status: Never Used  Substance and Sexual Activity   Alcohol use: Not Currently    Comment: occ   Drug use: Yes    Types: Marijuana   Sexual activity: Yes    Partners: Male    Birth control/protection: I.U.D.    Comment: 1st intercourse- 22, partners- 7, married- 15 yrs   Other Topics Concern   Not on file  Social History Narrative   Not on file   Social Drivers of Health   Financial Resource Strain: Not on file  Food Insecurity: Low Risk  (08/23/2022)   [facility]   Hunger Vital Sign    Worried About Running Out of Food in the Last Year: Never true    Ran Out of Food in the Last Year: Never true  Transportation  Needs: No Transportation Needs (07/22/2022)   PRAPARE - Administrator, Civil Service (Medical): No    Lack of Transportation (Non-Medical): No  Physical Activity: Not on file  Stress: Not on file  Social Connections: Not on file  Intimate Partner Violence: Not At Risk (07/22/2022)   Humiliation, Afraid, Rape, and Kick questionnaire    Fear of Current or Ex-Partner: No    Emotionally Abused: No  Physically Abused: No    Sexually Abused: No   Family History  Problem Relation Age of Onset   Anxiety disorder Mother    Depression Mother    Breast cancer Mother        mastectomy- all through the ducts of left breast    Hypertension Father    Breast cancer Maternal Aunt    Cancer Maternal Grandfather        colon?   Diabetes Paternal Grandmother    Heart disease Paternal Grandmother    Allergies  Allergen Reactions   Aspirin Other (See Comments)    Ulcerative colitis, abdominal pain   Ceftin [Cefuroxime] Other (See Comments)    Diarrhea, abdominal cramping   Codeine Other (See Comments)    Severe headaches   Lactose Other (See Comments)    Dairy - sets off my colitis   Other Rash and Other (See Comments)    Contact metal agents      PE Today's Vitals   08/07/23 0825  BP: 112/64  Pulse: 69  Temp: 98.3 F (36.8 C)  TempSrc: Oral  SpO2: 98%  Weight: 263 lb 12.8 oz (119.7 kg)  Height: 5' 7.25" (1.708 m)   Body mass index is 41.01 kg/m.  Physical Exam Vitals reviewed. Exam conducted with a chaperone present.  Constitutional:      General: Sejal is not in acute distress.    Appearance: Normal appearance.  HENT:     Head: Normocephalic and atraumatic.     Nose: Nose normal.  Eyes:     Extraocular Movements: Extraocular movements intact.     Conjunctiva/sclera: Conjunctivae normal.  Pulmonary:     Effort: Pulmonary effort is normal.  Genitourinary:    General: Normal vulva.     Exam position: Lithotomy position.     Vagina: Vaginal discharge  present.     Cervix: Normal. No cervical motion tenderness, discharge or lesion.     Uterus: Normal. Not enlarged and not tender.      Adnexa: Right adnexa normal and left adnexa normal.     Comments: IUD strings not seen (not visible since 2023), also not palpable today Musculoskeletal:        General: Normal range of motion.     Cervical back: Normal range of motion.  Neurological:     General: No focal deficit present.     Mental Status: Will is alert.  Psychiatric:        Mood and Affect: Mood normal.        Behavior: Behavior normal.       Assessment and Plan:        Well woman exam with routine gynecological exam Assessment & Plan: Cervical cancer screening performed according to ASCCP guidelines. Encouraged annual mammogram screening Colonoscopy UTD DXA due, ordered Labs and immunizations with her primary Encouraged safe sexual practices as indicated Encouraged healthy lifestyle practices with diet and exercise For patients under 50-70yo, I recommend 1200mg  calcium  daily and 600IU of vitamin D daily.   Cervical cancer screening -     Cytology - PAP  Uses hormone releasing intrauterine device (IUD) for contraception Due for removal 01/2024. Short strings, not visible since 2023, previously palpable on exam by prior providers, not palpable today.  Likely short strings. Will check [facility] today to determine if we should for reinsertion of IUD  Vaginal discharge -     WET PREP FOR TRICH, YEAST, CLUE  Osteopenia, unspecified location Assessment & Plan: Continue vitamin D+Calcium  Encouraged weight based  exercise DXA due 2025  Orders: -     DG Bone Density; Future  Urinary frequency -     Urinalysis,Complete w/RFL Culture  Perimenopause -     Follicle stimulating hormone  BV (bacterial vaginosis) -     metroNIDAZOLE ; Take 1 tablet (500 mg total) by mouth 2 (two) times daily for 7 days.  Dispense: 14 tablet; Refill: 0  Yeast vaginitis -     Fluconazole ; Take 1  tablet (150 mg total) by mouth once for 1 dose. May repeat dose in 48 hours if needed.  Dispense: 1 tablet; Refill: 2  IUD (intrauterine device) in place Assessment & Plan: Due for removal 01/2024 Will check [facility] today to determine if we should for reinsertion of IUD  MDD (major depressive disorder), recurrent severe, without psychosis (HCC) Assessment & Plan: Worsening symptoms in setting of several life stressors Continue medical treatment and counseling Encourage patient to consider increasing counseling sessions to weekly or every 2 weeks at this time.   Romaine Closs, MD

## 2023-08-07 NOTE — Assessment & Plan Note (Signed)
 Cervical cancer screening performed according to ASCCP guidelines. Encouraged annual mammogram screening Colonoscopy UTD DXA due, ordered Labs and immunizations with her primary Encouraged safe sexual practices as indicated Encouraged healthy lifestyle practices with diet and exercise For patients under 50-51yo, I recommend 1200mg  calcium daily and 600IU of vitamin D  daily.

## 2023-08-07 NOTE — Assessment & Plan Note (Signed)
 Due for removal 01/2024 Will check [facility] today to determine if we should for reinsertion of IUD

## 2023-08-07 NOTE — Assessment & Plan Note (Signed)
 Continue vitamin D+Calcium Encouraged weight based exercise DXA due 2025

## 2023-08-07 NOTE — Assessment & Plan Note (Signed)
 Worsening symptoms in setting of several life stressors Continue medical treatment and counseling Encourage patient to consider increasing counseling sessions to weekly or every 2 weeks at this time.

## 2023-08-08 LAB — URINALYSIS, COMPLETE W/RFL CULTURE
Bilirubin Urine: NEGATIVE
Glucose, UA: NEGATIVE
Hgb urine dipstick: NEGATIVE
Hyaline Cast: NONE SEEN /LPF
Nitrites, Initial: NEGATIVE
Protein, ur: NEGATIVE
RBC / HPF: NONE SEEN /HPF (ref 0–2)
Specific Gravity, Urine: 1.023 (ref 1.001–1.035)
pH: 5.5 (ref 5.0–8.0)

## 2023-08-08 LAB — URINE CULTURE
MICRO NUMBER:: 16501846
Result:: NO GROWTH
SPECIMEN QUALITY:: ADEQUATE

## 2023-08-08 LAB — CULTURE INDICATED

## 2023-08-08 LAB — FOLLICLE STIMULATING HORMONE: FSH: 16.9 m[IU]/mL

## 2023-08-09 LAB — CYTOLOGY - PAP
Adequacy: ABSENT
Comment: NEGATIVE
Diagnosis: NEGATIVE
High risk HPV: NEGATIVE

## 2024-01-14 ENCOUNTER — Encounter: Payer: Self-pay | Admitting: Obstetrics and Gynecology

## 2024-01-14 ENCOUNTER — Ambulatory Visit: Admitting: Obstetrics and Gynecology

## 2024-01-14 VITALS — BP 98/64 | HR 68 | Temp 97.6°F | Wt 258.0 lb

## 2024-01-14 DIAGNOSIS — Z30433 Encounter for removal and reinsertion of intrauterine contraceptive device: Secondary | ICD-10-CM | POA: Diagnosis not present

## 2024-01-14 DIAGNOSIS — Z01812 Encounter for preprocedural laboratory examination: Secondary | ICD-10-CM

## 2024-01-14 LAB — PREGNANCY, URINE: Preg Test, Ur: NEGATIVE

## 2024-01-14 MED ORDER — ACETAMINOPHEN 500 MG PO TABS: 650.0000 mg | ORAL_TABLET | Freq: Once | ORAL | Status: DC

## 2024-01-14 MED ORDER — LIDOCAINE HCL URETHRAL/MUCOSAL 2 % EX GEL: 1.0000 | Freq: Once | CUTANEOUS | Status: DC

## 2024-01-14 MED ORDER — LEVONORGESTREL 20 MCG/DAY IU IUD
1.0000 | INTRAUTERINE_SYSTEM | Freq: Once | INTRAUTERINE | Status: AC
Start: 2024-01-14 — End: 2024-01-14
  Administered 2024-01-14: 1 via INTRAUTERINE

## 2024-01-14 NOTE — Progress Notes (Signed)
 53 y.o. G28P1001 female with Mirena  IUD (inserted 01/31/2016), ulcerative colitis, osteopenia  here for IUD removal and re insertion, type:  Mirena . Married.   No LMP recorded. (Menstrual status: IUD).   Normal FSH at annual exam. Did not take tylenol  today Selling home and planning to move to [place] to care for inlaws  OB History  Gravida Para Term Preterm AB Living  1 1 1   0 1  SAB IAB Ectopic Multiple Live Births  0  0  1    # Outcome Date GA Lbr Len/2nd Weight Sex Type Anes PTL Lv  1 Term     M CS-Unspec   LIV   Past Medical History:  Diagnosis Date   Anxiety    Arthritis    ra   Colitis    Depression    GERD (gastroesophageal reflux disease)    Hypercholesterolemia    DR. WHITE   Migraines    PIH (pregnancy induced hypertension) yrs ago, none since   PONV (postoperative nausea and vomiting)    Wears glasses    Past Surgical History:  Procedure Laterality Date   ANAL FISSURE REPAIR N/A 04/09/2015   Procedure: ANAL CHEMICAL DENERVATION(BOTOX );  Surgeon: Lynda Leos, MD;  Location: [facility] OR;  Service: General;  Laterality: N/A;   ANAL FISSURE REPAIR  07/06/2016   CATARACT EXTRACTION Left    CESAREAN SECTION  2007   COLONOSCOPY     INCISION AND DRAINAGE ABSCESS Left 07/19/2022   Procedure: INCISION AND DRAINAGE LEFT NECK ABSCESS;  Surgeon: Roark Rush, MD;  Location: [facility] OR;  Service: ENT;  Laterality: Left;   INTRAUTERINE DEVICE INSERTION  12/15/2005   MOUTH SURGERY     RECTAL EXAM UNDER ANESTHESIA N/A 02/17/2015   Procedure: RECTAL EXAM UNDER ANESTHESIA;  Surgeon: Lynda Leos, MD;  Location: [facility] ORS;  Service: General;  Laterality: N/A;   RETINAL DETACHMENT SURGERY  2019   X 5    SPHINCTEROTOMY N/A 02/17/2015   Procedure: LATERAL INTERNAL SPHINCTEROTOMY;  Surgeon: Lynda Leos, MD;  Location: [facility] ORS;  Service: General;  Laterality: N/A;   Current Outpatient Medications on File Prior to Visit  Medication Sig Dispense Refill   acetaminophen  (TYLENOL )  500 MG tablet Take 1,000 mg by mouth every 6 (six) hours as needed for moderate pain.     atorvastatin  (LIPITOR) 20 MG tablet Take 20 mg by mouth daily.     buPROPion  (WELLBUTRIN  XL) 150 MG 24 hr tablet Take 150 mg by mouth every morning.     Calcium  Carbonate-Vitamin D 600-400 MG-UNIT tablet Take 1 tablet by mouth at bedtime.     cetirizine (ZYRTEC) 10 MG tablet Take 10 mg by mouth daily.     DULoxetine  (CYMBALTA ) 60 MG capsule Take 1 capsule (60 mg total) by mouth daily. 30 capsule 0   gabapentin  (NEURONTIN ) 300 MG capsule Take 1 capsule (300 mg total) by mouth 3 (three) times daily. 90 capsule 0   hydrOXYzine  (ATARAX ) 25 MG tablet Take 1 tablet (25 mg total) by mouth 3 (three) times daily as needed for anxiety. 30 tablet 0   latanoprost  (XALATAN ) 0.005 % ophthalmic solution Place 1 drop into both eyes at bedtime.     Multiple Vitamin (MULTIVITAMIN WITH MINERALS) TABS tablet Take 1 tablet by mouth daily.     QUEtiapine  (SEROQUEL ) 25 MG tablet Take 1 tablet (25 mg total) by mouth 3 (three) times daily. 90 tablet 0   REMICADE 100 MG injection Inject into the vein.  topiramate  (TOPAMAX ) 50 MG tablet Take 1 tablet (50 mg total) by mouth every 12 (twelve) hours. 60 tablet 0   ferrous sulfate  325 (65 FE) MG tablet Take 1 tablet (325 mg total) by mouth daily with breakfast. (Patient not taking: Reported on 01/14/2024) 90 tablet 0   No current facility-administered medications on file prior to visit.   Allergies  Allergen Reactions   Aspirin Other (See Comments)    Ulcerative colitis, abdominal pain   Ceftin [Cefuroxime] Other (See Comments)    Diarrhea, abdominal cramping   Codeine Other (See Comments)    Severe headaches   Lactose Other (See Comments)    Dairy - sets off my colitis   Other Rash and Other (See Comments)    Contact metal agents      PE Today's Vitals   01/14/24 1357 01/14/24 1447  BP: 100/62 98/64  Pulse: 86 68  Temp: 97.6 F (36.4 C)   TempSrc: Oral   SpO2: 97%  99%  Weight: 258 lb (117 kg)    Body mass index is 40.11 kg/m.  Physical Exam Vitals reviewed. Exam conducted with a chaperone present.  Constitutional:      General: Rebekah Silva is not in acute distress.    Appearance: Normal appearance.  HENT:     Head: Normocephalic and atraumatic.     Nose: Nose normal.  Eyes:     Extraocular Movements: Extraocular movements intact.     Conjunctiva/sclera: Conjunctivae normal.  Pulmonary:     Effort: Pulmonary effort is normal.  Genitourinary:    General: Normal vulva.     Exam position: Lithotomy position.     Vagina: Normal. No vaginal discharge.     Cervix: Normal. No cervical motion tenderness, discharge or lesion.     Uterus: Normal. Not enlarged and not tender.      Adnexa: Right adnexa normal and left adnexa normal.     Comments: IUD strings were visualized Musculoskeletal:        General: Normal range of motion.     Cervical back: Normal range of motion.  Neurological:     General: No focal deficit present.     Mental Status: Rebekah Silva is alert.  Psychiatric:        Mood and Affect: Mood normal.        Behavior: Behavior normal.     Procedure:   Consent was signed. Timeout was performed. Speculum inserted.  2% glydo applied to cervix (lot [id number], exp 20 27-0 4). Speculum removed, and patient given at leat 10 mins prior to proceeding with procedure. Speculum was then re-inserted.  Cervix visualized and cleansed with Hibiclens  x 3.  IUD was removed intact using forceps. Tenaculum placed on anterior cervix. Then uterus sounded to 7 cm (previously measured 9cm on TVUS).  Mirena  IUD (Lot#: [id number], Exp 2027/NOV) was placed according to manufacturer's guidelines. Strings trimmed to 2 cm.  No bleeding noted.  Pt had moderate right sided pain following insertion and started feeling faint. She was monitored in dorsal lithotomy position until her symptoms improved. She tolerated water fine and was given tylenol  for pain. VS remained stable.     Assessment and Plan:        Encounter for removal and reinsertion of intrauterine contraceptive device (IUD) -     lidocaine  -     Levonorgestrel  -     Acetaminophen   Pre-procedure lab exam -     Pregnancy, urine    Instructions and precautions given.  Follow up in  4-6 weeks, sooner as needed. IUD product card given to patient with date of removal and lot number. To call if right sided pain persists. Would recommend TVUS at that time.  Vera LULLA Pa, MD

## 2024-02-26 ENCOUNTER — Ambulatory Visit: Admitting: Obstetrics and Gynecology

## 2024-02-28 ENCOUNTER — Ambulatory Visit: Admitting: Obstetrics and Gynecology

## 2024-02-28 ENCOUNTER — Encounter: Payer: Self-pay | Admitting: Obstetrics and Gynecology

## 2024-02-28 VITALS — BP 104/78 | HR 64 | Temp 98.0°F | Wt 259.0 lb

## 2024-02-28 DIAGNOSIS — N952 Postmenopausal atrophic vaginitis: Secondary | ICD-10-CM | POA: Diagnosis not present

## 2024-02-28 DIAGNOSIS — Z30431 Encounter for routine checking of intrauterine contraceptive device: Secondary | ICD-10-CM | POA: Diagnosis not present

## 2024-02-28 DIAGNOSIS — Z975 Presence of (intrauterine) contraceptive device: Secondary | ICD-10-CM

## 2024-02-28 DIAGNOSIS — B9689 Other specified bacterial agents as the cause of diseases classified elsewhere: Secondary | ICD-10-CM

## 2024-02-28 DIAGNOSIS — N760 Acute vaginitis: Secondary | ICD-10-CM | POA: Diagnosis not present

## 2024-02-28 DIAGNOSIS — B3731 Acute candidiasis of vulva and vagina: Secondary | ICD-10-CM | POA: Diagnosis not present

## 2024-02-28 DIAGNOSIS — N898 Other specified noninflammatory disorders of vagina: Secondary | ICD-10-CM

## 2024-02-28 LAB — WET PREP FOR TRICH, YEAST, CLUE

## 2024-02-28 MED ORDER — METRONIDAZOLE 500 MG PO TABS: 500.0000 mg | ORAL_TABLET | Freq: Two times a day (BID) | ORAL | 0 refills | Status: AC

## 2024-02-28 MED ORDER — FLUCONAZOLE 150 MG PO TABS: 150.0000 mg | ORAL_TABLET | Freq: Once | ORAL | 2 refills | Status: AC

## 2024-02-28 MED ORDER — ESTRADIOL 0.01 % VA CREA: TOPICAL_CREAM | VAGINAL | 1 refills | Status: AC

## 2024-02-28 NOTE — Progress Notes (Signed)
 53 y.o. G59P1001 female with Mirena  IUD (inserted 01/14/2024), ulcerative colitis, osteopenia here for string check. Married.   No LMP recorded. (Menstrual status: IUD).   Normal FSH at annual exam. No VB or pelvic pain. +vaginal discharge- recurrent yeast after IC, last SA 2 days ago Moved to [place] to care for inlaws last week, so planning to transfer care  OB History  Gravida Para Term Preterm AB Living  1 1 1   0 1  SAB IAB Ectopic Multiple Live Births  0  0  1    # Outcome Date GA Lbr Len/2nd Weight Sex Type Anes PTL Lv  1 Term     M CS-Unspec   LIV   Past Medical History:  Diagnosis Date   Anxiety    Arthritis    ra   Colitis    Depression    GERD (gastroesophageal reflux disease)    Hypercholesterolemia    DR. WHITE   Migraines    PIH (pregnancy induced hypertension) yrs ago, none since   PONV (postoperative nausea and vomiting)    Wears glasses    Past Surgical History:  Procedure Laterality Date   ANAL FISSURE REPAIR N/A 04/09/2015   Procedure: ANAL CHEMICAL DENERVATION(BOTOX );  Surgeon: Lynda Leos, MD;  Location: [facility] OR;  Service: General;  Laterality: N/A;   ANAL FISSURE REPAIR  07/06/2016   CATARACT EXTRACTION Left    CESAREAN SECTION  2007   COLONOSCOPY     INCISION AND DRAINAGE ABSCESS Left 07/19/2022   Procedure: INCISION AND DRAINAGE LEFT NECK ABSCESS;  Surgeon: Roark Rush, MD;  Location: [facility] OR;  Service: ENT;  Laterality: Left;   INTRAUTERINE DEVICE INSERTION  12/15/2005   MOUTH SURGERY     RECTAL EXAM UNDER ANESTHESIA N/A 02/17/2015   Procedure: RECTAL EXAM UNDER ANESTHESIA;  Surgeon: Lynda Leos, MD;  Location: [facility] ORS;  Service: General;  Laterality: N/A;   RETINAL DETACHMENT SURGERY  2019   X 5    SPHINCTEROTOMY N/A 02/17/2015   Procedure: LATERAL INTERNAL SPHINCTEROTOMY;  Surgeon: Lynda Leos, MD;  Location: [facility] ORS;  Service: General;  Laterality: N/A;   Current Outpatient Medications on File Prior to Visit  Medication Sig  Dispense Refill   acetaminophen  (TYLENOL ) 500 MG tablet Take 1,000 mg by mouth every 6 (six) hours as needed for moderate pain.     atorvastatin  (LIPITOR) 20 MG tablet Take 20 mg by mouth daily.     buPROPion  (WELLBUTRIN  XL) 150 MG 24 hr tablet Take 150 mg by mouth every morning. (Patient taking differently: Take 300 mg by mouth every morning.)     Calcium  Carbonate-Vitamin D 600-400 MG-UNIT tablet Take 1 tablet by mouth at bedtime.     cetirizine (ZYRTEC) 10 MG tablet Take 10 mg by mouth daily.     DULoxetine  (CYMBALTA ) 60 MG capsule Take 1 capsule (60 mg total) by mouth daily. 30 capsule 0   gabapentin  (NEURONTIN ) 300 MG capsule Take 1 capsule (300 mg total) by mouth 3 (three) times daily. 90 capsule 0   hydrOXYzine  (ATARAX ) 50 MG tablet Take by mouth 3 (three) times daily as needed for anxiety.     latanoprost  (XALATAN ) 0.005 % ophthalmic solution Place 1 drop into both eyes at bedtime.     levonorgestrel  (MIRENA ) 20 MCG/DAY IUD 1 each by Intrauterine route once.     Multiple Vitamin (MULTIVITAMIN WITH MINERALS) TABS tablet Take 1 tablet by mouth daily.     REMICADE 100 MG injection Inject into the vein.  rizatriptan (MAXALT-MLT) 10 MG disintegrating tablet Take by mouth as needed.     topiramate  (TOPAMAX ) 50 MG tablet Take 1 tablet (50 mg total) by mouth every 12 (twelve) hours. 60 tablet 0   No current facility-administered medications on file prior to visit.   Allergies  Allergen Reactions   Aspirin Other (See Comments)    Ulcerative colitis, abdominal pain   Ceftin [Cefuroxime] Other (See Comments)    Diarrhea, abdominal cramping   Codeine Other (See Comments)    Severe headaches   Lactose Other (See Comments)    Dairy - sets off my colitis   Other Rash and Other (See Comments)    Contact metal agents      PE Today's Vitals   02/28/24 1158  BP: 104/78  Pulse: 64  Temp: 98 F (36.7 C)  TempSrc: Oral  SpO2: 97%  Weight: 259 lb (117.5 kg)    Body mass index is 40.26  kg/m.  Physical Exam Vitals reviewed. Exam conducted with a chaperone present.  Constitutional:      General: Larna is not in acute distress.    Appearance: Normal appearance.  HENT:     Head: Normocephalic and atraumatic.     Nose: Nose normal.  Eyes:     Extraocular Movements: Extraocular movements intact.     Conjunctiva/sclera: Conjunctivae normal.  Pulmonary:     Effort: Pulmonary effort is normal.  Genitourinary:    General: Normal vulva.     Exam position: Lithotomy position.     Vagina: Normal. No vaginal discharge.     Cervix: Normal. No cervical motion tenderness, discharge or lesion.     Uterus: Normal. Not enlarged and not tender.      Adnexa: Right adnexa normal and left adnexa normal.     Comments: IUD strings were visualized Musculoskeletal:        General: Normal range of motion.     Cervical back: Normal range of motion.  Neurological:     General: No focal deficit present.     Mental Status: Fatima is alert.  Psychiatric:        Mood and Affect: Mood normal.        Behavior: Behavior normal.     Assessment and Plan:        Uses hormone releasing intrauterine device (IUD) for contraception IUD check up Normal string check  Vaginal discharge -     WET PREP FOR TRICH, YEAST, CLUE  Bacterial vaginosis -     metroNIDAZOLE ; Take 1 tablet (500 mg total) by mouth 2 (two) times daily for 7 days.  Dispense: 14 tablet; Refill: 0  Yeast vaginitis -     Fluconazole ; Take 1 tablet (150 mg total) by mouth once for 1 dose.  Dispense: 1 tablet; Refill: 2  Vaginal atrophy -     Estradiol ; Apply 0.5g to vulva nightly for 2 weeks then 2 times a week. Do not use applicator.  Dispense: 42.5 g; Refill: 1  Recommend starting vaginal estrogen given recurrent infections in perimenopause  Planning to transfer care to OBGYN in [place] Vera LULLA Dallie, MD

## 2024-07-11 ENCOUNTER — Encounter

## 2024-07-11 DIAGNOSIS — Z1231 Encounter for screening mammogram for malignant neoplasm of breast: Secondary | ICD-10-CM

## 2024-08-07 ENCOUNTER — Ambulatory Visit: Admitting: Obstetrics and Gynecology
